# Patient Record
Sex: Male | Born: 1960 | Race: White | Hispanic: No | Marital: Single
Health system: Southern US, Community
[De-identification: ages and names within clinical notes are randomized; demographics above are authoritative.]

## PROBLEM LIST (undated history)

## (undated) DIAGNOSIS — M549 Dorsalgia, unspecified: Secondary | ICD-10-CM

## (undated) DIAGNOSIS — F1910 Other psychoactive substance abuse, uncomplicated: Secondary | ICD-10-CM

## (undated) DIAGNOSIS — Z972 Presence of dental prosthetic device (complete) (partial): Secondary | ICD-10-CM

## (undated) DIAGNOSIS — B1920 Unspecified viral hepatitis C without hepatic coma: Secondary | ICD-10-CM

## (undated) DIAGNOSIS — F329 Major depressive disorder, single episode, unspecified: Secondary | ICD-10-CM

## (undated) DIAGNOSIS — M5430 Sciatica, unspecified side: Secondary | ICD-10-CM

## (undated) DIAGNOSIS — S8263XA Displaced fracture of lateral malleolus of unspecified fibula, initial encounter for closed fracture: Secondary | ICD-10-CM

## (undated) DIAGNOSIS — F419 Anxiety disorder, unspecified: Secondary | ICD-10-CM

## (undated) DIAGNOSIS — R519 Headache, unspecified: Secondary | ICD-10-CM

## (undated) DIAGNOSIS — R51 Headache: Secondary | ICD-10-CM

## (undated) DIAGNOSIS — I639 Cerebral infarction, unspecified: Secondary | ICD-10-CM

## (undated) DIAGNOSIS — G8929 Other chronic pain: Secondary | ICD-10-CM

## (undated) DIAGNOSIS — F32A Depression, unspecified: Secondary | ICD-10-CM

## (undated) DIAGNOSIS — I2510 Atherosclerotic heart disease of native coronary artery without angina pectoris: Secondary | ICD-10-CM

## (undated) DIAGNOSIS — I10 Essential (primary) hypertension: Secondary | ICD-10-CM

## (undated) DIAGNOSIS — K08109 Complete loss of teeth, unspecified cause, unspecified class: Secondary | ICD-10-CM

## (undated) HISTORY — PX: KNEE ARTHROSCOPY: SHX127

## (undated) HISTORY — DX: Unspecified viral hepatitis C without hepatic coma: B19.20

## (undated) HISTORY — PX: ENDARTERECTOMY: SHX5162

## (undated) HISTORY — PX: APPENDECTOMY: SHX54

## (undated) HISTORY — PX: INGUINAL HERNIA REPAIR: SUR1180

## (undated) HISTORY — PX: LUMBAR LAMINECTOMY: SHX95

## (undated) HISTORY — PX: ORCHIOPEXY: SHX479

---
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
## 2000-05-25 ENCOUNTER — Encounter: Payer: Self-pay | Admitting: Emergency Medicine

## 2000-05-25 ENCOUNTER — Emergency Department (HOSPITAL_COMMUNITY): Admission: EM | Admit: 2000-05-25 | Discharge: 2000-05-25 | Payer: Self-pay | Admitting: Unknown Physician Specialty

## 2000-05-25 ENCOUNTER — Encounter: Payer: Self-pay | Admitting: *Deleted

## 2000-06-01 ENCOUNTER — Emergency Department (HOSPITAL_COMMUNITY): Admission: EM | Admit: 2000-06-01 | Discharge: 2000-06-01 | Payer: Self-pay | Admitting: Emergency Medicine

## 2000-11-09 ENCOUNTER — Emergency Department (HOSPITAL_COMMUNITY): Admission: EM | Admit: 2000-11-09 | Discharge: 2000-11-09 | Payer: Self-pay | Admitting: Emergency Medicine

## 2000-11-11 ENCOUNTER — Emergency Department (HOSPITAL_COMMUNITY): Admission: EM | Admit: 2000-11-11 | Discharge: 2000-11-11 | Payer: Self-pay | Admitting: Emergency Medicine

## 2005-09-09 ENCOUNTER — Emergency Department (HOSPITAL_COMMUNITY): Admission: EM | Admit: 2005-09-09 | Discharge: 2005-09-10 | Payer: Self-pay | Admitting: Emergency Medicine

## 2005-11-12 ENCOUNTER — Emergency Department (HOSPITAL_COMMUNITY): Admission: EM | Admit: 2005-11-12 | Discharge: 2005-11-12 | Payer: Self-pay | Admitting: Emergency Medicine

## 2008-07-25 ENCOUNTER — Emergency Department (HOSPITAL_COMMUNITY): Admission: EM | Admit: 2008-07-25 | Discharge: 2008-07-25 | Payer: Self-pay | Admitting: Emergency Medicine

## 2009-01-16 ENCOUNTER — Emergency Department (HOSPITAL_COMMUNITY): Admission: EM | Admit: 2009-01-16 | Discharge: 2009-01-16 | Payer: Self-pay | Admitting: Emergency Medicine

## 2010-09-29 LAB — URINALYSIS, ROUTINE W REFLEX MICROSCOPIC
Bilirubin Urine: NEGATIVE
Glucose, UA: NEGATIVE mg/dL
Hgb urine dipstick: NEGATIVE
Ketones, ur: NEGATIVE mg/dL
Nitrite: NEGATIVE
Protein, ur: NEGATIVE mg/dL
Specific Gravity, Urine: 1.015 (ref 1.005–1.030)
Urobilinogen, UA: 0.2 mg/dL (ref 0.0–1.0)
pH: 5 (ref 5.0–8.0)

## 2010-09-29 LAB — POCT I-STAT, CHEM 8
BUN: 7 mg/dL (ref 6–23)
Calcium, Ion: 1.09 mmol/L — ABNORMAL LOW (ref 1.12–1.32)
Chloride: 105 mEq/L (ref 96–112)
Creatinine, Ser: 1.2 mg/dL (ref 0.4–1.5)
Glucose, Bld: 114 mg/dL — ABNORMAL HIGH (ref 70–99)
HCT: 53 % — ABNORMAL HIGH (ref 39.0–52.0)
Hemoglobin: 18 g/dL — ABNORMAL HIGH (ref 13.0–17.0)
Potassium: 3.8 mEq/L (ref 3.5–5.1)
Sodium: 142 mEq/L (ref 135–145)
TCO2: 24 mmol/L (ref 0–100)

## 2010-11-28 ENCOUNTER — Emergency Department (HOSPITAL_COMMUNITY)
Admission: EM | Admit: 2010-11-28 | Discharge: 2010-11-28 | Disposition: A | Discharge status: Home / Self Care | Payer: Self-pay | Attending: Emergency Medicine | Admitting: Emergency Medicine

## 2010-11-28 DIAGNOSIS — M545 Low back pain, unspecified: Secondary | ICD-10-CM | POA: Insufficient documentation

## 2010-11-28 DIAGNOSIS — IMO0002 Reserved for concepts with insufficient information to code with codable children: Secondary | ICD-10-CM | POA: Insufficient documentation

## 2010-12-01 ENCOUNTER — Emergency Department (HOSPITAL_COMMUNITY): Payer: Self-pay

## 2010-12-01 ENCOUNTER — Inpatient Hospital Stay (HOSPITAL_COMMUNITY)
Admission: EM | Admit: 2010-12-01 | Discharge: 2010-12-03 | DRG: 491 | Disposition: A | Discharge status: Home / Self Care | Payer: Self-pay | Attending: Neurosurgery | Admitting: Neurosurgery

## 2010-12-01 DIAGNOSIS — M5126 Other intervertebral disc displacement, lumbar region: Principal | ICD-10-CM | POA: Diagnosis present

## 2010-12-01 DIAGNOSIS — F17200 Nicotine dependence, unspecified, uncomplicated: Secondary | ICD-10-CM | POA: Diagnosis present

## 2010-12-01 DIAGNOSIS — M47817 Spondylosis without myelopathy or radiculopathy, lumbosacral region: Secondary | ICD-10-CM | POA: Diagnosis present

## 2010-12-01 LAB — BASIC METABOLIC PANEL
Calcium: 9.3 mg/dL (ref 8.4–10.5)
GFR calc Af Amer: >60 mL/min (ref >60)
GFR calc non Af Amer: >60 mL/min (ref >60)
Glucose, Bld: 130 mg/dL — ABNORMAL HIGH (ref 70–99)
Potassium: 4.6 mEq/L (ref 3.5–5.1)
Sodium: 140 mEq/L (ref 135–145)

## 2010-12-01 LAB — DIFFERENTIAL
Basophils Absolute: 0 10*3/uL (ref 0.0–0.1)
Basophils Relative: 0 % (ref 0–1)
Eosinophils Absolute: 0.1 10*3/uL (ref 0.0–0.7)
Eosinophils Relative: 0 % (ref 0–5)
Monocytes Absolute: 0.2 10*3/uL (ref 0.1–1.0)
Monocytes Relative: 1 % — ABNORMAL LOW (ref 3–12)
Neutro Abs: 13 10*3/uL — ABNORMAL HIGH (ref 1.7–7.7)

## 2010-12-01 LAB — CBC
Hemoglobin: 18.1 g/dL — ABNORMAL HIGH (ref 13.0–17.0)
MCH: 33 pg (ref 26.0–34.0)
MCHC: 36.3 g/dL — ABNORMAL HIGH (ref 30.0–36.0)
Platelets: 240 10*3/uL (ref 150–400)
RDW: 12.8 % (ref 11.5–15.5)

## 2010-12-02 ENCOUNTER — Inpatient Hospital Stay (HOSPITAL_COMMUNITY): Payer: Self-pay

## 2010-12-02 LAB — MRSA PCR SCREENING: MRSA by PCR: NEGATIVE

## 2010-12-04 NOTE — Op Note (Signed)
NAMEKHARSON, RASMUSSON NO.:  [id number]  MEDICAL RECORD NO.:  [id number]  LOCATION:  3039                         FACILITY:  [facility]  PHYSICIAN:  Hewitt Shorts, M.D.DATE OF BIRTH:  11-21-1960  DATE OF PROCEDURE:  12/02/2010 DATE OF DISCHARGE:                              OPERATIVE REPORT   PREOPERATIVE DIAGNOSES:  Recurrent left L5 lumbar disk herniation, lumbar degenerative disk disease, lumbar spondylosis and lumbar radiculopathy.  POSTOPERATIVE DIAGNOSES:  Recurrent left L5 lumbar disk herniation, lumbar degenerative disk disease, lumbar spondylosis and lumbar radiculopathy.  PROCEDURES:  Inferior left L4 hemilaminotomy and complete left L5 hemilaminectomy with microdiskectomy with microdissection under microsurgical technique with decompression of the exiting left L5 and S1 nerve roots.  SURGEON:  Hewitt Shorts, MD  ASSISTANT:  Hilda Lias, MD  ANESTHESIA:  General endotracheal.  INDICATIONS:  The patient is a 50 year old man who had a previous lumbar diskectomy done at the [facility] 11 years ago.  He developed recurrent radicular pain over the past couple of weeks, it has become steadily more severe.  He was found by MRI scan to have a large disk herniation with a free fragment behind the body of L5 on the left side.  There was extensive postsurgical changes in the left L4-5 level and possible previous laminotomy at the L5-S1 level.  Decision was made to proceed with decompression.  PROCEDURE IN DETAILS:  The patient was brought to the operating room and placed under general endotracheal anesthesia.  The patient was turned to a prone position.  Lumbar region was prepped with Betadine soap and solution and draped in a sterile fashion.  The midline was infiltrated with local anesthetic with epinephrine and then the previous midline incision was reopened and dissection was carried down through the subcutaneous  tissue.  Bipolar cautery and electrocautery were used to maintain hemostasis.  Dissection was carried down to the lumbar fascia which was sized on the left side of the midline and the paraspinal muscle was dissected from the spinous process and lamina in subperiosteal fashion.  There was extensive scar tissue in the interlaminar space, worse at L4-5 and L5-S1.  However, we were able to define the lamina, spinous process, L4, L5 and S1.  An x-ray was taken and the L4-5 and L5-S1 interlaminar space was confirmed.  The operating microscope was then draped and brought into the field to provide additional navigation, illumination and visualization and the remainder of decompression performed using microdissection under microsurgical technique.  We were able to dissect the scar tissue and ligamentum flavum from the margins of previous laminotomy and laminar edges and then using the high-speed drill and Kerrison punches, we proceeded with an inferior left L4 hemilaminotomy and a complete left L5 hemilaminectomy.  We began to remove the remaining ligamentum flavum at the L5-S1 level and then extended the hemilaminectomy rostrally, identified the thecal sac, the exiting left S1 nerve root and the exiting left L5 nerve roots.  We then dissected the scar tissue at the left L4-5 laminotomy from the thecal sac and defined the thecal sac rostral to the L4-5 disk space level.  We then found a very large free fragment  caudal to the left L5 nerve roots.  It was removed in several large fragments and good decompression of thecal sac and nerve root was achieved.  We examined the left L5 nerve root, it has been well decompressed the L5-S1 disk itself, although degenerated, did not have any evidence of herniation.  We then began to explore the epidural space at the left L4-5 level.  Remaining disk herniation was removed and we proceeded to incise the annulus and scar of the L4-5 disk and entered into the  disk space, proceeded with a thorough diskectomy using a variety of pituitary rongeurs and micro curettes.  Thorough diskectomy was performed.  We were then removed thickened ligamentum flavum and scar tissue from the posterior aspect of the vertebral body at L4 and L5 and it was felt that good decompression of the thecal sac and L5 and S1 nerve roots have been achieved on the left side.  All loose fragments of the disk material were removed from the disk space and the epidural space.  Once decompression was completed, hemostasis was established with the use of Gelfoam soaked in thrombin.  The wound was irrigated with bacitracin solution.  The Gelfoam removed.  Good hemostasis confirmed and then we instilled 2 mL of fentanyl and 80 mg of Depo-Medrol into the epidural space and proceed with closure.  Deep fascia was closed with interrupted undyed 1 Vicryl sutures.  Scarpa fascia was closed with interrupted undyed 1 Vicryl sutures.  Subcutaneous and subcuticular closed with inverted 2-0 undyed Vicryl sutures and the skin was approximated with Dermabond.  The procedure was tolerated well.  The estimated blood loss was 50 mL.  Sponge and needle count were correct. Following surgery, the patient was turned back to supine position to be reversed in the anesthetic, extubated, was transferred to the recovery room for further care.     Hewitt Shorts, M.D.     RWN/MEDQ  D:  12/02/2010  T:  12/03/2010  Job:  [id number]  Electronically Signed by Shirlean Kelly M.D. on 12/04/2010 10:48:02 AM

## 2010-12-04 NOTE — H&P (Signed)
NAMEGLENWOOD, REVOIR NO.:  [id number]  MEDICAL RECORD NO.:  [id number]  LOCATION:  3039                         FACILITY:  [facility]  PHYSICIAN:  Hewitt Shorts, M.D.DATE OF BIRTH:  1961-02-17  DATE OF ADMISSION:  12/01/2010 DATE OF DISCHARGE:                             HISTORY & PHYSICAL   HISTORY OF PRESENT ILLNESS:  The patient is a 50 year old white male who presented to [facility] Emergency Room with disabling left lumbar radiculopathy.  He had presented 3 days ago with the same complaints.  Today, the emergency room physicians and physician assistant obtained an MRI scan of the lumbar spine which revealed a large (recurrent) left L5 lumbar disk herniation.  The disk herniation extends from the L4-5 disk level to the L5-S1 disk level and the MRI suggested previous laminotomy on the left side of L4-5.  Notably, his history includes a left lumbar diskectomy in 2001 at [facility].  No records are available regarding that condition.  The patient explains that he had some mild chronic back pain for years. His current difficulties now began 2 weeks ago with worsening of his pain; however, he developed acute severe left lumbar radicular pain on June 7.  He came to the emergency room.  He was seen by the emergency room physician, prescribed Percocet and referred to a couple of primary care practices.  He contacted them, neither of them would give him an appointment.  He ran out of his Percocet yesterday.  He returned to the emergency room because of pain, worsened again today and his MRI was done and neurosurgical consultation was requested.  He describes pain which is mild in the left side of low back but severe extending from the left buttock, down to left lateral thigh and leg.  He has numbness in the lateral left leg and into the dorsal left foot and into the left great toe.  PAST MEDICAL HISTORY:  No history of  hypertension, cancer, myocardial function, peptic ulcer disease, stroke, lung disease or diabetes.  PREVIOUS SURGERY:  Includes his lumbar diskectomy in 2001, right inguinal herniorrhaphy on 2 occasions with an orchiopexy and appendectomy.  ALLERGIES:  He denies allergies to medications.  MEDICATIONS:  He is on no chronic medications, but did use Percocet prescribed from the emergency room on June 7 until it ran out yesterday.  FAMILY HISTORY:  Mother is alive at age 44 with diabetes.  Father died at age 53 and had ruptured abdominal aortic aneurysm.  He had a remote history of peptic ulcer disease.  SOCIAL HISTORY:  The patient is twice divorced and currently unmarried. He smokes one and a half packs of cigarettes per day and has been smoking for over 30 years.  He drinks alcohol beverages occasionally, now he drank heavily in the past.  He does not work.  REVIEW OF SYSTEMS:  Notable for those described in his history of present illness, past medical history but is otherwise unremarkable.  He does not have any HEENT complaints.  No cardiopulmonary, abdominal, gastro, or urologic complaints.  No integument or psychological complaints.  PHYSICAL EXAM:  GENERAL:  The patient is well-developed, well-nourished white  male, in discomfort but in no acute distress. VITAL SIGNS:  Temperature is 97.8, pulse 69, blood pressure 144/86. LUNGS:  Clear to auscultation.  He has symmetric respiratory excursion. HEART:  Regular rate and rhythm, normal S1 and S2.  There is no murmur. EXTREMITIES:  Mild clubbing.  There is no cyanosis or edema. MUSCULOSKELETAL:  Positive straight leg raise on the left at about 20 degrees, negative straight leg raising on the right.  He has tenderness in the lower lumbar region diffusely without specific point tenderness. He found it difficult to lie on his back and it is most comfortable lying on his left side down. NEUROLOGIC:  Motor exam 5/5 strength in the  dorsiflexors, extensor hallucis longus, and plantar flexor bilaterally.  Sensation is decreased to pinprick in the lateral aspect of left leg and into the medial aspect of the dorsum of the left foot.  Reflexes are absent in the quadriceps and gastrocnemius.  They are symmetrical bilaterally.  Toes are downgoing bilaterally.  Gait and stance were not tested due to the severity of his pain.  IMPRESSION:  Left lumbar radiculopathy leading to a recurrent left L5 lumbar disk herniation with underlying degenerative disk disease and spondylosis seen on the MRI.  Intact strength but significant new diminished sensation at the distal left lower extremity.  PLAN:  The patient will be admitted for pain management.  He will be taken to surgery tomorrow for a left L5 hemilaminectomy and microdiskectomy.  I discussed the nature of his condition, the nature of the MRI findings and the nature of surgical procedure and the alternatives to surgery that is living with the severe disabling pain and discomfort.  We discussed the nature of surgical procedure, typical risks of surgery, hospital stay, and recuperation and we discussed risks including risk of infection, bleeding, possibly need for transfusion, risk of neurologic dysfunction, pain, weakness, numbness, paresthesias, the risk of dural tear, CSF leakage, possible need for further surgery, risk of recurrent disk herniation, and possible need for further surgery, anesthetic risk of myocardial function, stroke, pneumonia, and death.  Understand all this.  He would like to proceed with surgery as soon as possible and we will plan on admitting him and proceeding with surgery tomorrow.  Orders have been written including orders for VTE prophylaxis.     Hewitt Shorts, M.D.     RWN/MEDQ  D:  12/01/2010  T:  12/02/2010  Job:  [id number]  Electronically Signed by Shirlean Kelly M.D. on 12/04/2010 10:48:00 AM

## 2011-01-06 ENCOUNTER — Inpatient Hospital Stay (INDEPENDENT_AMBULATORY_CARE_PROVIDER_SITE_OTHER)
Admission: RE | Admit: 2011-01-06 | Discharge: 2011-01-06 | Disposition: A | Discharge status: Home / Self Care | Payer: Self-pay | Source: Ambulatory Visit | Attending: Emergency Medicine | Admitting: Emergency Medicine

## 2011-01-06 DIAGNOSIS — M549 Dorsalgia, unspecified: Secondary | ICD-10-CM

## 2011-04-09 ENCOUNTER — Emergency Department (HOSPITAL_COMMUNITY): Payer: Self-pay

## 2011-04-09 ENCOUNTER — Emergency Department (HOSPITAL_COMMUNITY)
Admission: EM | Admit: 2011-04-09 | Discharge: 2011-04-09 | Disposition: A | Discharge status: Home / Self Care | Payer: Self-pay | Attending: Emergency Medicine | Admitting: Emergency Medicine

## 2011-04-09 DIAGNOSIS — R42 Dizziness and giddiness: Secondary | ICD-10-CM | POA: Insufficient documentation

## 2011-04-09 DIAGNOSIS — J40 Bronchitis, not specified as acute or chronic: Secondary | ICD-10-CM | POA: Insufficient documentation

## 2011-04-09 DIAGNOSIS — D45 Polycythemia vera: Secondary | ICD-10-CM | POA: Insufficient documentation

## 2011-04-09 DIAGNOSIS — H9209 Otalgia, unspecified ear: Secondary | ICD-10-CM | POA: Insufficient documentation

## 2011-04-09 DIAGNOSIS — J3489 Other specified disorders of nose and nasal sinuses: Secondary | ICD-10-CM | POA: Insufficient documentation

## 2011-04-09 DIAGNOSIS — J029 Acute pharyngitis, unspecified: Secondary | ICD-10-CM | POA: Insufficient documentation

## 2011-04-09 DIAGNOSIS — IMO0001 Reserved for inherently not codable concepts without codable children: Secondary | ICD-10-CM | POA: Insufficient documentation

## 2011-04-09 DIAGNOSIS — R059 Cough, unspecified: Secondary | ICD-10-CM | POA: Insufficient documentation

## 2011-04-09 DIAGNOSIS — R05 Cough: Secondary | ICD-10-CM | POA: Insufficient documentation

## 2011-04-09 DIAGNOSIS — R109 Unspecified abdominal pain: Secondary | ICD-10-CM | POA: Insufficient documentation

## 2011-04-09 LAB — DIFFERENTIAL
Basophils Absolute: 0 10*3/uL (ref 0.0–0.1)
Basophils Relative: 0 % (ref 0–1)
Eosinophils Absolute: 0.2 10*3/uL (ref 0.0–0.7)
Monocytes Relative: 8 % (ref 3–12)
Neutro Abs: 9.1 10*3/uL — ABNORMAL HIGH (ref 1.7–7.7)
Neutrophils Relative %: 69 % (ref 43–77)

## 2011-04-09 LAB — COMPREHENSIVE METABOLIC PANEL
ALT: 25 U/L (ref 0–53)
AST: 21 U/L (ref 0–37)
Alkaline Phosphatase: 104 U/L (ref 39–117)
Calcium: 10 mg/dL (ref 8.4–10.5)
Potassium: 4.1 mEq/L (ref 3.5–5.1)
Sodium: 135 mEq/L (ref 135–145)
Total Protein: 7.9 g/dL (ref 6.0–8.3)

## 2011-04-09 LAB — CBC
MCH: 33.8 pg (ref 26.0–34.0)
MCHC: 37 g/dL — ABNORMAL HIGH (ref 30.0–36.0)
Platelets: 228 10*3/uL (ref 150–400)
RBC: 5.71 MIL/uL (ref 4.22–5.81)

## 2011-04-09 LAB — URINALYSIS, ROUTINE W REFLEX MICROSCOPIC
Glucose, UA: NEGATIVE mg/dL
Hgb urine dipstick: NEGATIVE
Specific Gravity, Urine: 1.01 (ref 1.005–1.030)
pH: 6 (ref 5.0–8.0)

## 2011-07-24 ENCOUNTER — Emergency Department (HOSPITAL_COMMUNITY)
Admission: EM | Admit: 2011-07-24 | Discharge: 2011-07-24 | Disposition: A | Discharge status: Home / Self Care | Payer: Self-pay | Attending: Emergency Medicine | Admitting: Emergency Medicine

## 2011-07-24 ENCOUNTER — Encounter (HOSPITAL_COMMUNITY): Payer: Self-pay

## 2011-07-24 ENCOUNTER — Emergency Department (HOSPITAL_COMMUNITY): Payer: Self-pay

## 2011-07-24 DIAGNOSIS — L989 Disorder of the skin and subcutaneous tissue, unspecified: Secondary | ICD-10-CM | POA: Insufficient documentation

## 2011-07-24 DIAGNOSIS — M25549 Pain in joints of unspecified hand: Secondary | ICD-10-CM | POA: Insufficient documentation

## 2011-07-24 DIAGNOSIS — M79609 Pain in unspecified limb: Secondary | ICD-10-CM | POA: Insufficient documentation

## 2011-07-24 DIAGNOSIS — F17200 Nicotine dependence, unspecified, uncomplicated: Secondary | ICD-10-CM | POA: Insufficient documentation

## 2011-07-24 DIAGNOSIS — W268XXA Contact with other sharp object(s), not elsewhere classified, initial encounter: Secondary | ICD-10-CM | POA: Insufficient documentation

## 2011-07-24 DIAGNOSIS — M7989 Other specified soft tissue disorders: Secondary | ICD-10-CM | POA: Insufficient documentation

## 2011-07-24 DIAGNOSIS — S61209A Unspecified open wound of unspecified finger without damage to nail, initial encounter: Secondary | ICD-10-CM | POA: Insufficient documentation

## 2011-07-24 DIAGNOSIS — S61219A Laceration without foreign body of unspecified finger without damage to nail, initial encounter: Secondary | ICD-10-CM

## 2011-07-24 DIAGNOSIS — M25449 Effusion, unspecified hand: Secondary | ICD-10-CM | POA: Insufficient documentation

## 2011-07-24 MED ORDER — TETANUS-DIPHTH-ACELL PERTUSSIS 5-2.5-18.5 LF-MCG/0.5 IM SUSP
0.5000 mL | Freq: Once | INTRAMUSCULAR | Status: AC
  Administered 2011-07-24: 0.5 mL via INTRAMUSCULAR
  Filled 2011-07-24 (×2): qty 0.5

## 2011-07-24 MED ORDER — CEPHALEXIN 250 MG PO CAPS
500.0000 mg | ORAL_CAPSULE | Freq: Once | ORAL | Status: AC
  Administered 2011-07-24: 500 mg via ORAL
  Filled 2011-07-24: qty 2

## 2011-07-24 MED ORDER — CEPHALEXIN 500 MG PO CAPS: 500.0000 mg | ORAL_CAPSULE | Freq: Four times a day (QID) | ORAL | Status: AC

## 2011-07-24 MED ORDER — OXYCODONE-ACETAMINOPHEN 5-325 MG PO TABS: 1.0000 | ORAL_TABLET | Freq: Four times a day (QID) | ORAL | Status: AC | PRN

## 2011-07-24 MED ORDER — OXYCODONE-ACETAMINOPHEN 5-325 MG PO TABS
1.0000 | ORAL_TABLET | Freq: Once | ORAL | Status: AC
  Administered 2011-07-24: 1 via ORAL
  Filled 2011-07-24: qty 1

## 2011-07-24 NOTE — ED Notes (Signed)
Pt was removing a part of a car and hit his left index finger with a wrench causing a laceration that bled heavily yesterday. Today pt has had continued pain and swelling to the knuckle of the left index finger.

## 2011-07-24 NOTE — ED Notes (Signed)
MD at bedside. 

## 2011-07-24 NOTE — Progress Notes (Signed)
Orthopedic Tech Progress Note Patient Details:  Brian Huynh 1961-01-26 [id number]   finger splint    Cammer, Mickie Bail 07/24/2011, 11:50 AM

## 2011-07-24 NOTE — ED Notes (Signed)
Pt has laceration to his left first finger.  Pt states that he cut same was yesterday.  Bleeding controlled. Pt states that he feels like his bone is aching.  Pt states that he thinks he saw his bone when he cut his finger.  Family states that he bled for 12 hours yesterday.

## 2011-07-24 NOTE — ED Provider Notes (Signed)
History     CSN: [id number]  Arrival date & time 07/24/11  1009   First MD Initiated Contact with Patient 07/24/11 1016      Chief Complaint  Patient presents with  . Laceration    (Consider location/radiation/quality/duration/timing/severity/associated sxs/prior treatment) HPI Comments: 51 y.o male present to ED with R index finger pain sustained from a laceration at 2 pm yesterday.  He was turning a wrench when his hand slipped off and cut his finger on a piece of metal.  The laceration was bleeding moderately for 4 hours and continued to bleed for 8-12 hours.  He reports oil debris in the wound but no metal.  The finger is painful at 7/10, throbbing, and swollen.  Denies fever, numbness/tingling/weakness of all fingers and thumb. He is not taking ASA. No tetanus in previous 5 years.   Patient is a 51 y.o. male presenting with skin laceration. The history is provided by the patient.  Laceration  The incident occurred 12 to 24 hours ago. The laceration is 1 cm in size. The laceration mechanism was a a metal edge. The pain is moderate. He reports no foreign bodies present. His tetanus status is out of date.    History reviewed. No pertinent past medical history.  Past Surgical History  Procedure Date  . Back surgery     History reviewed. No pertinent family history.  History  Substance Use Topics  . Smoking status: Current Some Day Smoker -- 1.0 packs/day  . Smokeless tobacco: Not on file  . Alcohol Use: Yes      Review of Systems  Constitutional: Negative for fever.  HENT: Negative for neck pain.   Musculoskeletal: Positive for joint swelling and arthralgias.  Skin: Positive for color change and wound. Negative for pallor and rash.  Neurological: Negative for dizziness, syncope, weakness and numbness.  Hematological: Negative for adenopathy.    Allergies  Review of patient's allergies indicates no known allergies.  Home Medications  No current outpatient  prescriptions on file.  BP 165/96  Pulse 88  Temp(Src) 97.5 F (36.4 C) (Oral)  Resp 20  SpO2 99%  Physical Exam  Nursing note and vitals reviewed. Constitutional: He is oriented to person, place, and time. He appears well-developed and well-nourished.  HENT:  Head: Normocephalic and atraumatic.  Eyes: Conjunctivae are normal.  Neck: Normal range of motion. Neck supple.  Cardiovascular: Normal pulses.   Musculoskeletal: He exhibits tenderness. He exhibits no edema.       Left hand: He exhibits decreased range of motion, tenderness, laceration and swelling. He exhibits no bony tenderness, normal two-point discrimination, normal capillary refill and no deformity. normal sensation noted. Normal strength noted.       Hands:      Swelling of dorsal aspect of L index finger.  TTP over dorsal aspect of finger and MP base.  No tenderness through 2nd metacarpal/hand.  2cm oblique laceration on the dorsal surface over the 2nd PIP joint.  There is mild erythema of the dorsal aspect of finger distal to the PIP joint.  Decreased terminal flexion and terminal extension at DIP due to swelling and pain.  Strength is normal however with resistance in flexion/extension. Neuro and vascular intact.  There is no fusiform swelling over extensor tendon or pain with palpation over any portion of the flexor tendon.   Neurological: He is alert and oriented to person, place, and time. No sensory deficit.  Skin: Skin is warm and dry.  Psychiatric: He has a normal  mood and affect.    ED Course  Procedures (including critical care time)  Labs Reviewed - No data to display Dg Finger Index Left  07/24/2011  *RADIOLOGY REPORT*  Clinical Data: Injury with joint pain and laceration.  LEFT INDEX FINGER 2+V  Comparison: None.  Findings: No acute osseous or joint abnormality.  IMPRESSION: No acute osseous or joint abnormality.  No radiopaque foreign body.  Original Report Authenticated By: Reyes Ivan, M.D.      1. Laceration of finger    Patient was seen and examined.   Digital block was performed by myself. Area of injection prepped with alcohol followed by iodine swab. 2cc 2% lidocaine was injected into the flexor tendon sheath at the base of L 2nd finger using 27g needle. Patient had temporary (approx ) color change in index finger to red/purple hue likely secondary to pressure exerted by injection of lidocaine. This completely resolved with massage of base of digit and adequate anesthesia was achieved. Wound was well scrubbed/cleaned with dermal wound cleanser and gauze. Due to wound healing, wound cannot be fully opened and explored. X-ray was negative for bony involvement and radioopaque foreign body.   The patient was given pain medication in emergency department. Patient was instructed to followup with orthopedic hand surgeon for evaluation of his healing wound. Antibiotics given in emergency department. Patient urged to return to the emergency department with worsening pain, swelling, streaking of redness up his hand and arm, pus draining from the wound, or if he has any other concerns. Instructed to return in 48 hours if no improvement.  Patient verbalized understanding and agreed with plan.  Finger splint placed by orthopedic tech.  Patient counseled on use of narcotic pain medications. Counseled not to combine these medications with others containing tylenol. Urged not to drink alcohol, drive, or perform any other activities that requires focus while taking these medications. The patient verbalizes understanding and agrees with the plan.   MDM  Patient with laceration over dorsal aspect of PIP joint left index finger. Pain controlled in the emergency department. Mild erythema dorsally and distally of the finger concerning for possible cellulitis. Antibiotics prescribed. No concern for flexor tenosynovitis. No foreign body noted on x-ray. Patient has good strength in finger with  mildly limited range of motion assumed due to swelling and pain. Wound cannot be fully explored it due to wound healing so cannot completely exclude partial extensor tendon injury. Wound cleaned. Orthopedic hand referral given. Tetanus shot updated.       Carolee Rota, [place] 07/24/11 1239

## 2011-07-26 NOTE — ED Provider Notes (Signed)
Medical screening examination/treatment/procedure(s) were performed by non-physician practitioner and as supervising physician I was immediately available for consultation/collaboration.   Celene Kras, MD 07/26/11 [phone]

## 2012-03-08 ENCOUNTER — Encounter (HOSPITAL_COMMUNITY): Payer: Self-pay | Admitting: Emergency Medicine

## 2012-03-08 ENCOUNTER — Emergency Department (HOSPITAL_COMMUNITY): Payer: Self-pay

## 2012-03-08 ENCOUNTER — Emergency Department (HOSPITAL_COMMUNITY)
Admission: EM | Admit: 2012-03-08 | Discharge: 2012-03-08 | Disposition: A | Discharge status: Home / Self Care | Payer: Self-pay | Attending: Emergency Medicine | Admitting: Emergency Medicine

## 2012-03-08 DIAGNOSIS — F17200 Nicotine dependence, unspecified, uncomplicated: Secondary | ICD-10-CM | POA: Insufficient documentation

## 2012-03-08 DIAGNOSIS — M549 Dorsalgia, unspecified: Secondary | ICD-10-CM | POA: Insufficient documentation

## 2012-03-08 DIAGNOSIS — G8929 Other chronic pain: Secondary | ICD-10-CM | POA: Insufficient documentation

## 2012-03-08 HISTORY — DX: Sciatica, unspecified side: M54.30

## 2012-03-08 HISTORY — DX: Dorsalgia, unspecified: M54.9

## 2012-03-08 HISTORY — DX: Other chronic pain: G89.29

## 2012-03-08 MED ORDER — METHOCARBAMOL 500 MG PO TABS: 1000.0000 mg | ORAL_TABLET | Freq: Four times a day (QID) | ORAL | Status: DC | PRN

## 2012-03-08 MED ORDER — OXYCODONE-ACETAMINOPHEN 5-325 MG PO TABS: ORAL_TABLET | ORAL | Status: DC

## 2012-03-08 MED ORDER — OXYCODONE-ACETAMINOPHEN 5-325 MG PO TABS
2.0000 | ORAL_TABLET | Freq: Once | ORAL | Status: AC
  Administered 2012-03-08: 2 via ORAL
  Filled 2012-03-08: qty 2

## 2012-03-08 MED ORDER — NAPROXEN 250 MG PO TABS: 250.0000 mg | ORAL_TABLET | Freq: Two times a day (BID) | ORAL | Status: DC

## 2012-03-08 NOTE — ED Notes (Signed)
Patient C/O severe back pain.  States that it is making his left leg hurt all the way down to his foot.  States that the pain is constant and worsens with movement. C/O numbness and tingling in his left leg.
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
## 2012-05-26 ENCOUNTER — Emergency Department (HOSPITAL_COMMUNITY)
Admission: EM | Admit: 2012-05-26 | Discharge: 2012-05-26 | Disposition: A | Discharge status: Home / Self Care | Payer: Self-pay | Attending: Emergency Medicine | Admitting: Emergency Medicine

## 2012-05-26 ENCOUNTER — Encounter (HOSPITAL_COMMUNITY): Payer: Self-pay | Admitting: Emergency Medicine

## 2012-05-26 DIAGNOSIS — F17200 Nicotine dependence, unspecified, uncomplicated: Secondary | ICD-10-CM | POA: Insufficient documentation

## 2012-05-26 DIAGNOSIS — IMO0002 Reserved for concepts with insufficient information to code with codable children: Secondary | ICD-10-CM | POA: Insufficient documentation

## 2012-05-26 DIAGNOSIS — G8929 Other chronic pain: Secondary | ICD-10-CM | POA: Insufficient documentation

## 2012-05-26 DIAGNOSIS — R209 Unspecified disturbances of skin sensation: Secondary | ICD-10-CM | POA: Insufficient documentation

## 2012-05-26 DIAGNOSIS — M5416 Radiculopathy, lumbar region: Secondary | ICD-10-CM

## 2012-05-26 DIAGNOSIS — M5430 Sciatica, unspecified side: Secondary | ICD-10-CM | POA: Insufficient documentation

## 2012-05-26 DIAGNOSIS — Z9889 Other specified postprocedural states: Secondary | ICD-10-CM | POA: Insufficient documentation

## 2012-05-26 MED ORDER — PREDNISONE 20 MG PO TABS: ORAL_TABLET | ORAL | Status: DC

## 2012-05-26 MED ORDER — OXYCODONE-ACETAMINOPHEN 5-325 MG PO TABS: 2.0000 | ORAL_TABLET | Freq: Four times a day (QID) | ORAL | Status: DC | PRN

## 2012-05-26 NOTE — ED Notes (Signed)
Pt ambulatory leaving ED. Pt given d/c teaching, prescriptions and resource information for PCP. Pt has no further questions upon d/c. Pt does not appear to be in any acute distress upon d/c.

## 2012-05-26 NOTE — ED Provider Notes (Signed)
History   This chart was scribed for Hurman Horn, MD by Gerlean Ren, ED Scribe. This patient was seen in room TR09C/TR09C and the patient's care was started at 12:38 PM    CSN: [id number]  Arrival date & time 05/26/12  1105   First MD Initiated Contact with Patient 05/26/12 1237      Chief Complaint  Patient presents with  . Back Pain    The history is provided by the patient. No language interpreter was used.   Brian Huynh is a 51 y.o. male with h/o chronic back pain and sciatica who presents to the Emergency Department complaining of one week of constant lower back pain radiating down left leg with no trauma or injury as cause that is worsened by movement.  Pt reports associated one week of weakness and numbness in left leg.  Pt denies incontinence, fever, IV drug use, h/o cancer, and h/o HIV.  Pt reports tobacco and alcohol use.   Past Medical History  Diagnosis Date  . Chronic back pain   . Sciatica     Past Surgical History  Procedure Date  . Back surgery   . Appendectomy   . Orchiopexy     History reviewed. No pertinent family history.  History  Substance Use Topics  . Smoking status: Current Some Day Smoker -- 1.0 packs/day  . Smokeless tobacco: Not on file  . Alcohol Use: Yes      Review of Systems 10 Systems reviewed and are negative for acute change except as noted in the HPI.  Allergies  Review of patient's allergies indicates no known allergies.  Home Medications   Current Outpatient Rx  Name  Route  Sig  Dispense  Refill  . IBUPROFEN 200 MG PO TABS   Oral   Take 600 mg by mouth every 6 (six) hours as needed. For pain         . OXYCODONE-ACETAMINOPHEN 5-325 MG PO TABS   Oral   Take 2 tablets by mouth every 6 (six) hours as needed for pain.   30 tablet   0   . PREDNISONE 20 MG PO TABS      3 tabs po day one, then 2 po daily x 4 days   11 tablet   0     BP 159/105  Pulse 77  Temp 98.3 F (36.8 C) (Oral)  Resp 20  Ht 6'  (1.829 m)  Wt 180 lb (81.647 kg)  BMI 24.41 kg/m2  SpO2 99%  Physical Exam  Nursing note and vitals reviewed. Constitutional:       Awake, alert, nontoxic appearance with baseline speech.  HENT:  Head: Atraumatic.  Eyes: Pupils are equal, round, and reactive to light. Right eye exhibits no discharge. Left eye exhibits no discharge.  Neck: Neck supple.  Cardiovascular: Normal rate and regular rhythm.   No murmur heard. Pulmonary/Chest: Effort normal and breath sounds normal. No respiratory distress. He has no wheezes. He has no rales. He exhibits no tenderness.  Abdominal: Soft. Bowel sounds are normal. He exhibits no mass. There is no tenderness. There is no rebound.  Musculoskeletal: He exhibits no edema.       Thoracic back: He exhibits no tenderness.       Lumbar back: He exhibits no tenderness.       Bilateral lower extremities non tender without new rashes or color change, baseline ROM with intact DP / PT pulses, CR<2 secs all digits bilaterally, sensation baseline light touch  bilaterally for pt, DTR's symmetric and intact bilaterally KJ / AJ, motor symmetric bilateral 5 / 5 hip flexion, quadriceps, hamstrings, right EHL, right foot dorsiflexion, right foot plantarflexion. left foot EHL, left foot dorsiflexion, and left foot plantarflexion all 4/5. Diffuse lumbar and para-lumbar tenderness, no rash noted on back, left lateral thigh decreased sensation, left lateral lower leg numb, left medial foot has numbness and left lateral foot has slight numbness,   Neurological:       Mental status baseline for patient.  Upper extremity motor strength and sensation intact and symmetric bilaterally.  Skin: No rash noted.  Psychiatric: He has a normal mood and affect.    ED Course  Procedures (including critical care time) DIAGNOSTIC STUDIES: Oxygen Saturation is 99% on room air, normal by my interpretation.    COORDINATION OF CARE: 12:46 PM- Patient informed of clinical course,  understands medical decision-making process, and agrees with plan.  Advised follow-up with neurosurgery to determine further action.  Labs Reviewed - No data to display No results found.   1. Lumbar radiculopathy, acute       MDM   Pt stable in ED with no significant deterioration in condition.  Patient / Family / Caregiver informed of clinical course, understand medical decision-making process, and agree with plan.  I doubt any other EMC precluding discharge at this time including, but not necessarily limited to the following:SBI, cauda equina.  I personally performed the services described in this documentation, which was scribed in my presence. The recorded information has been reviewed and is accurate.      Hurman Horn, MD 05/31/12 [phone]

## 2012-05-26 NOTE — ED Notes (Signed)
Pt states increased pain in back since Thursday; Pt states pain in left lower back; pt states numbness and tingling in left knee and left foot. Pt mentating appropriately. Pt denies chest pain and shortness of breath, pt denies dizziness and lightheadedness.

## 2012-05-26 NOTE — ED Notes (Signed)
Pt c/o lower back pain with radiation down left leg x 1 week; pt denies obvious injury 

## 2012-05-26 NOTE — ED Notes (Signed)
Dr. Gari Crown at bedside.

## 2012-06-14 ENCOUNTER — Emergency Department (HOSPITAL_COMMUNITY)
Admission: EM | Admit: 2012-06-14 | Discharge: 2012-06-14 | Disposition: A | Discharge status: Home / Self Care | Payer: Self-pay | Attending: Emergency Medicine | Admitting: Emergency Medicine

## 2012-06-14 ENCOUNTER — Encounter (HOSPITAL_COMMUNITY): Payer: Self-pay | Admitting: Emergency Medicine

## 2012-06-14 DIAGNOSIS — Z791 Long term (current) use of non-steroidal anti-inflammatories (NSAID): Secondary | ICD-10-CM | POA: Insufficient documentation

## 2012-06-14 DIAGNOSIS — M545 Low back pain, unspecified: Secondary | ICD-10-CM | POA: Insufficient documentation

## 2012-06-14 DIAGNOSIS — M549 Dorsalgia, unspecified: Secondary | ICD-10-CM

## 2012-06-14 DIAGNOSIS — F17200 Nicotine dependence, unspecified, uncomplicated: Secondary | ICD-10-CM | POA: Insufficient documentation

## 2012-06-14 DIAGNOSIS — M5430 Sciatica, unspecified side: Secondary | ICD-10-CM | POA: Insufficient documentation

## 2012-06-14 DIAGNOSIS — G8929 Other chronic pain: Secondary | ICD-10-CM | POA: Insufficient documentation

## 2012-06-14 MED ORDER — OXYCODONE-ACETAMINOPHEN 5-325 MG PO TABS: 1.0000 | ORAL_TABLET | Freq: Four times a day (QID) | ORAL | Status: DC | PRN

## 2012-06-14 MED ORDER — CYCLOBENZAPRINE HCL 10 MG PO TABS: 10.0000 mg | ORAL_TABLET | Freq: Three times a day (TID) | ORAL | Status: DC | PRN

## 2012-06-14 MED ORDER — HYDROMORPHONE HCL PF 2 MG/ML IJ SOLN
2.0000 mg | Freq: Once | INTRAMUSCULAR | Status: AC
  Administered 2012-06-14: 2 mg via INTRAMUSCULAR
  Filled 2012-06-14: qty 1

## 2012-06-14 MED ORDER — IBUPROFEN 800 MG PO TABS: 800.0000 mg | ORAL_TABLET | Freq: Three times a day (TID) | ORAL | Status: DC | PRN

## 2012-06-14 MED ORDER — KETOROLAC TROMETHAMINE 60 MG/2ML IM SOLN
60.0000 mg | Freq: Once | INTRAMUSCULAR | Status: AC
  Administered 2012-06-14: 60 mg via INTRAMUSCULAR
  Filled 2012-06-14: qty 2

## 2012-06-14 NOTE — ED Notes (Signed)
Hx of back sx x 2. Last with Dr. Newell Coral 1.5 yrs ago. Has not called his office. Doesn't have any money. Pain worse x 2 weeks. Pain radiates to left leg. Denies problems with bowel and urine.

## 2012-06-14 NOTE — ED Provider Notes (Signed)
History   This chart was scribed for Charles B. Bernette Mayers, MD, by Frederik Pear, ER scribe. The patient was seen in room TR06C/TR06C and the patient's care was started at 1437.    CSN: [id number]  Arrival date & time 06/14/12  1422   First MD Initiated Contact with Patient 06/14/12 1437      Chief Complaint  Patient presents with  . Back Pain    (Consider location/radiation/quality/duration/timing/severity/associated sxs/prior treatment) HPI Brian Huynh is a 51 y.o. male with a h/o of chronic back pain who presents to the Emergency Department complaining of constant, gradually worsening back pain that radiates to his buttocks and left leg that began a week and a half ago. He denies any dysuria. He states that he is taking ibuprofen at home, but is experiencing stomach pain when he takes it.  He states that he was seen in ED on 12/4, but is already out of the prescription of pain medication that he was given. He reports that he had back surgery performed a year and a half ago by Dr. Newell Coral.   Past Medical History  Diagnosis Date  . Chronic back pain   . Sciatica     Past Surgical History  Procedure Date  . Back surgery   . Appendectomy   . Orchiopexy     History reviewed. No pertinent family history.  History  Substance Use Topics  . Smoking status: Current Some Day Smoker -- 1.0 packs/day  . Smokeless tobacco: Not on file  . Alcohol Use: Yes      Review of Systems A complete 10 system review of systems was obtained and all systems are negative except as noted in the HPI and PMH.  Allergies  Review of patient's allergies indicates no known allergies.  Home Medications   Current Outpatient Rx  Name  Route  Sig  Dispense  Refill  . IBUPROFEN 200 MG PO TABS   Oral   Take 600 mg by mouth every 6 (six) hours as needed. For pain           BP 156/97  Pulse 91  Temp 98.5 F (36.9 C) (Oral)  Resp 18  SpO2 100%  Physical Exam  Nursing note and vitals  reviewed. Constitutional: He is oriented to person, place, and time. He appears well-developed and well-nourished.  HENT:  Head: Normocephalic and atraumatic.  Eyes: EOM are normal. Pupils are equal, round, and reactive to light.  Neck: Normal range of motion. Neck supple.  Cardiovascular: Normal rate, normal heart sounds and intact distal pulses.   Pulmonary/Chest: Effort normal and breath sounds normal.  Abdominal: Bowel sounds are normal. He exhibits no distension. There is no tenderness.  Musculoskeletal: Normal range of motion. He exhibits tenderness (diffuse lower back tenderness, tender at the L sciatic notch). He exhibits no edema.       Neurovascularly intact  Neurological: He is alert and oriented to person, place, and time. He has normal strength. No cranial nerve deficit or sensory deficit.  Skin: Skin is warm and dry. No rash noted.  Psychiatric: He has a normal mood and affect.    ED Course  Procedures (including critical care time)  DIAGNOSTIC STUDIES: Oxygen Saturation is 100% on room air, normal by my interpretation.    COORDINATION OF CARE:  15:01- Discussed planned course of treatment with the patient, including pain medication, who is agreeable at this time.   Labs Reviewed - No data to display No results found.   No  diagnosis found.    MDM  Exacerbation of chronic back pain, no Red Flags.   I personally performed the services described in this documentation, which was scribed in my presence. The recorded information has been reviewed and is accurate.        Charles B. Bernette Mayers, MD 06/14/12 1534

## 2012-06-14 NOTE — ED Notes (Signed)
Pt c/o lower back pain with radiation down left leg x weeks worse over last couple of days; pt sts hx of chronic back pain that feels same

## 2012-08-10 ENCOUNTER — Emergency Department (HOSPITAL_COMMUNITY)
Admission: EM | Admit: 2012-08-10 | Discharge: 2012-08-10 | Disposition: A | Discharge status: Home / Self Care | Payer: Self-pay | Attending: Emergency Medicine | Admitting: Emergency Medicine

## 2012-08-10 ENCOUNTER — Encounter (HOSPITAL_COMMUNITY): Payer: Self-pay | Admitting: Emergency Medicine

## 2012-08-10 DIAGNOSIS — Z8739 Personal history of other diseases of the musculoskeletal system and connective tissue: Secondary | ICD-10-CM | POA: Insufficient documentation

## 2012-08-10 DIAGNOSIS — F17200 Nicotine dependence, unspecified, uncomplicated: Secondary | ICD-10-CM | POA: Insufficient documentation

## 2012-08-10 DIAGNOSIS — M549 Dorsalgia, unspecified: Secondary | ICD-10-CM

## 2012-08-10 DIAGNOSIS — W010XXA Fall on same level from slipping, tripping and stumbling without subsequent striking against object, initial encounter: Secondary | ICD-10-CM | POA: Insufficient documentation

## 2012-08-10 DIAGNOSIS — M79609 Pain in unspecified limb: Secondary | ICD-10-CM | POA: Insufficient documentation

## 2012-08-10 DIAGNOSIS — Y939 Activity, unspecified: Secondary | ICD-10-CM | POA: Insufficient documentation

## 2012-08-10 DIAGNOSIS — Z9889 Other specified postprocedural states: Secondary | ICD-10-CM | POA: Insufficient documentation

## 2012-08-10 DIAGNOSIS — IMO0002 Reserved for concepts with insufficient information to code with codable children: Secondary | ICD-10-CM | POA: Insufficient documentation

## 2012-08-10 DIAGNOSIS — Y929 Unspecified place or not applicable: Secondary | ICD-10-CM | POA: Insufficient documentation

## 2012-08-10 DIAGNOSIS — R209 Unspecified disturbances of skin sensation: Secondary | ICD-10-CM | POA: Insufficient documentation

## 2012-08-10 MED ORDER — IBUPROFEN 800 MG PO TABS: 800.0000 mg | ORAL_TABLET | Freq: Three times a day (TID) | ORAL | Status: DC

## 2012-08-10 MED ORDER — HYDROCODONE-ACETAMINOPHEN 5-325 MG PO TABS: 1.0000 | ORAL_TABLET | ORAL | Status: DC | PRN

## 2012-08-10 NOTE — ED Provider Notes (Signed)
Medical screening examination/treatment/procedure(s) were performed by non-physician practitioner and as supervising physician I was immediately available for consultation/collaboration.   Carleene Cooper III, MD 08/10/12 [phone]

## 2012-08-10 NOTE — ED Notes (Signed)
Pt c/o lower back pain with radiation down left leg x 2 days; pt sts chronic pain in back similar to this; pt denies obvious injury

## 2012-08-10 NOTE — ED Provider Notes (Signed)
History     CSN: [id number]  Arrival date & time 08/10/12  4098   First MD Initiated Contact with Patient 08/10/12 1057      Chief Complaint  Patient presents with  . Back Pain    (Consider location/radiation/quality/duration/timing/severity/associated sxs/prior treatment) HPI Comments: Pt presents to ED for low back pain after a fall 2 days ago on some ice.  States he slipped and fell backwards and landed on his left side.  Denies any head trauma or LOC.  Is also complaining of sharp, shooting pains radiating from his buttock into his left leg which are of new onset.  Has chronic back pain and residual numbness of his left foot from back surgery approximately 1 year ago.  Denies any numbness or tingling into left or right leg.  No loss of bowel or bladder function.  Patient is a 52 y.o. male presenting with back pain. The history is provided by the patient.  Back Pain   Past Medical History  Diagnosis Date  . Chronic back pain   . Sciatica     Past Surgical History  Procedure Laterality Date  . Back surgery    . Appendectomy    . Orchiopexy      History reviewed. No pertinent family history.  History  Substance Use Topics  . Smoking status: Current Some Day Smoker -- 1.00 packs/day  . Smokeless tobacco: Not on file  . Alcohol Use: Yes      Review of Systems  Musculoskeletal: Positive for back pain.  All other systems reviewed and are negative.    Allergies  Review of patient's allergies indicates no known allergies.  Home Medications  No current outpatient prescriptions on file.  BP 140/90  Pulse 87  Temp(Src) 98.7 F (37.1 C) (Oral)  SpO2 99%  Physical Exam  Nursing note and vitals reviewed. Constitutional: He is oriented to person, place, and time. He appears well-developed and well-nourished.  HENT:  Head: Normocephalic and atraumatic.  Mouth/Throat: Oropharynx is clear and moist.  Eyes: Conjunctivae and EOM are normal. Pupils are equal, round,  and reactive to light.  Neck: Normal range of motion.  Cardiovascular: Normal rate, regular rhythm and normal heart sounds.   Pulmonary/Chest: Effort normal and breath sounds normal.  Musculoskeletal:       Lumbar back: He exhibits tenderness (left flank). He exhibits normal range of motion, no bony tenderness, no swelling, no edema, no deformity and no spasm.  Pain localized to left flank; no LS bony tenderness, neurological deficits, normal sensation, normal ROM  Neurological: He is alert and oriented to person, place, and time.  Skin: Skin is warm and dry.  Psychiatric: He has a normal mood and affect.    ED Course  Procedures (including critical care time)  Labs Reviewed - No data to display No results found.   1. Back pain       MDM  11:31 AM Pt evaluated.  Pt complaining of fall approx. 2 days ago after slipping on ice.  Now has new onset shooting pains down left leg.  Has residual numbness in left foot after back surgery but no new neurological deficits noted.  Full ROM of back.  Most likely exacerbation of chronic pain.  Script given for pain meds and ibuprofen.  Follow up with PCP or back specialist for chronic pain concerns.  Return to ED for new or worsening symptoms.        Garlon Hatchet, PA-C 08/10/12 1435

## 2013-03-28 ENCOUNTER — Emergency Department (HOSPITAL_COMMUNITY): Payer: Self-pay

## 2013-03-28 ENCOUNTER — Inpatient Hospital Stay (HOSPITAL_COMMUNITY)
Admission: EM | Admit: 2013-03-28 | Discharge: 2013-04-01 | DRG: 442 | Disposition: A | Discharge status: Home / Self Care | Payer: Self-pay | Attending: Internal Medicine | Admitting: Internal Medicine

## 2013-03-28 ENCOUNTER — Encounter (HOSPITAL_COMMUNITY): Payer: Self-pay | Admitting: *Deleted

## 2013-03-28 DIAGNOSIS — R7401 Elevation of levels of liver transaminase levels: Secondary | ICD-10-CM | POA: Diagnosis present

## 2013-03-28 DIAGNOSIS — F17200 Nicotine dependence, unspecified, uncomplicated: Secondary | ICD-10-CM | POA: Diagnosis present

## 2013-03-28 DIAGNOSIS — M5430 Sciatica, unspecified side: Secondary | ICD-10-CM | POA: Diagnosis present

## 2013-03-28 DIAGNOSIS — N400 Enlarged prostate without lower urinary tract symptoms: Secondary | ICD-10-CM | POA: Diagnosis present

## 2013-03-28 DIAGNOSIS — K7689 Other specified diseases of liver: Secondary | ICD-10-CM | POA: Diagnosis present

## 2013-03-28 DIAGNOSIS — K810 Acute cholecystitis: Secondary | ICD-10-CM | POA: Diagnosis present

## 2013-03-28 DIAGNOSIS — B1710 Acute hepatitis C without hepatic coma: Principal | ICD-10-CM

## 2013-03-28 DIAGNOSIS — F1010 Alcohol abuse, uncomplicated: Secondary | ICD-10-CM | POA: Diagnosis present

## 2013-03-28 LAB — URINALYSIS, ROUTINE W REFLEX MICROSCOPIC
Hgb urine dipstick: NEGATIVE
Nitrite: NEGATIVE
Specific Gravity, Urine: 1.018 (ref 1.005–1.030)
pH: 6 (ref 5.0–8.0)

## 2013-03-28 LAB — CBC WITH DIFFERENTIAL/PLATELET
Basophils Absolute: 0.1 10*3/uL (ref 0.0–0.1)
HCT: 45 % (ref 39.0–52.0)
Hemoglobin: 16.1 g/dL (ref 13.0–17.0)
Lymphocytes Relative: 23 % (ref 12–46)
Monocytes Absolute: 1.3 10*3/uL — ABNORMAL HIGH (ref 0.1–1.0)
Monocytes Relative: 13 % — ABNORMAL HIGH (ref 3–12)
Neutro Abs: 6.2 10*3/uL (ref 1.7–7.7)
RBC: 4.8 MIL/uL (ref 4.22–5.81)
WBC: 10 10*3/uL (ref 4.0–10.5)

## 2013-03-28 LAB — COMPREHENSIVE METABOLIC PANEL
BUN: 9 mg/dL (ref 6–23)
Calcium: 8.8 mg/dL (ref 8.4–10.5)
GFR calc Af Amer: >90 mL/min (ref >90)
Glucose, Bld: 98 mg/dL (ref 70–99)
Total Protein: 7.1 g/dL (ref 6.0–8.3)

## 2013-03-28 MED ORDER — HEPARIN SODIUM (PORCINE) 5000 UNIT/ML IJ SOLN
5000.0000 [IU] | Freq: Three times a day (TID) | INTRAMUSCULAR | Status: DC
  Administered 2013-03-29 – 2013-04-01 (×9): 5000 [IU] via SUBCUTANEOUS
  Filled 2013-03-28 (×13): qty 1

## 2013-03-28 MED ORDER — IBUPROFEN 600 MG PO TABS
600.0000 mg | ORAL_TABLET | ORAL | Status: DC | PRN
  Filled 2013-03-28: qty 1

## 2013-03-28 MED ORDER — SODIUM CHLORIDE 0.9 % IV SOLN
Freq: Once | INTRAVENOUS | Status: AC
  Administered 2013-03-28: 22:00:00 via INTRAVENOUS

## 2013-03-28 MED ORDER — IOHEXOL 300 MG/ML  SOLN
100.0000 mL | Freq: Once | INTRAMUSCULAR | Status: AC | PRN
  Administered 2013-03-28: 100 mL via INTRAVENOUS

## 2013-03-28 MED ORDER — MORPHINE SULFATE 2 MG/ML IJ SOLN
2.0000 mg | INTRAMUSCULAR | Status: DC | PRN
  Administered 2013-03-29: 2 mg via INTRAVENOUS
  Filled 2013-03-28: qty 1

## 2013-03-28 MED ORDER — IOHEXOL 300 MG/ML  SOLN
50.0000 mL | Freq: Once | INTRAMUSCULAR | Status: AC | PRN
  Administered 2013-03-28: 50 mL via ORAL

## 2013-03-28 MED ORDER — MORPHINE SULFATE 4 MG/ML IJ SOLN
4.0000 mg | Freq: Once | INTRAMUSCULAR | Status: AC
  Administered 2013-03-28: 4 mg via INTRAVENOUS
  Filled 2013-03-28: qty 1

## 2013-03-28 NOTE — ED Notes (Signed)
Pt c/o headache; back pain; abd pain; sick x 2 days; jaundiced; no nausea or vomiting; states urine is dark; denies pain with urination

## 2013-03-28 NOTE — Progress Notes (Signed)
Patient confirms he does not have insurance or a pcp.  [facility] provided patient with a list of pcps who accept self pay patients, list of discounted pharmacies and website needymeds.org for medication assistance, information regarding Medicaid and affordable Care act for insurance, list of financial assistance in the community and list of dental practices who accept self pay patients.  Patient very thankful for resources.  Patient also agreeable to receive information regarding the orange card.  EDCM will speak to [facility] representative to send patient information in the mail to address [address] [place].  Patie is currently staying with his son.  No further needs at this time.

## 2013-03-28 NOTE — H&P (Addendum)
[facility] History and Physical  ANMOL FLECK JWJ:[id number] DOB: [birth year] DOA: 03/28/2013  Referring physician: Emergency Department PCP: NONE Specialists:   Chief Complaint: Abdominal Pain  HPI: Brian Huynh is a 52 y.o. male  With a pmh of etoh abuse and has multiple tattoos who presents to the ED with complaints of generalized abd pain. In the ED, pt was found to have elevated liver enzymes (see labs). A ct abd was done with findings suggestive of questionable acute cholecystitis. The Hospitalist was consulted for admission.  Review of Systems:  Per above, remainder of 10pt ros reviewed and are neg  Past Medical History  Diagnosis Date  . Chronic back pain   . Sciatica    Past Surgical History  Procedure Laterality Date  . Back surgery    . Appendectomy    . Orchiopexy     Social History:  reports that he has been smoking.  He does not have any smokeless tobacco history on file. He reports that  drinks alcohol. He reports that he does not use illicit drugs.  where does patient live--home, ALF, SNF? and with whom if at home?  Can patient participate in ADLs?  No Known Allergies  No family history on file.  (be sure to complete)  Prior to Admission medications   Not on File   Physical Exam: Filed Vitals:   03/28/13 1943  BP: 144/87  Pulse: 72  Temp: 98.5 F (36.9 C)  TempSrc: Oral  Resp: 18  SpO2: 100%     General:  Awake, in nad  Eyes: PERRL B  ENT: membranes moist, dentition fair  Neck: trachea midline, neck supple  Cardiovascular: regular, s1, s2  Respiratory: normal resp effort, no wheezing  Abdomen: soft, pos bs  Skin: jaundice, multiple tattoos  Musculoskeletal: perfused, no clubbing or cyanosis  Psychiatric: mood/affect normal // no auditory/visual hallucinations  Neurologic: cn2-12 grossly intact, strength/sensation intact  Labs on Admission:  Basic Metabolic Panel:  Recent Labs Lab 03/28/13 2010  NA 136  K 4.2   CL 98  CO2 29  GLUCOSE 98  BUN 9  CREATININE 0.94  CALCIUM 8.8   Liver Function Tests:  Recent Labs Lab 03/28/13 2010  AST 903*  ALT 1590*  ALKPHOS 318*  BILITOT 10.1*  PROT 7.1  ALBUMIN 2.9*    Recent Labs Lab 03/28/13 2048  LIPASE 57   No results found for this basename: AMMONIA,  in the last 168 hours CBC:  Recent Labs Lab 03/28/13 2010  WBC 10.0  NEUTROABS 6.2  HGB 16.1  HCT 45.0  MCV 93.8  PLT 276   Cardiac Enzymes: No results found for this basename: CKTOTAL, CKMB, CKMBINDEX, TROPONINI,  in the last 168 hours  BNP (last 3 results) No results found for this basename: PROBNP,  in the last 8760 hours CBG: No results found for this basename: GLUCAP,  in the last 168 hours  Radiological Exams on Admission: Ct Abdomen Pelvis W Contrast  03/28/2013   CLINICAL DATA:  Abdominal pain, back pain and headache. Jaundice.  EXAM: CT ABDOMEN AND PELVIS WITH CONTRAST  TECHNIQUE: Multidetector CT imaging of the abdomen and pelvis was performed using the standard protocol following bolus administration of intravenous contrast.  CONTRAST:  OMNIPAQUE IOHEXOL 300 MG/ML  SOLN  COMPARISON:  MRI of the lumbar spine performed 12/01/2010  FINDINGS: Minimal bibasilar atelectasis is noted.  A 1.1 cm hypodensity within the right hepatic lobe is nonspecific but may reflect a small cyst. The  liver is otherwise unremarkable in appearance. The spleen is mildly enlarged, measuring 13.2 cm in length.  There is mild gallbladder wall thickening, with trace pericholecystic fluid, raising concern for mild cholecystitis. Trace fluid is noted tracking along the porta hepatis. No definite stones are characterized on CT, though evaluation for stones is limited on CT.  The pancreas and adrenal glands are unremarkable in appearance with  The kidneys are unremarkable in appearance. There is no evidence of hydronephrosis. No renal or ureteral stones are seen. No perinephric stranding is appreciated.   No free fluid is identified. The small bowel is unremarkable in appearance. The stomach is within normal limits. No acute vascular abnormalities are seen. Mild calcification is noted along the common iliac arteries and their branches.  The patient is status post appendectomy. The colon is unremarkable in appearance.  The bladder is moderately distended and grossly unremarkable in appearance. The prostate is mildly enlarged, measuring 5.3 cm in transverse dimension. No inguinal lymphadenopathy is seen.  No acute osseous abnormalities are identified.  IMPRESSION: 1. Suspect mild acute cholecystitis, given mild gallbladder wall thickening and trace pericholecystic fluid. Fluid tracks along the porta hepatis. 2. Mild splenomegaly. 3. Likely small hepatic cyst. 4. Mildly enlarged prostate.   Electronically Signed   By: Roanna Raider M.D.   On: 03/28/2013 22:44    Assessment/Plan Principal Problem:   Transaminitis Active Problems:   ETOH abuse   1. Transaminitis 1. Normal appearing liver on abd CT w/ no stones seen and no signs of CBD diliation 2. Elevated AST/ALT and alk phos with questionable acute cholecystitis on CT 3. Surgery was initially called through the ED with recs for med mgt 4. Will avoid hepatotoxic meds 5. Admit to med, inpt 6. Will check acute hepatits panel 7. Will check urine tox, ANA 8. Pending results of tests, may consider GI consult 2. Hx of ETOH abuse 1. Claims he only drinks "a couple times a week," however after a detailed hx, pt appears to have been drinking etoh at least daily for the past several days. 2. Will initiate CIWA protocol 3. DVT prophylaxis 1. Heparin subQ  Code Status: Full (must indicate code status--if unknown or must be presumed, indicate so) Family Communication: Pt in room (indicate person spoken with, if applicable, with phone number if by telephone) Disposition Plan: Pending (indicate anticipated LOS)  Time spent:  Keitra Carusone K [facility] Pager [phone]  If 7PM-7AM, please contact night-coverage [email or web address] Password [email or web address] 03/29/2013, 12:01 AM

## 2013-03-28 NOTE — ED Provider Notes (Signed)
CSN: [id number]     Arrival date & time 03/28/13  1820 History   First MD Initiated Contact with Patient 03/28/13 2020     Chief Complaint  Patient presents with  . Abdominal Pain   (Consider location/radiation/quality/duration/timing/severity/associated sxs/prior Treatment) Patient is a 52 y.o. male presenting with abdominal pain. The history is provided by the patient.  Abdominal Pain Associated symptoms: no chest pain, no diarrhea, no nausea, no shortness of breath and no vomiting    patient presents with upper abdominal pain for last 2 days. It is dull. No nausea vomiting. He also has worsening pain in his lower back. His chronic back pain. He states is worse in the right side. He also states she's had a throbbing headache. It is over his whole head. No weight loss. No cough. He states that he was told by a family member that his eyes looked yellow today. No known history of hepatitis. He states he was a former heavy drinker but does not drink heavily now. He states his urine has been dark.  Past Medical History  Diagnosis Date  . Chronic back pain   . Sciatica    Past Surgical History  Procedure Laterality Date  . Back surgery    . Appendectomy    . Orchiopexy     No family history on file. History  Substance Use Topics  . Smoking status: Current Some Day Smoker -- 1.00 packs/day  . Smokeless tobacco: Not on file  . Alcohol Use: Yes    Review of Systems  Constitutional: Negative for activity change, appetite change and unexpected weight change.  HENT: Negative for neck stiffness.   Eyes: Negative for pain.  Respiratory: Negative for chest tightness and shortness of breath.   Cardiovascular: Negative for chest pain and leg swelling.  Gastrointestinal: Positive for abdominal pain. Negative for nausea, vomiting and diarrhea.  Genitourinary: Negative for flank pain.  Musculoskeletal: Positive for back pain.  Skin: Positive for color change. Negative for rash.  Neurological:  Positive for headaches. Negative for weakness and numbness.  Psychiatric/Behavioral: Negative for behavioral problems.    Allergies  Review of patient's allergies indicates no known allergies.  Home Medications  No current outpatient prescriptions on file. BP 144/87  Pulse 72  Temp(Src) 98.5 F (36.9 C) (Oral)  Resp 18  SpO2 100% Physical Exam  Nursing note and vitals reviewed. Constitutional: He is oriented to person, place, and time. He appears well-developed and well-nourished.  HENT:  Head: Normocephalic and atraumatic.  Eyes: EOM are normal. Pupils are equal, round, and reactive to light. Scleral icterus is present.  Neck: Normal range of motion. Neck supple.  Cardiovascular: Normal rate, regular rhythm and normal heart sounds.   No murmur heard. Pulmonary/Chest: Effort normal and breath sounds normal.  Abdominal: Soft. Bowel sounds are normal. He exhibits no distension and no mass. There is tenderness. There is no rebound and no guarding.  Bilateral upper abdominal tenderness without rebound or guarding. No ecchymosis.  Musculoskeletal: Normal range of motion. He exhibits no edema.  Neurological: He is alert and oriented to person, place, and time. No cranial nerve deficit.  Skin: Skin is warm and dry.  Psychiatric: He has a normal mood and affect.    ED Course  Procedures (including critical care time) Labs Review Labs Reviewed  COMPREHENSIVE METABOLIC PANEL - Abnormal; Notable for the following:    Albumin 2.9 (*)    AST 903 (*)    ALT 1590 (*)    Alkaline Phosphatase  318 (*)    Total Bilirubin 10.1 (*)    All other components within normal limits  CBC WITH DIFFERENTIAL - Abnormal; Notable for the following:    Monocytes Relative 13 (*)    Monocytes Absolute 1.3 (*)    All other components within normal limits  URINALYSIS, ROUTINE W REFLEX MICROSCOPIC - Abnormal; Notable for the following:    Color, Urine AMBER (*)    Bilirubin Urine MODERATE (*)    All  other components within normal limits  LIPASE, BLOOD   Imaging Review Ct Abdomen Pelvis W Contrast  03/28/2013   CLINICAL DATA:  Abdominal pain, back pain and headache. Jaundice.  EXAM: CT ABDOMEN AND PELVIS WITH CONTRAST  TECHNIQUE: Multidetector CT imaging of the abdomen and pelvis was performed using the standard protocol following bolus administration of intravenous contrast.  CONTRAST:  OMNIPAQUE IOHEXOL 300 MG/ML  SOLN  COMPARISON:  MRI of the lumbar spine performed 12/01/2010  FINDINGS: Minimal bibasilar atelectasis is noted.  A 1.1 cm hypodensity within the right hepatic lobe is nonspecific but may reflect a small cyst. The liver is otherwise unremarkable in appearance. The spleen is mildly enlarged, measuring 13.2 cm in length.  There is mild gallbladder wall thickening, with trace pericholecystic fluid, raising concern for mild cholecystitis. Trace fluid is noted tracking along the porta hepatis. No definite stones are characterized on CT, though evaluation for stones is limited on CT.  The pancreas and adrenal glands are unremarkable in appearance with  The kidneys are unremarkable in appearance. There is no evidence of hydronephrosis. No renal or ureteral stones are seen. No perinephric stranding is appreciated.  No free fluid is identified. The small bowel is unremarkable in appearance. The stomach is within normal limits. No acute vascular abnormalities are seen. Mild calcification is noted along the common iliac arteries and their branches.  The patient is status post appendectomy. The colon is unremarkable in appearance.  The bladder is moderately distended and grossly unremarkable in appearance. The prostate is mildly enlarged, measuring 5.3 cm in transverse dimension. No inguinal lymphadenopathy is seen.  No acute osseous abnormalities are identified.  IMPRESSION: 1. Suspect mild acute cholecystitis, given mild gallbladder wall thickening and trace pericholecystic fluid. Fluid tracks  along the porta hepatis. 2. Mild splenomegaly. 3. Likely small hepatic cyst. 4. Mildly enlarged prostate.   Electronically Signed   By: Roanna Raider M.D.   On: 03/28/2013 22:44    MDM   1. Transaminitis    patient with upper abdominal pain and elevated LFTs. CT scan shows possible cholecystitis, however patient has minimal tenderness over the gallbladder. Common bile duct is not dilated. Will be admitted to internal medicine after discussion with general surgery.    Juliet Rude. Rubin Payor, MD 03/29/13 0002

## 2013-03-29 DIAGNOSIS — K810 Acute cholecystitis: Secondary | ICD-10-CM

## 2013-03-29 DIAGNOSIS — F1010 Alcohol abuse, uncomplicated: Secondary | ICD-10-CM | POA: Diagnosis present

## 2013-03-29 LAB — COMPREHENSIVE METABOLIC PANEL
ALT: 1251 U/L — ABNORMAL HIGH (ref 0–53)
Albumin: 2.4 g/dL — ABNORMAL LOW (ref 3.5–5.2)
Albumin: 2.7 g/dL — ABNORMAL LOW (ref 3.5–5.2)
Alkaline Phosphatase: 267 U/L — ABNORMAL HIGH (ref 39–117)
Alkaline Phosphatase: 288 U/L — ABNORMAL HIGH (ref 39–117)
BUN: 10 mg/dL (ref 6–23)
BUN: 12 mg/dL (ref 6–23)
CO2: 27 mEq/L (ref 19–32)
Calcium: 8.3 mg/dL — ABNORMAL LOW (ref 8.4–10.5)
Chloride: 103 mEq/L (ref 96–112)
Chloride: 101 mEq/L (ref 96–112)
Creatinine, Ser: 1.07 mg/dL (ref 0.50–1.35)
GFR calc Af Amer: 90 mL/min — ABNORMAL LOW (ref >90)
GFR calc Af Amer: >90 mL/min (ref >90)
GFR calc non Af Amer: 77 mL/min — ABNORMAL LOW (ref >90)
Glucose, Bld: 99 mg/dL (ref 70–99)
Potassium: 3.7 mEq/L (ref 3.5–5.1)
Potassium: 4.2 mEq/L (ref 3.5–5.1)
Sodium: 138 mEq/L (ref 135–145)
Sodium: 136 mEq/L (ref 135–145)
Total Bilirubin: 10.3 mg/dL — ABNORMAL HIGH (ref 0.3–1.2)
Total Protein: 6 g/dL (ref 6.0–8.3)
Total Protein: 6.4 g/dL (ref 6.0–8.3)

## 2013-03-29 LAB — URINE DRUGS OF ABUSE SCREEN W ALC, ROUTINE (REF LAB)
Amphetamine Screen, Ur: NEGATIVE
Barbiturate Quant, Ur: NEGATIVE
Cocaine Metabolites: NEGATIVE
Creatinine,U: 62.6 mg/dL
Ethyl Alcohol: <10 mg/dL (ref <10)
Marijuana Metabolite: NEGATIVE
Opiate Screen, Urine: NEGATIVE
Phencyclidine (PCP): NEGATIVE

## 2013-03-29 LAB — CBC
HCT: 41.3 % (ref 39.0–52.0)
Hemoglobin: 14.6 g/dL (ref 13.0–17.0)
MCV: 94.1 fL (ref 78.0–100.0)
RDW: 14.7 % (ref 11.5–15.5)
WBC: 7.8 10*3/uL (ref 4.0–10.5)

## 2013-03-29 LAB — HEPATITIS PANEL, ACUTE
Hep A IgM: NEGATIVE
Hep B C IgM: NEGATIVE
Hepatitis B Surface Ag: NEGATIVE

## 2013-03-29 LAB — ETHANOL: Alcohol, Ethyl (B): <11 mg/dL (ref 0–11)

## 2013-03-29 LAB — ANA: Anti Nuclear Antibody(ANA): NEGATIVE

## 2013-03-29 LAB — PROTIME-INR: INR: 1.16 (ref 0.00–1.49)

## 2013-03-29 MED ORDER — ADULT MULTIVITAMIN W/MINERALS CH
1.0000 | ORAL_TABLET | Freq: Every day | ORAL | Status: DC
  Administered 2013-03-29 – 2013-04-01 (×4): 1 via ORAL
  Filled 2013-03-29 (×4): qty 1

## 2013-03-29 MED ORDER — LORAZEPAM 2 MG/ML IJ SOLN: 1.0000 mg | Freq: Four times a day (QID) | INTRAMUSCULAR | Status: AC | PRN

## 2013-03-29 MED ORDER — VITAMIN B-1 100 MG PO TABS
100.0000 mg | ORAL_TABLET | Freq: Every day | ORAL | Status: DC
  Administered 2013-03-29 – 2013-04-01 (×4): 100 mg via ORAL
  Filled 2013-03-29 (×4): qty 1

## 2013-03-29 MED ORDER — HYDROMORPHONE HCL PF 1 MG/ML IJ SOLN
1.0000 mg | INTRAMUSCULAR | Status: DC | PRN
  Administered 2013-03-29 – 2013-03-31 (×17): 2 mg via INTRAVENOUS
  Administered 2013-04-01 (×2): 1 mg via INTRAVENOUS
  Administered 2013-04-01: 2 mg via INTRAVENOUS
  Filled 2013-03-29 (×3): qty 2
  Filled 2013-03-29: qty 1
  Filled 2013-03-29 (×16): qty 2

## 2013-03-29 MED ORDER — FOLIC ACID 1 MG PO TABS
1.0000 mg | ORAL_TABLET | Freq: Every day | ORAL | Status: DC
  Administered 2013-03-29 – 2013-04-01 (×4): 1 mg via ORAL
  Filled 2013-03-29 (×4): qty 1

## 2013-03-29 MED ORDER — THIAMINE HCL 100 MG/ML IJ SOLN
100.0000 mg | Freq: Every day | INTRAMUSCULAR | Status: DC
  Filled 2013-03-29 (×4): qty 1

## 2013-03-29 MED ORDER — LORAZEPAM 1 MG PO TABS: 1.0000 mg | ORAL_TABLET | Freq: Four times a day (QID) | ORAL | Status: AC | PRN

## 2013-03-29 MED ORDER — NICOTINE 14 MG/24HR TD PT24
14.0000 mg | MEDICATED_PATCH | Freq: Every day | TRANSDERMAL | Status: DC
  Administered 2013-03-29 – 2013-04-01 (×5): 14 mg via TRANSDERMAL
  Filled 2013-03-29 (×5): qty 1

## 2013-03-29 MED ORDER — PROMETHAZINE HCL 25 MG/ML IJ SOLN: 25.0000 mg | INTRAMUSCULAR | Status: DC | PRN

## 2013-03-29 NOTE — Progress Notes (Signed)
[facility] PROGRESS NOTE  Brian Huynh WUJ:[id number] DOB: 1961/01/28 DOA: 03/28/2013 PCP: Default, Provider, MD  Brief narrative: 52 y.o. male with past medical history of alocohol abuse who presented to [facility] ED 03/28/2013 with complaints of generalized abdominal pain for past few days prior to this admission without associated nausea or vomiting. No reports of fever or chills. No diarrhea or constipation. No blood in stool. In ED, vitals were stable with BP 123/80, HR 56 -72 and T 98.7 F. Oxygen saturation was 97% on room air. CMP revealed AST/ALT and ALP to be 903/1590 and 318. Total bilirubin was 10.1. UDS was negative and urinalysis was negative. CT abdomen was significant for acute cholecystitis. Surgery was consulted by ED physician and recommended medical management.  Assessment and Plan:  Principal Problem:   Transaminitis in the setting of acute cholecystitis - LFT's trending down from AST/ALT/ALP 903/1590/ 318 to 689/1251/267 and bilirubin is 10.3. - appreciate GI consult and recommendations - keep NPO for now - pain management with dilaudid 1 mg every 2 hours PRN IV severe pain Active Problems:   Cholecystitis, acute   ETOH abuse - CIWA ordered - check ethanol level - continue folic acid, thiamine and multivitamin    Consultants:  Gastroenterology  Procedures/Studies: Ct Abdomen Pelvis W Contrast 03/28/2013    1. Suspect mild acute cholecystitis, given mild gallbladder wall thickening and trace pericholecystic fluid. Fluid tracks along the porta hepatis. 2. Mild splenomegaly. 3. Likely small hepatic cyst. 4. Mildly enlarged prostate.  Antibiotics:  None   Code Status: Full Family Communication: Pt at bedside Disposition Plan: Home when medically stable  HPI/Subjective: No events overnight.   Objective: Filed Vitals:   03/29/13 0029 03/29/13 0101 03/29/13 0513 03/29/13 0600  BP: 127/72 124/96 130/77 123/80  Pulse: 63 63 56 72  Temp: 98.6 F (37 C)  98.5 F (36.9 C) 98.7 F (37.1 C) 98.4 F (36.9 C)  TempSrc: Oral Oral Oral Oral  Resp: 18 20 18 16   Height:  6' (1.829 m)    Weight:  77.111 kg (170 lb)    SpO2: 97% 100% 97% 98%    Intake/Output Summary (Last 24 hours) at 03/29/13 0959 Last data filed at 03/29/13 0100  Gross per 24 hour  Intake    240 ml  Output      0 ml  Net    240 ml    Exam:   General:  Pt is alert, follows commands appropriately, not in acute distress  Cardiovascular: Regular rate and rhythm, S1/S2 appreciated  Respiratory: Clear to auscultation bilaterally, no wheezing, no crackles, no rhonchi  Abdomen: Soft, tender in upper abdomen, non distended, bowel sounds present, no guarding  Extremities: Pulses DP and PT palpable bilaterally  Neuro: Grossly nonfocal  Data Reviewed: Basic Metabolic Panel:  Recent Labs Lab 03/28/13 2010 03/29/13 0535  NA 136 138  K 4.2 3.7  CL 98 103  CO2 29 27  GLUCOSE 98 99  BUN 9 10  CREATININE 0.94 1.07  CALCIUM 8.8 8.3*   Liver Function Tests:  Recent Labs Lab 03/28/13 2010 03/29/13 0535  AST 903* 689*  ALT 1590* 1251*  ALKPHOS 318* 267*  BILITOT 10.1* 10.3*  PROT 7.1 6.0  ALBUMIN 2.9* 2.4*    Recent Labs Lab 03/28/13 2048  LIPASE 57   CBC:  Recent Labs Lab 03/28/13 2010 03/29/13 0535  WBC 10.0 7.8  NEUTROABS 6.2  --   HGB 16.1 14.6  HCT 45.0 41.3  MCV 93.8 94.1  PLT 276 233    Scheduled Meds: . folic acid  1 mg Oral Daily  . heparin  5,000 Units Subcutaneous Q8H  . multivitamin with minerals  1 tablet Oral Daily  . nicotine  14 mg Transdermal Daily  . thiamine  100 mg Oral Daily   Or  . thiamine  100 mg Intravenous Daily    Debbora Presto, MD  [facility] Pager [phone]  If 7PM-7AM, please contact night-coverage [email or web address] Password [email or web address] 03/29/2013, 9:59 AM   LOS: 1 day

## 2013-03-29 NOTE — Consult Note (Signed)
Referring Provider: No ref. provider found Primary Care Physician:  Default, Provider, MD Primary Gastroenterologist:  None, unassigned.  Reason for Consultation:  Transaminitis  HPI: Brian Huynh is a 52 y.o. male with past medical history of alcohol abuse who presented to [facility] ED on 03/28/2013 with complaints of generalized abdominal pain for past few days prior to this admission without associated nausea or vomiting. No reports of fever or chills. No diarrhea or constipation. No blood in stool.  Says that a couple of days ago people starting telling him that he looked yellow.  In ED, vitals were stable with BP 123/80, HR 56 -72 and T 98.7 F. Oxygen saturation was 97% on room air. CMP revealed AST/ALT and ALP to be 903/1590 and 318. Total bilirubin was 10.1. UDS was negative and urinalysis was negative. CT abdomen showed the following:     "IMPRESSION:  1. Suspect mild acute cholecystitis, given mild gallbladder wall  thickening and trace pericholecystic fluid. Fluid tracks along the  porta hepatis.  2. Mild splenomegaly.  3. Likely small hepatic cyst.  4. Mildly enlarged prostate."  GI was consulted for further input.  Today LFT's have trended down slightly except for total bili, which remains around 10.  Patient denies any previous episodes of this in the past and has never been know to have elevated transaminases.  Denies use of excessive tylenol or tylenol containing products.  No use of herbs or supplements.  Has several tattoos with a new one just about 1.5 weeks ago.  No drug use.  Drinks ETOH.  INR is normal.   Past Medical History  Diagnosis Date  . Chronic back pain   . Sciatica     Past Surgical History  Procedure Laterality Date  . Back surgery    . Appendectomy    . Orchiopexy      Prior to Admission medications   Not on File    Current Facility-Administered Medications  Medication Dose Route Frequency Provider Last Rate Last Dose  . folic acid (FOLVITE) tablet 1  mg  1 mg Oral Daily Jerald Kief, MD   1 mg at 03/29/13 1028  . heparin injection 5,000 Units  5,000 Units Subcutaneous Q8H Jerald Kief, MD   5,000 Units at 03/29/13 0544  . HYDROmorphone (DILAUDID) injection 1-2 mg  1-2 mg Intravenous Q2H PRN Dorothea Ogle, MD      . LORazepam (ATIVAN) tablet 1 mg  1 mg Oral Q6H PRN Jerald Kief, MD       Or  . LORazepam (ATIVAN) injection 1 mg  1 mg Intravenous Q6H PRN Jerald Kief, MD      . multivitamin with minerals tablet 1 tablet  1 tablet Oral Daily Jerald Kief, MD   1 tablet at 03/29/13 1028  . nicotine (NICODERM CQ - dosed in mg/24 hours) patch 14 mg  14 mg Transdermal Daily Leda Gauze, NP   14 mg at 03/29/13 0137  . promethazine (PHENERGAN) injection 25 mg  25 mg Intravenous Q4H PRN Dorothea Ogle, MD      . thiamine (VITAMIN B-1) tablet 100 mg  100 mg Oral Daily Jerald Kief, MD   100 mg at 03/29/13 1028   Or  . thiamine (B-1) injection 100 mg  100 mg Intravenous Daily Jerald Kief, MD        Allergies as of 03/28/2013  . (No Known Allergies)    No family history on file.  History  Social History  . Marital Status: Married    Spouse Name: N/A    Number of Children: N/A  . Years of Education: N/A   Occupational History  . Not on file.   Social History Main Topics  . Smoking status: Current Some Day Smoker -- 1.00 packs/day  . Smokeless tobacco: Not on file  . Alcohol Use: Yes  . Drug Use: No  . Sexual Activity:    Other Topics Concern  . Not on file   Social History Narrative  . No narrative on file    Review of Systems: Ten point ROS is O/W negative except as mentioned in HPI.  Physical Exam: Vital signs in last 24 hours: Temp:  [98.4 F (36.9 C)-98.7 F (37.1 C)] 98.4 F (36.9 C) (10/07 0600) Pulse Rate:  [56-72] 72 (10/07 0600) Resp:  [16-20] 16 (10/07 0600) BP: (123-144)/(72-96) 123/80 mmHg (10/07 0600) SpO2:  [97 %-100 %] 98 % (10/07 0600) Weight:  [170 lb (77.111 kg)] 170 lb (77.111  kg) (10/07 0101)   General:   Alert, Well-developed, well-nourished, pleasant and cooperative in NAD Head:  Normocephalic and atraumatic. Eyes:  Scleral icterus is present. Ears:  Normal auditory acuity. Mouth:  No deformity or lesions.  Lungs:  Clear throughout to auscultation.  No wheezes, crackles, or rhonchi.  Heart:  Regular rate and rhythm; no murmurs, clicks, rubs, or gallops. Abdomen:  Soft, non-distended.  BS present.  Mild diffuse TTP, slightly more on the right side without R/R/G.  Rectal:  Deferred  Msk:  Symmetrical without gross deformities. Pulses:  Normal pulses noted. Extremities:  Without clubbing or edema. Neurologic:  Alert and  oriented x4;  grossly normal neurologically. Skin:  Intact without significant lesions or rashes.  Jaundiced.  Several tattoos noted with a large new tattoo with scabbing present on right arm. Psych:  Alert and cooperative. Normal mood and affect.  Intake/Output from previous day: 10/06 0701 - 10/07 0700 In: 240 [P.O.:240] Out: -   Lab Results:  Recent Labs  03/28/13 2010 03/29/13 0535  WBC 10.0 7.8  HGB 16.1 14.6  HCT 45.0 41.3  PLT 276 233   BMET  Recent Labs  03/28/13 2010 03/29/13 0535  NA 136 138  K 4.2 3.7  CL 98 103  CO2 29 27  GLUCOSE 98 99  BUN 9 10  CREATININE 0.94 1.07  CALCIUM 8.8 8.3*   LFT  Recent Labs  03/29/13 0535  PROT 6.0  ALBUMIN 2.4*  AST 689*  ALT 1251*  ALKPHOS 267*  BILITOT 10.3*   PT/INR  Recent Labs  03/29/13 0015  LABPROT 14.6  INR 1.16   Studies/Results: Ct Abdomen Pelvis W Contrast  03/28/2013   CLINICAL DATA:  Abdominal pain, back pain and headache. Jaundice.  EXAM: CT ABDOMEN AND PELVIS WITH CONTRAST  TECHNIQUE: Multidetector CT imaging of the abdomen and pelvis was performed using the standard protocol following bolus administration of intravenous contrast.  CONTRAST:  OMNIPAQUE IOHEXOL 300 MG/ML  SOLN  COMPARISON:  MRI of the lumbar spine performed 12/01/2010   FINDINGS: Minimal bibasilar atelectasis is noted.  A 1.1 cm hypodensity within the right hepatic lobe is nonspecific but may reflect a small cyst. The liver is otherwise unremarkable in appearance. The spleen is mildly enlarged, measuring 13.2 cm in length.  There is mild gallbladder wall thickening, with trace pericholecystic fluid, raising concern for mild cholecystitis. Trace fluid is noted tracking along the porta hepatis. No definite stones are characterized on CT, though  evaluation for stones is limited on CT.  The pancreas and adrenal glands are unremarkable in appearance with  The kidneys are unremarkable in appearance. There is no evidence of hydronephrosis. No renal or ureteral stones are seen. No perinephric stranding is appreciated.  No free fluid is identified. The small bowel is unremarkable in appearance. The stomach is within normal limits. No acute vascular abnormalities are seen. Mild calcification is noted along the common iliac arteries and their branches.  The patient is status post appendectomy. The colon is unremarkable in appearance.  The bladder is moderately distended and grossly unremarkable in appearance. The prostate is mildly enlarged, measuring 5.3 cm in transverse dimension. No inguinal lymphadenopathy is seen.  No acute osseous abnormalities are identified.  IMPRESSION: 1. Suspect mild acute cholecystitis, given mild gallbladder wall thickening and trace pericholecystic fluid. Fluid tracks along the porta hepatis. 2. Mild splenomegaly. 3. Likely small hepatic cyst. 4. Mildly enlarged prostate.   Electronically Signed   By: Roanna Raider M.D.   On: 03/28/2013 22:44    IMPRESSION:  -Transaminitis:  Suspect acute hepatitis rather than gallbladder issue/cholecystitis.  PLAN: -Follow-up results of acute hepatitis panel. -Continue to trend and monitor LFT's. -If patient were to develop more localized RUQ abdominal pain then would order ultrasound.   Jeilani Grupe D.   03/29/2013, 10:54 AM  Pager number [phone]  Chart was reviewed and patient was examined. X-rays and lab were reviewed.    I agree with management and plans.  The patient's presentation including abdominal discomfort and jaundice is more suggestive of an acute hepatitis.  Simple, acute cholecystitis would not explain LFT abnormalities.  There is no ductal dilatation that one would expect with biliary tract obstruction.  For now would obtain serial LFTs and check serologies for hepatitis A., B. and C.  With elevated bilirubin hepatobiliary scan may not successfully secrete the radio marker.  Should right-sided abdominal pain increase would obtain an abdominal ultrasound.  Barbette Hair. Arlyce Dice, M.D., [facility] Cell [phone]

## 2013-03-30 LAB — COMPREHENSIVE METABOLIC PANEL
AST: 945 U/L — ABNORMAL HIGH (ref 0–37)
BUN: 13 mg/dL (ref 6–23)
CO2: 28 mEq/L (ref 19–32)
Calcium: 8.6 mg/dL (ref 8.4–10.5)
Chloride: 102 mEq/L (ref 96–112)
Creatinine, Ser: 0.9 mg/dL (ref 0.50–1.35)
GFR calc Af Amer: >90 mL/min (ref >90)
GFR calc non Af Amer: >90 mL/min (ref >90)
Glucose, Bld: 76 mg/dL (ref 70–99)
Sodium: 137 mEq/L (ref 135–145)
Total Bilirubin: 14.6 mg/dL — ABNORMAL HIGH (ref 0.3–1.2)

## 2013-03-30 LAB — CBC
HCT: 41.3 % (ref 39.0–52.0)
Hemoglobin: 14.3 g/dL (ref 13.0–17.0)
MCV: 94.7 fL (ref 78.0–100.0)
RDW: 14.9 % (ref 11.5–15.5)
WBC: 6.2 10*3/uL (ref 4.0–10.5)

## 2013-03-30 MED ORDER — HYDROXYZINE HCL 25 MG PO TABS
25.0000 mg | ORAL_TABLET | Freq: Three times a day (TID) | ORAL | Status: DC | PRN
  Administered 2013-03-30: 25 mg via ORAL
  Filled 2013-03-30: qty 1

## 2013-03-30 MED ORDER — DOUBLE ANTIBIOTIC 500-10000 UNIT/GM EX OINT
TOPICAL_OINTMENT | Freq: Two times a day (BID) | CUTANEOUS | Status: DC
  Administered 2013-03-30 – 2013-03-31 (×4): via TOPICAL
  Filled 2013-03-30 (×16): qty 1

## 2013-03-30 MED ORDER — ONDANSETRON HCL 4 MG/2ML IJ SOLN: 4.0000 mg | Freq: Four times a day (QID) | INTRAMUSCULAR | Status: DC | PRN

## 2013-03-30 MED ORDER — ONDANSETRON HCL 4 MG PO TABS: 4.0000 mg | ORAL_TABLET | Freq: Four times a day (QID) | ORAL | Status: DC | PRN

## 2013-03-30 MED ORDER — PROMETHAZINE HCL 25 MG/ML IJ SOLN
6.2500 mg | INTRAMUSCULAR | Status: DC | PRN
Start: 2013-03-30 — End: 2013-04-01

## 2013-03-30 NOTE — Progress Notes (Signed)
[facility] PROGRESS NOTE  KORON GODEAUX ZOX:[id number] DOB: Aug 19, 1960 DOA: 03/28/2013 PCP: Default, Provider, MD  HPI: 52 y.o. male with past medical history of alocohol abuse who presented to [facility] ED 03/28/2013 with complaints of generalized abdominal pain for past few days prior to this admission without associated nausea or vomiting. No reports of fever or chills. No diarrhea or constipation. No blood in stool.  In ED, vitals were stable with BP 123/80, HR 56 -72 and T 98.7 F. Oxygen saturation was 97% on room air. CMP revealed AST/ALT and ALP to be 903/1590 and 318. Total bilirubin was 10.1. UDS was negative and urinalysis was negative. CT abdomen was significant for acute cholecystitis. Surgery was consulted by ED physician and recommended medical management.  Assessment/Plan: Transaminitis in the setting of newly diagnosed HCV  - appreciate GI consult and recommendations. T bili worse today, AST/ALT stable - advance diet to clears today - pain management with dilaudid 1 mg every 2 hours PRN IV severe pain  Cholecystitis ETOH abuse  - CIWA ordered  - continue folic acid, thiamine and multivitamin DVT Prophylaxis - heparin  Code Status: Full Family Communication: family in the room  Disposition Plan: inpatient  Consultants:  GI  Procedures:  none  Anti-infectives   None     Antibiotics Given (last 72 hours)   None     HPI/Subjective: - no complaints, is hungry.  Objective: Filed Vitals:   03/29/13 0600 03/29/13 1433 03/29/13 2300 03/30/13 0547  BP: 123/80 131/86 134/86 141/69  Pulse: 72 60 63 63  Temp: 98.4 F (36.9 C) 97.9 F (36.6 C) 97.7 F (36.5 C) 98.3 F (36.8 C)  TempSrc: Oral Oral Oral Oral  Resp: 16 18 18 18   Height:      Weight:      SpO2: 98% 96% 98% 99%   No intake or output data in the 24 hours ending 03/30/13 1125 Filed Weights   03/29/13 0101  Weight: 77.111 kg (170 lb)    Exam:  General:  NAD, jaundiced  Cardiovascular:  regular rate and rhythm, without MRG  Respiratory: good air movement, clear to auscultation throughout, no wheezing, ronchi or rales  Abdomen: soft, not tender to palpation, positive bowel sounds  MSK: no peripheral edema  Neuro: non focal  Data Reviewed: Basic Metabolic Panel:  Recent Labs Lab 03/28/13 2010 03/29/13 0535 03/29/13 1850 03/30/13 0515  NA 136 138 136 137  K 4.2 3.7 4.2 4.0  CL 98 103 101 102  CO2 29 27 25 28   GLUCOSE 98 99 78 76  BUN 9 10 12 13   CREATININE 0.94 1.07 0.90 0.90  CALCIUM 8.8 8.3* 8.8 8.6   Liver Function Tests:  Recent Labs Lab 03/28/13 2010 03/29/13 0535 03/29/13 1850 03/30/13 0515  AST 903* 689* 902* 945*  ALT 1590* 1251* 1401* 1366*  ALKPHOS 318* 267* 288* 260*  BILITOT 10.1* 10.3* 14.1* 14.6*  PROT 7.1 6.0 6.4 6.0  ALBUMIN 2.9* 2.4* 2.7* 2.5*    Recent Labs Lab 03/28/13 2048  LIPASE 57   No results found for this basename: AMMONIA,  in the last 168 hours CBC:  Recent Labs Lab 03/28/13 2010 03/29/13 0535 03/30/13 0515  WBC 10.0 7.8 6.2  NEUTROABS 6.2  --   --   HGB 16.1 14.6 14.3  HCT 45.0 41.3 41.3  MCV 93.8 94.1 94.7  PLT 276 233 218   Studies: Ct Abdomen Pelvis W Contrast  03/28/2013   CLINICAL DATA:  Abdominal pain, back  pain and headache. Jaundice.  EXAM: CT ABDOMEN AND PELVIS WITH CONTRAST  TECHNIQUE: Multidetector CT imaging of the abdomen and pelvis was performed using the standard protocol following bolus administration of intravenous contrast.  CONTRAST:  OMNIPAQUE IOHEXOL 300 MG/ML  SOLN  COMPARISON:  MRI of the lumbar spine performed 12/01/2010  FINDINGS: Minimal bibasilar atelectasis is noted.  A 1.1 cm hypodensity within the right hepatic lobe is nonspecific but may reflect a small cyst. The liver is otherwise unremarkable in appearance. The spleen is mildly enlarged, measuring 13.2 cm in length.  There is mild gallbladder wall thickening, with trace pericholecystic fluid, raising concern for mild  cholecystitis. Trace fluid is noted tracking along the porta hepatis. No definite stones are characterized on CT, though evaluation for stones is limited on CT.  The pancreas and adrenal glands are unremarkable in appearance with  The kidneys are unremarkable in appearance. There is no evidence of hydronephrosis. No renal or ureteral stones are seen. No perinephric stranding is appreciated.  No free fluid is identified. The small bowel is unremarkable in appearance. The stomach is within normal limits. No acute vascular abnormalities are seen. Mild calcification is noted along the common iliac arteries and their branches.  The patient is status post appendectomy. The colon is unremarkable in appearance.  The bladder is moderately distended and grossly unremarkable in appearance. The prostate is mildly enlarged, measuring 5.3 cm in transverse dimension. No inguinal lymphadenopathy is seen.  No acute osseous abnormalities are identified.  IMPRESSION: 1. Suspect mild acute cholecystitis, given mild gallbladder wall thickening and trace pericholecystic fluid. Fluid tracks along the porta hepatis. 2. Mild splenomegaly. 3. Likely small hepatic cyst. 4. Mildly enlarged prostate.   Electronically Signed   By: Roanna Raider M.D.   On: 03/28/2013 22:44    Scheduled Meds: . folic acid  1 mg Oral Daily  . heparin  5,000 Units Subcutaneous Q8H  . multivitamin with minerals  1 tablet Oral Daily  . nicotine  14 mg Transdermal Daily  . thiamine  100 mg Oral Daily   Or  . thiamine  100 mg Intravenous Daily   Continuous Infusions:   Principal Problem:   Transaminitis Active Problems:   ETOH abuse   Cholecystitis, acute  Time spent: 35  Pamella Pert, MD [facility] Pager [phone]. If 7 PM - 7 AM, please contact night-coverage at [email or web address], password [facility] 03/30/2013, 11:25 AM  LOS: 2 days

## 2013-03-30 NOTE — Progress Notes (Signed)
[facility] Progress Note  Subjective:  Feels ok.  Says that he feels like he has to urinate but nothing much comes out.  Has been NPO and not getting any IVF's either.  Renal function is ok.  Would like to eat.  Objective:  Vital signs in last 24 hours: Temp:  [97.7 F (36.5 C)-98.3 F (36.8 C)] 98.3 F (36.8 C) (10/08 0547) Pulse Rate:  [60-63] 63 (10/08 0547) Resp:  [18] 18 (10/08 0547) BP: (131-141)/(69-86) 141/69 mmHg (10/08 0547) SpO2:  [96 %-99 %] 99 % (10/08 0547)   General:   Alert, Well-developed, in NAD; jaundice Heart:  Regular rate and rhythm; no murmurs Pulm:  CTAB.  No W/R/R. Abdomen:  Soft, non-distended.  BS present.  Mild RUQ TTP without R/R/G.   Extremities:  Without edema. Neurologic:  Alert and  oriented x4;  grossly normal neurologically. Psych:  Alert and cooperative. Normal mood and affect.  Lab Results:  Recent Labs  03/28/13 2010 03/29/13 0535 03/30/13 0515  WBC 10.0 7.8 6.2  HGB 16.1 14.6 14.3  HCT 45.0 41.3 41.3  PLT 276 233 218   BMET  Recent Labs  03/29/13 0535 03/29/13 1850 03/30/13 0515  NA 138 136 137  K 3.7 4.2 4.0  CL 103 101 102  CO2 27 25 28   GLUCOSE 99 78 76  BUN 10 12 13   CREATININE 1.07 0.90 0.90  CALCIUM 8.3* 8.8 8.6   LFT  Recent Labs  03/30/13 0515  PROT 6.0  ALBUMIN 2.5*  AST 945*  ALT 1366*  ALKPHOS 260*  BILITOT 14.6*   PT/INR  Recent Labs  03/29/13 0015  LABPROT 14.6  INR 1.16   Hepatitis Panel  Recent Labs  03/29/13 0015  HEPBSAG NEGATIVE  HCVAB Reactive*  HEPAIGM NEGATIVE  HEPBIGM NEGATIVE    Ct Abdomen Pelvis W Contrast  03/28/2013   CLINICAL DATA:  Abdominal pain, back pain and headache. Jaundice.  EXAM: CT ABDOMEN AND PELVIS WITH CONTRAST  TECHNIQUE: Multidetector CT imaging of the abdomen and pelvis was performed using the standard protocol following bolus administration of intravenous contrast.  CONTRAST:  OMNIPAQUE IOHEXOL 300 MG/ML  SOLN  COMPARISON:  MRI of  the lumbar spine performed 12/01/2010  FINDINGS: Minimal bibasilar atelectasis is noted.  A 1.1 cm hypodensity within the right hepatic lobe is nonspecific but may reflect a small cyst. The liver is otherwise unremarkable in appearance. The spleen is mildly enlarged, measuring 13.2 cm in length.  There is mild gallbladder wall thickening, with trace pericholecystic fluid, raising concern for mild cholecystitis. Trace fluid is noted tracking along the porta hepatis. No definite stones are characterized on CT, though evaluation for stones is limited on CT.  The pancreas and adrenal glands are unremarkable in appearance with  The kidneys are unremarkable in appearance. There is no evidence of hydronephrosis. No renal or ureteral stones are seen. No perinephric stranding is appreciated.  No free fluid is identified. The small bowel is unremarkable in appearance. The stomach is within normal limits. No acute vascular abnormalities are seen. Mild calcification is noted along the common iliac arteries and their branches.  The patient is status post appendectomy. The colon is unremarkable in appearance.  The bladder is moderately distended and grossly unremarkable in appearance. The prostate is mildly enlarged, measuring 5.3 cm in transverse dimension. No inguinal lymphadenopathy is seen.  No acute osseous abnormalities are identified.  IMPRESSION: 1. Suspect mild acute cholecystitis, given mild gallbladder wall thickening and trace pericholecystic fluid.  Fluid tracks along the porta hepatis. 2. Mild splenomegaly. 3. Likely small hepatic cyst. 4. Mildly enlarged prostate.   Electronically Signed   By: Roanna Raider M.D.   On: 03/28/2013 22:44    Assessment / Plan: -Hepatitis C:  Likely acute. With significantly elevation in LFT's.  LFT's fairly stable today except for bili, which is trending up.    *Continue to trend LFT's and follow INR as well. *Follow-up HCV viral RNA. *Will advance to regular diet. *Will give  atarax prn for itching. *Usually patients who present with acute Hepatitis C have a high chance of clearing the virus without requiring treatment.   LOS: 2 days   ZEHR, JESSICA D.  03/30/2013, 10:02 AM  Pager number [phone]  I have personally taken an interval history, reviewed the chart, and examined the patient.  I agree with the extender's note, impression and recommendations.  Looks like acute hepatitis C.  OK to discharge once we see transaminases stablelizing or trending down.  Barbette Hair. Arlyce Dice, MD, [facility] [phone]

## 2013-03-31 DIAGNOSIS — B1710 Acute hepatitis C without hepatic coma: Principal | ICD-10-CM

## 2013-03-31 LAB — HEPATIC FUNCTION PANEL
ALT: 1262 U/L — ABNORMAL HIGH (ref 0–53)
AST: 714 U/L — ABNORMAL HIGH (ref 0–37)
Albumin: 2.6 g/dL — ABNORMAL LOW (ref 3.5–5.2)
Alkaline Phosphatase: 242 U/L — ABNORMAL HIGH (ref 39–117)
Bilirubin, Direct: 11.2 mg/dL — ABNORMAL HIGH (ref 0.0–0.3)
Indirect Bilirubin: 4.3 mg/dL — ABNORMAL HIGH (ref 0.3–0.9)
Total Protein: 6.4 g/dL (ref 6.0–8.3)

## 2013-03-31 LAB — PROTIME-INR: Prothrombin Time: 14.2 seconds (ref 11.6–15.2)

## 2013-03-31 NOTE — Progress Notes (Signed)
[facility] Progress Note  Subjective:  Tolerating regular diet.  A lot of generalized aches today.  Objective:  Vital signs in last 24 hours: Temp:  [97.5 F (36.4 C)-97.6 F (36.4 C)] 97.6 F (36.4 C) (10/09 0600) Pulse Rate:  [59-62] 59 (10/09 0600) Resp:  [18] 18 (10/09 0600) BP: (130-161)/(85-91) 161/91 mmHg (10/09 0600) SpO2:  [97 %-99 %] 99 % (10/09 0600)   General:   Alert, Well-developed, in NAD; jaundiced Heart:  Regular rate and rhythm; no murmurs Pulm:  CTAB.  No W/R/R. Abdomen:  Soft, non-distended.  BS present.  Mild RUQ TTP without R/R/G. Extremities:  Without edema. Neurologic:  Alert and  oriented x4;  grossly normal neurologically. Psych:  Alert and cooperative. Normal mood and affect.  Intake/Output from previous day: 10/08 0701 - 10/09 0700 In: 240 [P.O.:240] Out: -   Lab Results:  Recent Labs  03/28/13 2010 03/29/13 0535 03/30/13 0515  WBC 10.0 7.8 6.2  HGB 16.1 14.6 14.3  HCT 45.0 41.3 41.3  PLT 276 233 218   BMET  Recent Labs  03/29/13 0535 03/29/13 1850 03/30/13 0515  NA 138 136 137  K 3.7 4.2 4.0  CL 103 101 102  CO2 27 25 28   GLUCOSE 99 78 76  BUN 10 12 13   CREATININE 1.07 0.90 0.90  CALCIUM 8.3* 8.8 8.6   LFT  Recent Labs  03/31/13 0533  PROT 6.4  ALBUMIN 2.6*  AST 714*  ALT 1262*  ALKPHOS 242*  BILITOT 15.5*  BILIDIR 11.2*  IBILI 4.3*   PT/INR  Recent Labs  03/29/13 0015 03/31/13 0533  LABPROT 14.6 14.2  INR 1.16 1.12   Hepatitis Panel  Recent Labs  03/29/13 0015  HEPBSAG NEGATIVE  HCVAB Reactive*  HEPAIGM NEGATIVE  HEPBIGM NEGATIVE   Assessment / Plan: -Hepatitis C: Likely acute. With significantly elevation in LFT's. LFT's fairly stable today except for bili, which is trending up.   *Continue to trend LFT's and follow INR as well.   *Usually patients who present with acute Hepatitis C have a high chance of clearing the virus without requiring treatment.     LOS: 3 days   ZEHR,  JESSICA D.  03/31/2013, 9:58 AM  Pager number [phone]  LFTs are trending downward and INR is stable.  Repeat LFTs in am.  If enzymes continue to fall can d/c home.  May see  hyperbilirubinemia for an extended period of time.

## 2013-03-31 NOTE — Progress Notes (Signed)
[facility] PROGRESS NOTE  TREMAR WICKENS ZOX:[id number] DOB: 1960/11/23 DOA: 03/28/2013 PCP: Default, Provider, MD  Assessment/Plan: Transaminitis in the setting of newly diagnosed HCV - AST/ALT better, T bili increased but may lag behind.  - eating well - pain management with dilaudid 1 mg every 2 hours PRN IV severe pain  Mild cholecystitis - unlikely the cause for his presentation. Will need follow up with PCP once #1 resolves.  ETOH abuse  - CIWA ordered  - continue folic acid, thiamine and multivitamin DVT Prophylaxis - heparin  Code Status: Full Family Communication: family in the room  Disposition Plan: inpatient  Consultants:  GI  Procedures:  none  HPI/Subjective: - no nausea, eating well, still with some fatigue.   Objective: Filed Vitals:   03/30/13 0547 03/30/13 1403 03/30/13 2200 03/31/13 0600  BP: 141/69 138/90 130/85 161/91  Pulse: 63 62 62 59  Temp: 98.3 F (36.8 C) 97.5 F (36.4 C) 97.6 F (36.4 C) 97.6 F (36.4 C)  TempSrc: Oral Oral Oral Oral  Resp: 18 18 18 18   Height:      Weight:      SpO2: 99% 98% 97% 99%    Intake/Output Summary (Last 24 hours) at 03/31/13 0748 Last data filed at 03/30/13 1500  Gross per 24 hour  Intake    240 ml  Output      0 ml  Net    240 ml   Filed Weights   03/29/13 0101  Weight: 77.111 kg (170 lb)    Exam:  General:  NAD, jaundiced  Cardiovascular: regular rate and rhythm, without MRG  Respiratory: good air movement, clear to auscultation throughout, no wheezing, ronchi or rales  Abdomen: soft, not tender to palpation, positive bowel sounds  MSK: no peripheral edema  Neuro: non focal  Data Reviewed: Basic Metabolic Panel:  Recent Labs Lab 03/28/13 2010 03/29/13 0535 03/29/13 1850 03/30/13 0515  NA 136 138 136 137  K 4.2 3.7 4.2 4.0  CL 98 103 101 102  CO2 29 27 25 28   GLUCOSE 98 99 78 76  BUN 9 10 12 13   CREATININE 0.94 1.07 0.90 0.90  CALCIUM 8.8 8.3* 8.8 8.6   Liver  Function Tests:  Recent Labs Lab 03/28/13 2010 03/29/13 0535 03/29/13 1850 03/30/13 0515 03/31/13 0533  AST 903* 689* 902* 945* 714*  ALT 1590* 1251* 1401* 1366* 1262*  ALKPHOS 318* 267* 288* 260* 242*  BILITOT 10.1* 10.3* 14.1* 14.6* 15.5*  PROT 7.1 6.0 6.4 6.0 6.4  ALBUMIN 2.9* 2.4* 2.7* 2.5* 2.6*    Recent Labs Lab 03/28/13 2048  LIPASE 57   No results found for this basename: AMMONIA,  in the last 168 hours CBC:  Recent Labs Lab 03/28/13 2010 03/29/13 0535 03/30/13 0515  WBC 10.0 7.8 6.2  NEUTROABS 6.2  --   --   HGB 16.1 14.6 14.3  HCT 45.0 41.3 41.3  MCV 93.8 94.1 94.7  PLT 276 233 218   Studies: No results found.  Scheduled Meds: . folic acid  1 mg Oral Daily  . heparin  5,000 Units Subcutaneous Q8H  . multivitamin with minerals  1 tablet Oral Daily  . nicotine  14 mg Transdermal Daily  . polymixin-bacitracin   Topical BID  . thiamine  100 mg Oral Daily   Or  . thiamine  100 mg Intravenous Daily   Continuous Infusions:   Principal Problem:   Transaminitis Active Problems:   ETOH abuse   Cholecystitis, acute  Time spent: 25  Pamella Pert, MD [facility] Pager [phone]. If 7 PM - 7 AM, please contact night-coverage at [email or web address], password [facility] 03/31/2013, 7:48 AM  LOS: 3 days

## 2013-04-01 ENCOUNTER — Encounter: Payer: Self-pay | Admitting: Gastroenterology

## 2013-04-01 LAB — HEPATIC FUNCTION PANEL
ALT: 1124 U/L — ABNORMAL HIGH (ref 0–53)
Albumin: 2.7 g/dL — ABNORMAL LOW (ref 3.5–5.2)
Alkaline Phosphatase: 260 U/L — ABNORMAL HIGH (ref 39–117)
Bilirubin, Direct: 9.7 mg/dL — ABNORMAL HIGH (ref 0.0–0.3)
Indirect Bilirubin: 3.5 mg/dL — ABNORMAL HIGH (ref 0.3–0.9)
Total Bilirubin: 13.2 mg/dL — ABNORMAL HIGH (ref 0.3–1.2)

## 2013-04-01 MED ORDER — TRAMADOL HCL 50 MG PO TABS: 50.0000 mg | ORAL_TABLET | Freq: Three times a day (TID) | ORAL | Status: DC | PRN

## 2013-04-01 MED ORDER — HYDROXYZINE HCL 25 MG PO TABS: 25.0000 mg | ORAL_TABLET | Freq: Three times a day (TID) | ORAL | Status: DC | PRN

## 2013-04-01 NOTE — Progress Notes (Signed)
All DC instructions were reviewed with pt and all questions and concerns were addressed. Pt spoke with the case manager about finding a PCP for follow up in one week. The importance of him doing so (finding and following up with them) was stressed to the pt for his own health and well being. Pt stated that he understood. Pt alert and oriented times four, skin intact although still slightly jaundiced, no breakdown, VSS, NAD, all belongings were sent with the pt. Pt DC home with his son.

## 2013-04-01 NOTE — Progress Notes (Signed)
[facility] Progress Note  Subjective:  Feels ok.  No new complaints.  Objective:  Vital signs in last 24 hours: Temp:  [98 F (36.7 C)-98.8 F (37.1 C)] 98 F (36.7 C) (10/10 0600) Pulse Rate:  [60-68] 67 (10/10 0600) Resp:  [18-20] 18 (10/10 0600) BP: (122-151)/(64-89) 122/64 mmHg (10/10 0600) SpO2:  [96 %-100 %] 100 % (10/10 0600)   General:   Alert, Well-developed, in NAD; jaundiced Heart:  Regular rate and rhythm; no murmurs Pulm:  CTAB.  No W/R/R. Abdomen:  Soft, non-distended.  BS present.  Mild RUQ TTP without R/R/G. Neurologic:  Alert and  oriented x4;  grossly normal neurologically. Psych:  Alert and cooperative. Normal mood and affect.  Intake/Output from previous day: 10/09 0701 - 10/10 0700 In: 840 [P.O.:840] Out: -  Intake/Output this shift: Total I/O In: 480 [P.O.:480] Out: -   Lab Results:  Recent Labs  03/30/13 0515  WBC 6.2  HGB 14.3  HCT 41.3  PLT 218   BMET  Recent Labs  03/29/13 1850 03/30/13 0515  NA 136 137  K 4.2 4.0  CL 101 102  CO2 25 28  GLUCOSE 78 76  BUN 12 13  CREATININE 0.90 0.90  CALCIUM 8.8 8.6   LFT  Recent Labs  04/01/13 0530  PROT 7.0  ALBUMIN 2.7*  AST 517*  ALT 1124*  ALKPHOS 260*  BILITOT 13.2*  BILIDIR 9.7*  IBILI 3.5*   PT/INR  Recent Labs  03/31/13 0533  LABPROT 14.2  INR 1.12   Assessment / Plan: -Hepatitis C: Likely acute. With significantly elevation in LFT's. LFT's trending down.  May see persist hyperbilirubinemia for an extended period of time.   *Recheck LFT's the beginning of next week. *Follow-up with Dr. Arlyce Dice in 6 weeks (appointment is listed in discharge instructions).  Follow-up with a PCP next week. *Usually patients who present with acute Hepatitis C have a high chance of clearing the virus without requiring treatment.     LOS: 4 days   ZEHR, JESSICA D.  04/01/2013, 9:23 AM  Pager number [phone]  I have personally taken an interval history, reviewed the  chart, and examined the patient.  I agree with the extender's note, impression and recommendations.  Barbette Hair. Arlyce Dice, MD, [facility] [phone]

## 2013-04-01 NOTE — Progress Notes (Signed)
04/01/2013  Patient:  Brian Huynh, Brian Huynh   Account Number:  [id number]  Date Initiated:  03/29/2013  Documentation initiated by:  [facility]  Subjective/Objective Assessment:   52 year old male admitted with abdominal pain.     Action/Plan:   From home.   Anticipated DC Date:  04/01/2013   Anticipated DC Plan:  HOME/SELF CARE    DC Planning Services  CM consult      Choice offered to / List presented to:       Status of service:  In process, will continue to follow  Medicare Important Message given?  NA - LOS <3 / Initial given by admissions  Per UR Regulation:  Reviewed for med. necessity/level of care/duration of stay    Comments:  04/01/13 Algernon Huxley RN BSN [phone] I provided pt with information on the [facility] so that he can make an appointment to establish care and for post hospitalization follow up. Pt appreciative of information.
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
## 2013-05-13 ENCOUNTER — Ambulatory Visit: Payer: Self-pay | Admitting: Gastroenterology

## 2013-05-13 ENCOUNTER — Other Ambulatory Visit (INDEPENDENT_AMBULATORY_CARE_PROVIDER_SITE_OTHER): Payer: Self-pay

## 2013-05-13 ENCOUNTER — Encounter: Payer: Self-pay | Admitting: Gastroenterology

## 2013-05-13 ENCOUNTER — Ambulatory Visit (INDEPENDENT_AMBULATORY_CARE_PROVIDER_SITE_OTHER): Payer: Self-pay | Admitting: Gastroenterology

## 2013-05-13 VITALS — BP 110/80 | HR 108 | Ht 70.25 in | Wt 179.5 lb

## 2013-05-13 DIAGNOSIS — B1920 Unspecified viral hepatitis C without hepatic coma: Secondary | ICD-10-CM

## 2013-05-13 DIAGNOSIS — Z1211 Encounter for screening for malignant neoplasm of colon: Secondary | ICD-10-CM

## 2013-05-13 DIAGNOSIS — K810 Acute cholecystitis: Secondary | ICD-10-CM

## 2013-05-13 DIAGNOSIS — B1710 Acute hepatitis C without hepatic coma: Secondary | ICD-10-CM

## 2013-05-13 DIAGNOSIS — G2581 Restless legs syndrome: Secondary | ICD-10-CM

## 2013-05-13 LAB — CBC WITH DIFFERENTIAL/PLATELET
Basophils Relative: 0.2 % (ref 0.0–3.0)
Eosinophils Relative: 1.5 % (ref 0.0–5.0)
Lymphocytes Relative: 19.4 % (ref 12.0–46.0)
Monocytes Relative: 5.1 % (ref 3.0–12.0)
Neutrophils Relative %: 73.8 % (ref 43.0–77.0)
Platelets: 305 10*3/uL (ref 150.0–400.0)
RBC: 5.35 Mil/uL (ref 4.22–5.81)
WBC: 15.4 10*3/uL — ABNORMAL HIGH (ref 4.5–10.5)

## 2013-05-13 LAB — HEPATIC FUNCTION PANEL
ALT: 25 U/L (ref 0–53)
Albumin: 3.8 g/dL (ref 3.5–5.2)
Bilirubin, Direct: 0.4 mg/dL — ABNORMAL HIGH (ref 0.0–0.3)
Total Protein: 7.8 g/dL (ref 6.0–8.3)

## 2013-05-13 LAB — PROTIME-INR
INR: 1.1 ratio — ABNORMAL HIGH (ref 0.8–1.0)
Prothrombin Time: 11.6 s (ref 10.2–12.4)

## 2013-05-13 MED ORDER — TRAMADOL HCL 50 MG PO TABS: 50.0000 mg | ORAL_TABLET | Freq: Four times a day (QID) | ORAL | Status: DC | PRN

## 2013-05-13 MED ORDER — PRAMIPEXOLE DIHYDROCHLORIDE 0.125 MG PO TABS: ORAL_TABLET | ORAL | Status: DC

## 2013-05-13 NOTE — Patient Instructions (Addendum)
You have been given a separate informational sheet regarding your tobacco use, the importance of quitting and local resources to help you quit. Go to the basement for labs Follow up in 6 weeks

## 2013-05-13 NOTE — Addendum Note (Signed)
Addended by: Marlowe Kays on: 05/13/2013 01:03 PM   Modules accepted: Orders

## 2013-05-13 NOTE — Assessment & Plan Note (Signed)
Schedule screening colonoscopy once hepatitis has resolved

## 2013-05-13 NOTE — Assessment & Plan Note (Signed)
Clinically improving.  Recommendations #1 followup LFTs #2 repeat HCV RNA in approximately 6 weeks

## 2013-05-13 NOTE — Assessment & Plan Note (Signed)
Begin Mirapex 0.125 mg daily
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
## 2013-05-13 NOTE — Assessment & Plan Note (Signed)
Although he had some pericholecystic fluid by ultrasound I think it was more likely that right upper quadrant pain was due to hepatitis than due to gallstones.  He still has some residual right upper quadrant pain.  If this persists I will obtain a HIDA scan.

## 2013-07-04 ENCOUNTER — Telehealth: Payer: Self-pay | Admitting: *Deleted

## 2013-07-04 DIAGNOSIS — B182 Chronic viral hepatitis C: Secondary | ICD-10-CM

## 2013-07-04 NOTE — Telephone Encounter (Signed)
If he still having problems with restless legs he should contact his PCP (mirapex was prescribed for that).  He needs GI followup.

## 2013-07-04 NOTE — Telephone Encounter (Signed)
Message copied by Oda Kilts on Mon Jul 04, 2013  2:41 PM ------      Message from: Oda Kilts      Created: Fri May 13, 2013 12:41 PM      Regarding: labs in 6 weeks       Remind pt to get labs 12/29 ------

## 2013-07-04 NOTE — Telephone Encounter (Signed)
Dr Deatra Ina, you sent in Mirapex for this patient during his visit in [place]  He is just now telling me when I called him about a lab reminder that he was never able to afford it

## 2013-07-06 NOTE — Telephone Encounter (Signed)
Scheduled patient a follow up appointment for 08/15/2013 at 10:15am

## 2013-07-12 ENCOUNTER — Telehealth: Payer: Self-pay | Admitting: Gastroenterology

## 2013-07-12 DIAGNOSIS — K810 Acute cholecystitis: Secondary | ICD-10-CM

## 2013-07-12 MED ORDER — TRAMADOL HCL 50 MG PO TABS
50.0000 mg | ORAL_TABLET | Freq: Four times a day (QID) | ORAL | Status: DC | PRN
Start: 2013-07-12 — End: 2013-08-16

## 2013-07-12 NOTE — Telephone Encounter (Signed)
Because it was on his med list and he said he was having pain and it helped him before. With pain he was having. I told him he would have to go to check with [facility] and try to find him a PCP. I told him, to get finiacial help through [facility]. I also told him we would only fill that for him this time. But we did prescribe it for him at his last visit.

## 2013-07-12 NOTE — Telephone Encounter (Signed)
Dr Deatra Ina, I sent in Tramadol for this patient he does not have insurance and he has no PCP. The Mirapax for RLS was too expensive for him

## 2013-07-12 NOTE — Telephone Encounter (Signed)
Why tramadol?

## 2013-07-13 NOTE — Telephone Encounter (Signed)
ok 

## 2013-08-12 ENCOUNTER — Telehealth: Payer: Self-pay | Admitting: *Deleted

## 2013-08-12 NOTE — Telephone Encounter (Signed)
Tried to contact pt to come in and have labs drawn    Couldn't get call through because of [employer] prescriber

## 2013-08-15 ENCOUNTER — Ambulatory Visit: Payer: Self-pay | Admitting: Gastroenterology

## 2013-08-16 ENCOUNTER — Encounter (HOSPITAL_COMMUNITY): Payer: Self-pay | Admitting: Emergency Medicine

## 2013-08-16 ENCOUNTER — Emergency Department (HOSPITAL_COMMUNITY)
Admission: EM | Admit: 2013-08-16 | Discharge: 2013-08-17 | Disposition: A | Discharge status: Home / Self Care | Payer: Self-pay | Attending: Emergency Medicine | Admitting: Emergency Medicine

## 2013-08-16 ENCOUNTER — Emergency Department (HOSPITAL_COMMUNITY): Payer: Self-pay

## 2013-08-16 DIAGNOSIS — R109 Unspecified abdominal pain: Secondary | ICD-10-CM

## 2013-08-16 DIAGNOSIS — R34 Anuria and oliguria: Secondary | ICD-10-CM | POA: Insufficient documentation

## 2013-08-16 DIAGNOSIS — K5900 Constipation, unspecified: Secondary | ICD-10-CM | POA: Insufficient documentation

## 2013-08-16 DIAGNOSIS — G8929 Other chronic pain: Secondary | ICD-10-CM | POA: Insufficient documentation

## 2013-08-16 DIAGNOSIS — K5641 Fecal impaction: Secondary | ICD-10-CM | POA: Insufficient documentation

## 2013-08-16 DIAGNOSIS — K5649 Other impaction of intestine: Secondary | ICD-10-CM

## 2013-08-16 DIAGNOSIS — Z8619 Personal history of other infectious and parasitic diseases: Secondary | ICD-10-CM | POA: Insufficient documentation

## 2013-08-16 DIAGNOSIS — M549 Dorsalgia, unspecified: Secondary | ICD-10-CM | POA: Insufficient documentation

## 2013-08-16 DIAGNOSIS — F17200 Nicotine dependence, unspecified, uncomplicated: Secondary | ICD-10-CM | POA: Insufficient documentation

## 2013-08-16 LAB — COMPREHENSIVE METABOLIC PANEL
ALT: 13 U/L (ref 0–53)
AST: 16 U/L (ref 0–37)
Albumin: 3.7 g/dL (ref 3.5–5.2)
Alkaline Phosphatase: 73 U/L (ref 39–117)
BUN: 17 mg/dL (ref 6–23)
CALCIUM: 9.1 mg/dL (ref 8.4–10.5)
CO2: 26 mEq/L (ref 19–32)
CREATININE: 1.09 mg/dL (ref 0.50–1.35)
Chloride: 102 mEq/L (ref 96–112)
GFR calc Af Amer: 88 mL/min — ABNORMAL LOW (ref >90)
GFR, EST NON AFRICAN AMERICAN: 76 mL/min — AB (ref >90)
Glucose, Bld: 98 mg/dL (ref 70–99)
Potassium: 4.7 mEq/L (ref 3.7–5.3)
Sodium: 139 mEq/L (ref 137–147)
Total Bilirubin: 0.5 mg/dL (ref 0.3–1.2)
Total Protein: 7.8 g/dL (ref 6.0–8.3)

## 2013-08-16 LAB — CBC WITH DIFFERENTIAL/PLATELET
Basophils Absolute: 0.1 10*3/uL (ref 0.0–0.1)
Basophils Relative: 1 % (ref 0–1)
Eosinophils Absolute: 0.2 10*3/uL (ref 0.0–0.7)
Eosinophils Relative: 2 % (ref 0–5)
HCT: 45.1 % (ref 39.0–52.0)
HEMOGLOBIN: 16.3 g/dL (ref 13.0–17.0)
LYMPHS ABS: 3.2 10*3/uL (ref 0.7–4.0)
Lymphocytes Relative: 32 % (ref 12–46)
MCH: 32.9 pg (ref 26.0–34.0)
MCHC: 36.1 g/dL — ABNORMAL HIGH (ref 30.0–36.0)
MCV: 90.9 fL (ref 78.0–100.0)
Monocytes Absolute: 0.9 10*3/uL (ref 0.1–1.0)
Monocytes Relative: 8 % (ref 3–12)
NEUTROS PCT: 57 % (ref 43–77)
Neutro Abs: 5.8 10*3/uL (ref 1.7–7.7)
Platelets: 245 10*3/uL (ref 150–400)
RBC: 4.96 MIL/uL (ref 4.22–5.81)
RDW: 12.9 % (ref 11.5–15.5)
WBC: 10.1 10*3/uL (ref 4.0–10.5)

## 2013-08-16 LAB — LIPASE, BLOOD: Lipase: 26 U/L (ref 11–59)

## 2013-08-16 MED ORDER — POLYETHYLENE GLYCOL 3350 17 G PO PACK
17.0000 g | PACK | Freq: Every day | ORAL | Status: DC
Start: 2013-08-17 — End: 2013-08-17
  Administered 2013-08-17: 17 g via ORAL
  Filled 2013-08-16 (×2): qty 1

## 2013-08-16 MED ORDER — FLEET ENEMA 7-19 GM/118ML RE ENEM
1.0000 | ENEMA | Freq: Once | RECTAL | Status: AC
  Administered 2013-08-17: 1 via RECTAL
  Filled 2013-08-16: qty 1

## 2013-08-16 NOTE — ED Notes (Signed)
Pt reports that he has been having constipation for the past 4 days, reports abdominal pain, denies taking any medication to relieve the symptoms, states that he has some leaking of stool, but is unable to have a BM. Pt a&o x4, ambulatory to triage.

## 2013-08-16 NOTE — ED Provider Notes (Signed)
CSN: [id number]     Arrival date & time 08/16/13  2109 History   First MD Initiated Contact with Patient 08/16/13 2303     Chief Complaint  Patient presents with  . Abdominal Pain  . Constipation     (Consider location/radiation/quality/duration/timing/severity/associated sxs/prior Treatment) HPI Comments: 53 yo male with etoh abuse, hep C, LFT elevation hx presents with diffuse cramping abdo pain, worse than normal. No BM for 5 days, mild leakage. No narcotics or new meds.  Chronic back pain hx.  Mild decreased urination.  No known prostate issues.  Nothing improves but has not tried stool softeners.  Pt eats poor diet.    Patient is a 53 y.o. male presenting with abdominal pain and constipation. The history is provided by the patient.  Abdominal Pain Pain location:  Generalized Associated symptoms: constipation   Associated symptoms: no chest pain, no chills, no dysuria, no fever, no nausea, no shortness of breath and no vomiting   Constipation Associated symptoms: abdominal pain and back pain (chronic)   Associated symptoms: no dysuria, no fever, no nausea and no vomiting     Past Medical History  Diagnosis Date  . Chronic back pain   . Sciatica   . Hepatitis C   . Cholecystitis   . Transaminitis   . Alcohol abuse   . Jaundice    Past Surgical History  Procedure Laterality Date  . Back surgery      x 2  . Appendectomy    . Orchiopexy Right   . Inguinal hernia repair Right    Family History  Problem Relation Age of Onset  . Stroke Brother     x 2 brothers  . AAA (abdominal aortic aneurysm) Father   . Diabetes Mother   . Lupus Mother    History  Substance Use Topics  . Smoking status: Current Some Day Smoker -- 1.00 packs/day    Types: Cigarettes  . Smokeless tobacco: Never Used  . Alcohol Use: Yes    Review of Systems  Constitutional: Negative for fever and chills.  HENT: Negative for congestion.   Eyes: Negative for visual disturbance.  Respiratory:  Negative for shortness of breath.   Cardiovascular: Negative for chest pain.  Gastrointestinal: Positive for abdominal pain and constipation. Negative for nausea, vomiting and blood in stool.  Genitourinary: Negative for dysuria and flank pain.  Musculoskeletal: Positive for back pain (chronic). Negative for neck pain and neck stiffness.  Skin: Negative for rash.  Neurological: Negative for weakness, light-headedness, numbness and headaches.      Allergies  Acetaminophen  Home Medications  No current outpatient prescriptions on file. BP 155/107  Pulse 83  Temp(Src) 98.7 F (37.1 C) (Oral)  Resp 18  Ht 5\' 11"  (1.803 m)  Wt 175 lb (79.379 kg)  BMI 24.42 kg/m2  SpO2 99% Physical Exam  Nursing note and vitals reviewed. Constitutional: He is oriented to person, place, and time. He appears well-developed and well-nourished.  HENT:  Head: Normocephalic and atraumatic.  Eyes: Conjunctivae are normal. Right eye exhibits no discharge. Left eye exhibits no discharge.  Neck: Normal range of motion. Neck supple. No tracheal deviation present.  Cardiovascular: Normal rate and regular rhythm.   Pulmonary/Chest: Effort normal and breath sounds normal.  Abdominal: Soft. He exhibits no distension. There is tenderness (diffuse lower tender, worse LLQ). There is no guarding.  Genitourinary:  Full stool on rectal exam, no abscess  Musculoskeletal: He exhibits no edema.  Neurological: He is alert and oriented to  person, place, and time.  Skin: Skin is warm. No rash noted.  Psychiatric: He has a normal mood and affect.    ED Course  Procedures (including critical care time) Labs Review Labs Reviewed  COMPREHENSIVE METABOLIC PANEL - Abnormal; Notable for the following:    GFR calc non Af Amer 76 (*)    GFR calc Af Amer 88 (*)    All other components within normal limits  CBC WITH DIFFERENTIAL - Abnormal; Notable for the following:    MCHC 36.1 (*)    All other components within normal  limits  LIPASE, BLOOD  URINALYSIS, ROUTINE W REFLEX MICROSCOPIC   Imaging Review Dg Abd 2 Views  08/17/2013   CLINICAL DATA:  Abdominal pain and constipation.  EXAM: ABDOMEN - 2 VIEW  COMPARISON:  03/08/2012  FINDINGS: A large amount of rectal stool is noted.  Moderate colonic stool is present.  There is no evidence of bowel obstruction or pneumoperitoneum.  No bony abnormalities are identified.  There is no evidence of pneumoperitoneum or suspicious calcifications.  IMPRESSION: Large amount of rectal stool which may be impacted. Moderate colonic stool within the remainder of the colon without evidence of bowel obstruction.   Electronically Signed   By: Hassan Rowan M.D.   On: 08/17/2013 00:23    EKG Interpretation   None       MDM   Final diagnoses:  Constipation  Abdominal cramping  Impaction of colon   Clinically stool impaction/ constipation. Miralax in ED. No vomiting, no obstruction on xray. Well appearing. Recommended manual disimpaction with ativan, pt refused prefers po meds and fup outpt.  Results and differential diagnosis were discussed with the patient. Close follow up outpatient was discussed, patient comfortable with the plan.         Mariea Clonts, MD 08/17/13 [phone]

## 2013-08-17 LAB — URINALYSIS, ROUTINE W REFLEX MICROSCOPIC
Bilirubin Urine: NEGATIVE
Glucose, UA: NEGATIVE mg/dL
Hgb urine dipstick: NEGATIVE
Ketones, ur: NEGATIVE mg/dL
Leukocytes, UA: NEGATIVE
Nitrite: NEGATIVE
PH: 6 (ref 5.0–8.0)
Protein, ur: NEGATIVE mg/dL
SPECIFIC GRAVITY, URINE: 1.01 (ref 1.005–1.030)
Urobilinogen, UA: 1 mg/dL (ref 0.0–1.0)

## 2013-08-17 MED ORDER — MAGNESIUM CITRATE PO SOLN
1.0000 | Freq: Once | ORAL | Status: AC
  Administered 2013-08-17: 1 via ORAL
  Filled 2013-08-17: qty 296

## 2013-08-17 MED ORDER — POLYETHYLENE GLYCOL 3350 17 G PO PACK: 17.0000 g | PACK | Freq: Every day | ORAL | Status: DC

## 2013-08-17 NOTE — ED Notes (Signed)
Pt has had used the bathroom---pt reports that he has large BM at this time.

## 2013-08-17 NOTE — Discharge Instructions (Signed)
Take miralax daily until normal stools.  Stay well hydrated. Return for fevers, vomiting, focal pain or new concerns.  If you were given medicines take as directed.  If you are on coumadin or contraceptives realize their levels and effectiveness is altered by many different medicines.  If you have any reaction (rash, tongues swelling, other) to the medicines stop taking and see a physician.   Please follow up as directed and return to the ER or see a physician for new or worsening symptoms.  Thank you.

## 2014-02-14 ENCOUNTER — Ambulatory Visit: Payer: Self-pay

## 2014-10-29 ENCOUNTER — Emergency Department (HOSPITAL_COMMUNITY): Payer: Self-pay

## 2014-10-29 ENCOUNTER — Emergency Department (HOSPITAL_COMMUNITY)
Admission: EM | Admit: 2014-10-29 | Discharge: 2014-10-30 | Disposition: A | Discharge status: Home / Self Care | Payer: Self-pay | Attending: Emergency Medicine | Admitting: Emergency Medicine

## 2014-10-29 ENCOUNTER — Encounter (HOSPITAL_COMMUNITY): Payer: Self-pay

## 2014-10-29 DIAGNOSIS — Z720 Tobacco use: Secondary | ICD-10-CM | POA: Insufficient documentation

## 2014-10-29 DIAGNOSIS — R1011 Right upper quadrant pain: Secondary | ICD-10-CM | POA: Insufficient documentation

## 2014-10-29 DIAGNOSIS — Z8619 Personal history of other infectious and parasitic diseases: Secondary | ICD-10-CM | POA: Insufficient documentation

## 2014-10-29 DIAGNOSIS — G8929 Other chronic pain: Secondary | ICD-10-CM | POA: Insufficient documentation

## 2014-10-29 DIAGNOSIS — R52 Pain, unspecified: Secondary | ICD-10-CM

## 2014-10-29 DIAGNOSIS — Z8739 Personal history of other diseases of the musculoskeletal system and connective tissue: Secondary | ICD-10-CM | POA: Insufficient documentation

## 2014-10-29 LAB — COMPREHENSIVE METABOLIC PANEL
ALT: 14 U/L — AB (ref 17–63)
AST: 17 U/L (ref 15–41)
Albumin: 3.9 g/dL (ref 3.5–5.0)
Alkaline Phosphatase: 66 U/L (ref 38–126)
Anion gap: 5 (ref 5–15)
BUN: 18 mg/dL (ref 6–20)
CALCIUM: 9.2 mg/dL (ref 8.9–10.3)
CO2: 27 mmol/L (ref 22–32)
Chloride: 108 mmol/L (ref 101–111)
Creatinine, Ser: 1.21 mg/dL (ref 0.61–1.24)
GFR calc Af Amer: >60 mL/min (ref >60)
GFR calc non Af Amer: >60 mL/min (ref >60)
Glucose, Bld: 95 mg/dL (ref 70–99)
POTASSIUM: 4.6 mmol/L (ref 3.5–5.1)
SODIUM: 140 mmol/L (ref 135–145)
Total Bilirubin: 0.6 mg/dL (ref 0.3–1.2)
Total Protein: 7.3 g/dL (ref 6.5–8.1)

## 2014-10-29 LAB — CBC WITH DIFFERENTIAL/PLATELET
BASOS PCT: 0 % (ref 0–1)
Basophils Absolute: 0 10*3/uL (ref 0.0–0.1)
Eosinophils Absolute: 0.4 10*3/uL (ref 0.0–0.7)
Eosinophils Relative: 3 % (ref 0–5)
HCT: 47.3 % (ref 39.0–52.0)
HEMOGLOBIN: 16.3 g/dL (ref 13.0–17.0)
LYMPHS ABS: 4.4 10*3/uL — AB (ref 0.7–4.0)
Lymphocytes Relative: 30 % (ref 12–46)
MCH: 31.7 pg (ref 26.0–34.0)
MCHC: 34.5 g/dL (ref 30.0–36.0)
MCV: 91.8 fL (ref 78.0–100.0)
MONOS PCT: 7 % (ref 3–12)
Monocytes Absolute: 1.1 10*3/uL — ABNORMAL HIGH (ref 0.1–1.0)
NEUTROS PCT: 60 % (ref 43–77)
Neutro Abs: 8.8 10*3/uL — ABNORMAL HIGH (ref 1.7–7.7)
Platelets: 234 10*3/uL (ref 150–400)
RBC: 5.15 MIL/uL (ref 4.22–5.81)
RDW: 13.5 % (ref 11.5–15.5)
WBC: 14.7 10*3/uL — AB (ref 4.0–10.5)

## 2014-10-29 LAB — URINALYSIS, ROUTINE W REFLEX MICROSCOPIC
Bilirubin Urine: NEGATIVE
GLUCOSE, UA: NEGATIVE mg/dL
Hgb urine dipstick: NEGATIVE
Ketones, ur: NEGATIVE mg/dL
Leukocytes, UA: NEGATIVE
Nitrite: NEGATIVE
Protein, ur: NEGATIVE mg/dL
Specific Gravity, Urine: 1.008 (ref 1.005–1.030)
Urobilinogen, UA: 1 mg/dL (ref 0.0–1.0)
pH: 7 (ref 5.0–8.0)

## 2014-10-29 LAB — POC OCCULT BLOOD, ED: Fecal Occult Bld: NEGATIVE

## 2014-10-29 LAB — PROTIME-INR
INR: 0.94 (ref 0.00–1.49)
Prothrombin Time: 12.7 seconds (ref 11.6–15.2)

## 2014-10-29 LAB — LIPASE, BLOOD: Lipase: 36 U/L (ref 22–51)

## 2014-10-29 MED ORDER — IOHEXOL 300 MG/ML  SOLN: 25.0000 mL | Freq: Once | INTRAMUSCULAR | Status: AC | PRN

## 2014-10-29 MED ORDER — SODIUM CHLORIDE 0.9 % IV BOLUS (SEPSIS)
1000.0000 mL | Freq: Once | INTRAVENOUS | Status: AC
  Administered 2014-10-29: 1000 mL via INTRAVENOUS

## 2014-10-29 MED ORDER — HYDROMORPHONE HCL 1 MG/ML IJ SOLN
1.0000 mg | Freq: Once | INTRAMUSCULAR | Status: AC
Start: 2014-10-29 — End: 2014-10-29
  Administered 2014-10-29: 1 mg via INTRAVENOUS
  Filled 2014-10-29: qty 1

## 2014-10-29 MED ORDER — METOCLOPRAMIDE HCL 5 MG/ML IJ SOLN
5.0000 mg | Freq: Once | INTRAMUSCULAR | Status: AC
  Administered 2014-10-29: 5 mg via INTRAVENOUS
  Filled 2014-10-29: qty 2

## 2014-10-29 NOTE — ED Notes (Signed)
Pt presents with c/o rectal bleeding that started today. Pt reports his "liver started hurting last night". Pt reports he is having some abdominal pain as well, hx of hep C. Pt reports the bleeding is a moderate amount, one episode today.

## 2014-10-29 NOTE — ED Provider Notes (Signed)
CSN: [id number]     Arrival date & time 10/29/14  1939 History   First MD Initiated Contact with Patient 10/29/14 2106     Chief Complaint  Patient presents with  . Rectal Bleeding  . Abdominal Pain     (Consider location/radiation/quality/duration/timing/severity/associated sxs/prior Treatment) HPI Complains of abdominal pain at right upper quadrant onset this morning. Similar pain he's had in the past and hepatitis. Pain is nonradiating. He also admits to blood per rectum today. States he had a watery bowel movement mixed with blood. He treated himself with ibuprofen, without relief. Associated symptoms include nausea. No vomiting. No other associated symptoms. Nothing makes pain better or worse. Pain is constant, unchanging. Cramping in quality. Past Medical History  Diagnosis Date  . Chronic back pain   . Sciatica   . Hepatitis C   . Cholecystitis   . Transaminitis   . Alcohol abuse   . Jaundice    Past Surgical History  Procedure Laterality Date  . Back surgery      x 2  . Appendectomy    . Orchiopexy Right   . Inguinal hernia repair Right    Family History  Problem Relation Age of Onset  . Stroke Brother     x 2 brothers  . AAA (abdominal aortic aneurysm) Father   . Diabetes Mother   . Lupus Mother    History  Substance Use Topics  . Smoking status: Current Some Day Smoker -- 1.00 packs/day    Types: Cigarettes  . Smokeless tobacco: Never Used  . Alcohol Use: Yes    Review of Systems  Constitutional: Negative.   HENT: Negative.   Respiratory: Negative.   Cardiovascular: Negative.   Gastrointestinal: Positive for nausea, abdominal pain and blood in stool.  Musculoskeletal: Negative.   Skin: Negative.   Neurological: Negative.   Psychiatric/Behavioral: Negative.   All other systems reviewed and are negative.     Allergies  Acetaminophen  Home Medications   Prior to Admission medications   Medication Sig Start Date End Date Taking? Authorizing  Provider  ibuprofen (ADVIL,MOTRIN) 200 MG tablet Take 600 mg by mouth every 6 (six) hours as needed for headache, mild pain or moderate pain.   Yes Historical Provider, MD  polyethylene glycol (MIRALAX / GLYCOLAX) packet Take 17 g by mouth daily. Patient not taking: Reported on 10/29/2014 08/17/13   Elnora Morrison, MD   BP 153/85 mmHg  Pulse 87  Temp(Src) 98.1 F (36.7 C) (Oral)  Resp 20  SpO2 100% Physical Exam  Constitutional: He is oriented to person, place, and time. He appears well-developed and well-nourished. No distress.  HENT:  Head: Normocephalic and atraumatic.  Eyes: Conjunctivae are normal. Pupils are equal, round, and reactive to light.  Neck: Neck supple. No tracheal deviation present. No thyromegaly present.  Cardiovascular: Normal rate and regular rhythm.   No murmur heard. Pulmonary/Chest: Effort normal and breath sounds normal.  Abdominal: Soft. Bowel sounds are normal. He exhibits no distension and no mass. There is tenderness. There is no rebound and no guarding.  Tender at right upper quadrant  Genitourinary: Rectum normal, prostate normal and penis normal. Guaiac negative stool.  Scrotum normal  Musculoskeletal: Normal range of motion. He exhibits no edema or tenderness.  Neurological: He is alert and oriented to person, place, and time. Coordination normal.  Skin: Skin is warm and dry. No rash noted.  Psychiatric: He has a normal mood and affect.  Nursing note and vitals reviewed.   ED Course  Procedures (including critical care time) Labs Review Labs Reviewed  CBC WITH DIFFERENTIAL/PLATELET - Abnormal; Notable for the following:    WBC 14.7 (*)    Neutro Abs 8.8 (*)    Lymphs Abs 4.4 (*)    Monocytes Absolute 1.1 (*)    All other components within normal limits  COMPREHENSIVE METABOLIC PANEL - Abnormal; Notable for the following:    ALT 14 (*)    All other components within normal limits  URINALYSIS, ROUTINE W REFLEX MICROSCOPIC    Imaging  Review No results found.   EKG Interpretation None     11:15 PM pain and nausea under control after treatment with intravenous opioids and antiemetics MDM  Pt signed out to Dr. Roxanne Mins 3729 am Final diagnoses:  None   Dx abdominal pain     Orlie Dakin, MD 10/30/14 0211

## 2014-10-30 ENCOUNTER — Encounter (HOSPITAL_COMMUNITY): Payer: Self-pay

## 2014-10-30 MED ORDER — IOHEXOL 300 MG/ML  SOLN
100.0000 mL | Freq: Once | INTRAMUSCULAR | Status: AC | PRN
  Administered 2014-10-30: 100 mL via INTRAVENOUS

## 2014-10-30 MED ORDER — IOHEXOL 300 MG/ML  SOLN
25.0000 mL | Freq: Once | INTRAMUSCULAR | Status: AC | PRN
  Administered 2014-10-29: 25 mL via ORAL

## 2014-10-30 MED ORDER — OXYCODONE HCL 5 MG PO TABS
5.0000 mg | ORAL_TABLET | ORAL | Status: DC | PRN
Start: 2014-10-30 — End: 2018-02-03

## 2014-10-30 NOTE — Discharge Instructions (Signed)
Abdominal Pain Contact the [facility] tomorrow to get a primary care physician Many things can cause abdominal pain. Usually, abdominal pain is not caused by a disease and will improve without treatment. It can often be observed and treated at home. Your health care provider will do a physical exam and possibly order blood tests and X-rays to help determine the seriousness of your pain. However, in many cases, more time must pass before a clear cause of the pain can be found. Before that point, your health care provider may not know if you need more testing or further treatment. HOME CARE INSTRUCTIONS  Monitor your abdominal pain for any changes. The following actions may help to alleviate any discomfort you are experiencing:  Only take over-the-counter or prescription medicines as directed by your health care provider.  Do not take laxatives unless directed to do so by your health care provider.  Try a clear liquid diet (broth, tea, or water) as directed by your health care provider. Slowly move to a bland diet as tolerated. SEEK MEDICAL CARE IF:  You have unexplained abdominal pain.  You have abdominal pain associated with nausea or diarrhea.  You have pain when you urinate or have a bowel movement.  You experience abdominal pain that wakes you in the night.  You have abdominal pain that is worsened or improved by eating food.  You have abdominal pain that is worsened with eating fatty foods.  You have a fever. SEEK IMMEDIATE MEDICAL CARE IF:   Your pain does not go away within 2 hours.  You keep throwing up (vomiting).  Your pain is felt only in portions of the abdomen, such as the right side or the left lower portion of the abdomen.  You pass bloody or black tarry stools. MAKE SURE YOU:  Understand these instructions.   Will watch your condition.   Will get help right away if you are not doing well or get worse.  Document Released: 03/19/2005  Document Revised: 06/14/2013 Document Reviewed: 02/16/2013 [facility] Patient Information 2015 [place], [place]. This information is not intended to replace advice given to you by your health care provider. Make sure you discuss any questions you have with your health care provider.

## 2014-10-30 NOTE — ED Provider Notes (Signed)
Patient initially seen and evaluated by Dr. Winfred Leeds for rectal bleeding and abdominal pain. He was sent for CT scan to make sure there is no serious intra-abdominal pathology. CT has come back unremarkable. He is complaining of ongoing abdominal pain and is discharged with a prescription for oxycodone and is referred to [facility] for follow-up in 2-3 days.   Results for orders placed or performed during the hospital encounter of 10/29/14  CBC with Differential  Result Value Ref Range   WBC 14.7 (H) 4.0 - 10.5 K/uL   RBC 5.15 4.22 - 5.81 MIL/uL   Hemoglobin 16.3 13.0 - 17.0 g/dL   HCT 47.3 39.0 - 52.0 %   MCV 91.8 78.0 - 100.0 fL   MCH 31.7 26.0 - 34.0 pg   MCHC 34.5 30.0 - 36.0 g/dL   RDW 13.5 11.5 - 15.5 %   Platelets 234 150 - 400 K/uL   Neutrophils Relative % 60 43 - 77 %   Neutro Abs 8.8 (H) 1.7 - 7.7 K/uL   Lymphocytes Relative 30 12 - 46 %   Lymphs Abs 4.4 (H) 0.7 - 4.0 K/uL   Monocytes Relative 7 3 - 12 %   Monocytes Absolute 1.1 (H) 0.1 - 1.0 K/uL   Eosinophils Relative 3 0 - 5 %   Eosinophils Absolute 0.4 0.0 - 0.7 K/uL   Basophils Relative 0 0 - 1 %   Basophils Absolute 0.0 0.0 - 0.1 K/uL  Comprehensive metabolic panel  Result Value Ref Range   Sodium 140 135 - 145 mmol/L   Potassium 4.6 3.5 - 5.1 mmol/L   Chloride 108 101 - 111 mmol/L   CO2 27 22 - 32 mmol/L   Glucose, Bld 95 70 - 99 mg/dL   BUN 18 6 - 20 mg/dL   Creatinine, Ser 1.21 0.61 - 1.24 mg/dL   Calcium 9.2 8.9 - 10.3 mg/dL   Total Protein 7.3 6.5 - 8.1 g/dL   Albumin 3.9 3.5 - 5.0 g/dL   AST 17 15 - 41 U/L   ALT 14 (L) 17 - 63 U/L   Alkaline Phosphatase 66 38 - 126 U/L   Total Bilirubin 0.6 0.3 - 1.2 mg/dL   GFR calc non Af Amer >60 >60 mL/min   GFR calc Af Amer >60 >60 mL/min   Anion gap 5 5 - 15  Urinalysis, Routine w reflex microscopic  Result Value Ref Range   Color, Urine YELLOW YELLOW   APPearance CLEAR CLEAR   Specific Gravity, Urine 1.008 1.005 -  1.030   pH 7.0 5.0 - 8.0   Glucose, UA NEGATIVE NEGATIVE mg/dL   Hgb urine dipstick NEGATIVE NEGATIVE   Bilirubin Urine NEGATIVE NEGATIVE   Ketones, ur NEGATIVE NEGATIVE mg/dL   Protein, ur NEGATIVE NEGATIVE mg/dL   Urobilinogen, UA 1.0 0.0 - 1.0 mg/dL   Nitrite NEGATIVE NEGATIVE   Leukocytes, UA NEGATIVE NEGATIVE  Lipase, blood  Result Value Ref Range   Lipase 36 22 - 51 U/L  Protime-INR  Result Value Ref Range   Prothrombin Time 12.7 11.6 - 15.2 seconds   INR 0.94 0.00 - 1.49  POC occult blood, ED Provider will collect  Result Value Ref Range   Fecal Occult Bld NEGATIVE NEGATIVE   Ct Abdomen Pelvis W Contrast  10/30/2014   CLINICAL DATA:  Right abdominal pain and rectal bleeding. Leukocytosis.  EXAM: CT ABDOMEN AND PELVIS WITH CONTRAST  TECHNIQUE: Multidetector CT imaging of the abdomen and pelvis  was performed using the standard protocol following bolus administration of intravenous contrast.  CONTRAST:  119mL OMNIPAQUE IOHEXOL 300 MG/ML SOLN, 3mL OMNIPAQUE IOHEXOL 300 MG/ML SOLN  COMPARISON:  03/28/2013  FINDINGS: Lower chest:  Mild atelectatic appearing posterior base opacities  Hepatobiliary: There are normal appearances of the liver, gallbladder and bile ducts except for an unchanged 12 mm left lobe hypodensity which probably is a benign cyst.  Pancreas: Normal  Spleen: Normal  Adrenals/Urinary Tract: The adrenals and kidneys are normal in appearance. There is no urinary calculus evident. There is no hydronephrosis or ureteral dilatation. Collecting systems and ureters appear unremarkable.  Stomach/Bowel: The stomach, small bowel and colon appear unremarkable. There is prior appendectomy.  Vascular/Lymphatic: The abdominal aorta is normal in caliber. There is mild atherosclerotic calcification. There is no adenopathy in the abdomen or pelvis.  Other: No acute inflammatory changes are evident in the abdomen or pelvis. There is no ascites.  Musculoskeletal: Severe L4-5 degenerative disc  disease. No significant musculoskeletal lesions.  IMPRESSION: No acute findings are evident in the abdomen or pelvis.   Electronically Signed   By: Andreas Newport M.D.   On: 10/30/2014 03:55      Delora Fuel, MD 97/41/63 [id number]

## 2015-05-01 ENCOUNTER — Encounter (HOSPITAL_COMMUNITY): Payer: Self-pay

## 2015-05-01 ENCOUNTER — Emergency Department (HOSPITAL_COMMUNITY): Payer: Self-pay

## 2015-05-01 ENCOUNTER — Emergency Department (HOSPITAL_COMMUNITY)
Admission: EM | Admit: 2015-05-01 | Discharge: 2015-05-01 | Disposition: A | Discharge status: Home / Self Care | Payer: Self-pay | Attending: Emergency Medicine | Admitting: Emergency Medicine

## 2015-05-01 DIAGNOSIS — M5430 Sciatica, unspecified side: Secondary | ICD-10-CM | POA: Insufficient documentation

## 2015-05-01 DIAGNOSIS — S3992XA Unspecified injury of lower back, initial encounter: Secondary | ICD-10-CM | POA: Insufficient documentation

## 2015-05-01 DIAGNOSIS — Y9389 Activity, other specified: Secondary | ICD-10-CM | POA: Insufficient documentation

## 2015-05-01 DIAGNOSIS — Z720 Tobacco use: Secondary | ICD-10-CM | POA: Insufficient documentation

## 2015-05-01 DIAGNOSIS — Y9289 Other specified places as the place of occurrence of the external cause: Secondary | ICD-10-CM | POA: Insufficient documentation

## 2015-05-01 DIAGNOSIS — S1080XA Unspecified superficial injury of other specified part of neck, initial encounter: Secondary | ICD-10-CM | POA: Insufficient documentation

## 2015-05-01 DIAGNOSIS — Y998 Other external cause status: Secondary | ICD-10-CM | POA: Insufficient documentation

## 2015-05-01 DIAGNOSIS — S2232XA Fracture of one rib, left side, initial encounter for closed fracture: Secondary | ICD-10-CM

## 2015-05-01 DIAGNOSIS — Z8619 Personal history of other infectious and parasitic diseases: Secondary | ICD-10-CM | POA: Insufficient documentation

## 2015-05-01 MED ORDER — IBUPROFEN 400 MG PO TABS: 400.0000 mg | ORAL_TABLET | Freq: Four times a day (QID) | ORAL | Status: DC | PRN

## 2015-05-01 MED ORDER — CYCLOBENZAPRINE HCL 10 MG PO TABS: 10.0000 mg | ORAL_TABLET | Freq: Two times a day (BID) | ORAL | Status: DC | PRN

## 2015-05-01 MED ORDER — KETOROLAC TROMETHAMINE 30 MG/ML IJ SOLN
60.0000 mg | Freq: Once | INTRAMUSCULAR | Status: AC
  Administered 2015-05-01: 60 mg via INTRAMUSCULAR
  Filled 2015-05-01: qty 2

## 2015-05-01 NOTE — Discharge Instructions (Signed)
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
## 2015-09-14 IMAGING — CT CT ABD-PELV W/ CM
1 of 3 series · 14 of 32 positions shown, 19 images · IV contrast (OMNIPAQUE 300)
Comparison: MRI of the lumbar spine performed 12/01/2010

CLINICAL DATA: Abdominal pain, back pain and headache. Jaundice.

EXAM:
CT ABDOMEN AND PELVIS WITH CONTRAST
TECHNIQUE: Multidetector CT imaging of the abdomen and pelvis was performed
using the standard protocol following bolus administration of
intravenous contrast.
CONTRAST:  100mL OMNIPAQUE IOHEXOL 300 MG/ML  SOLN

[Series 2: abd/pel with · axial · 0.79mm/px · z∈[-471,-26]mm · 14 of 99 slices shown, 19 images]
[im 5/99  soft-tissue]
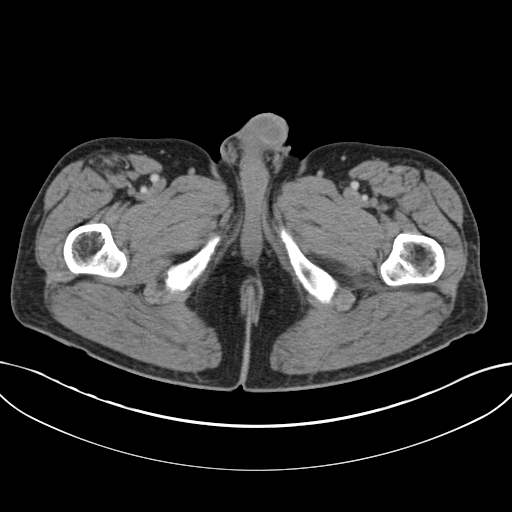
[im 5/99  bone]
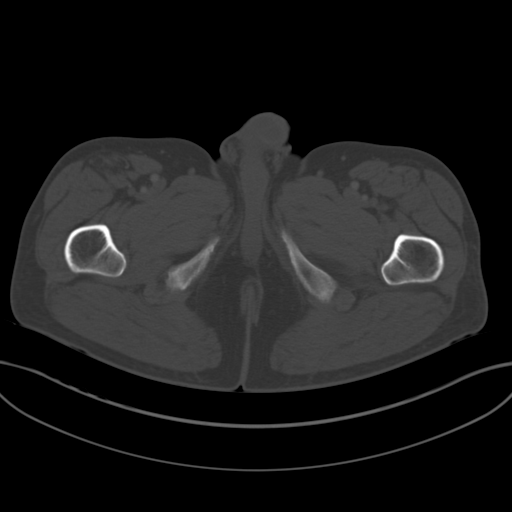
[im 15/99  soft-tissue]
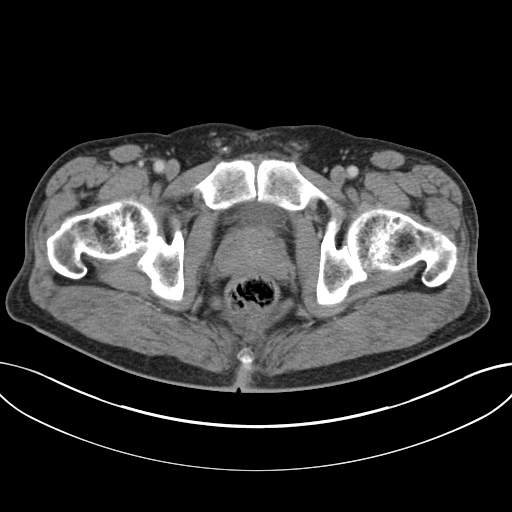
[im 19/99  soft-tissue]
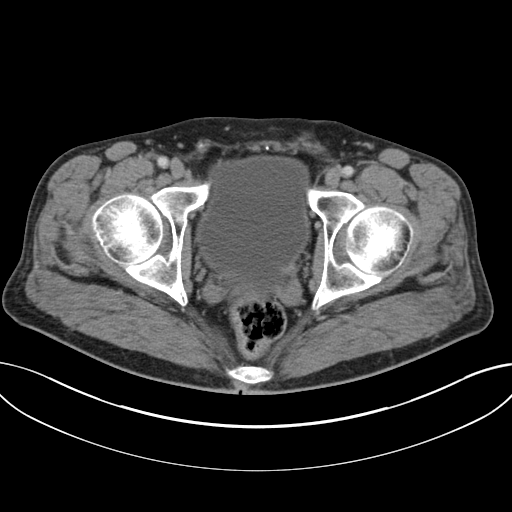
[im 29/99  soft-tissue]
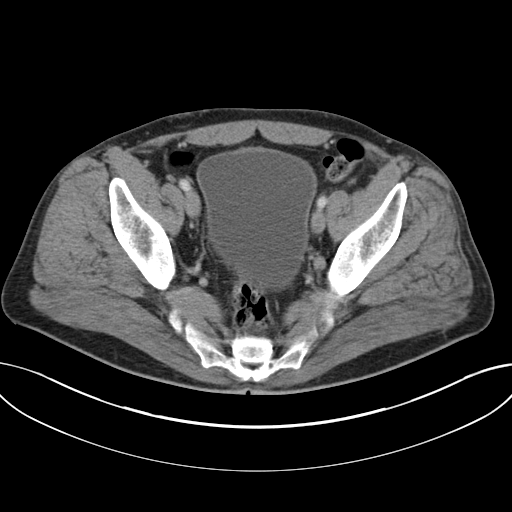
[im 33/99  soft-tissue]
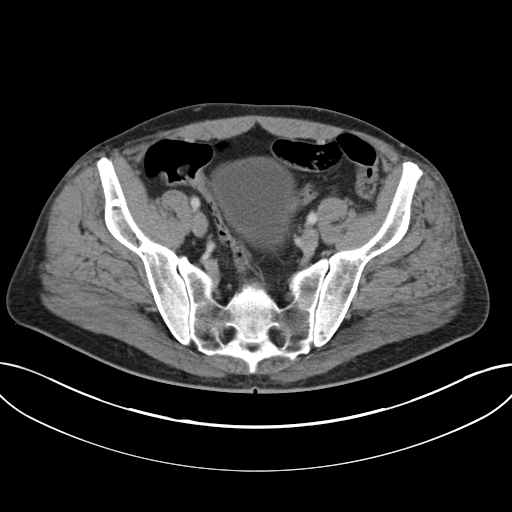
[im 43/99  soft-tissue]
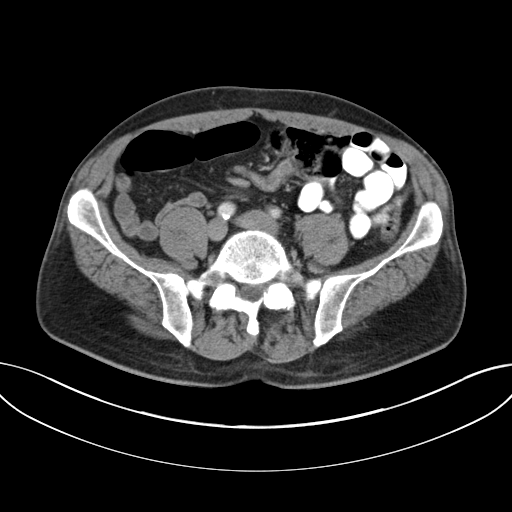
[im 52/99  soft-tissue]
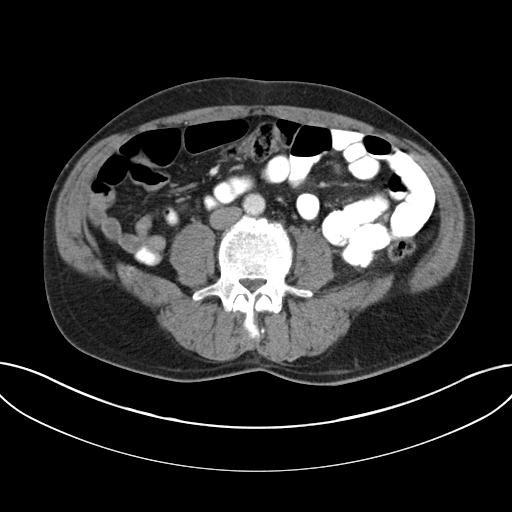
[im 57/99  soft-tissue]
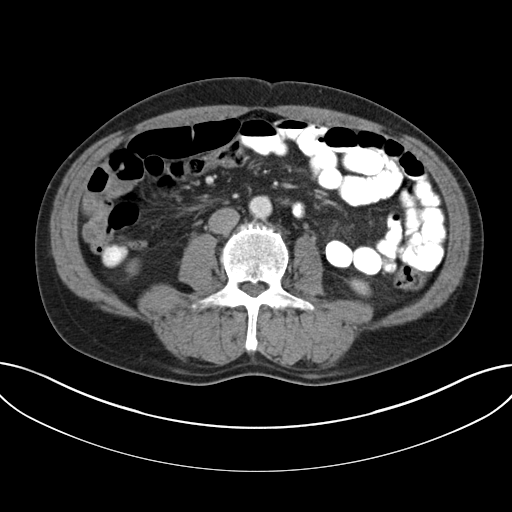
[im 66/99  soft-tissue]
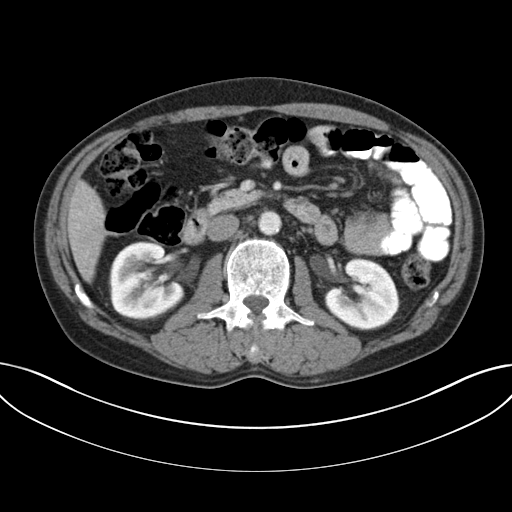
[im 66/99  bone]
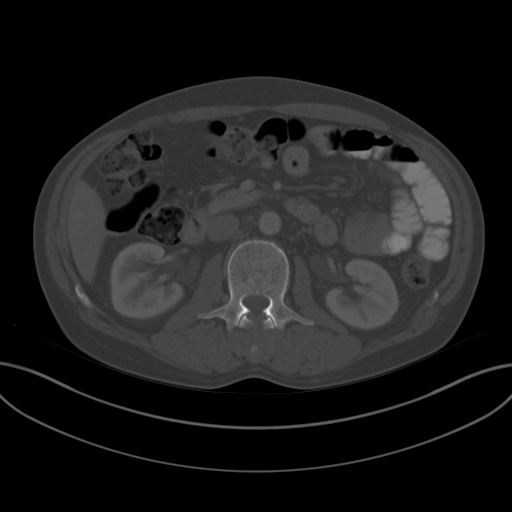
[im 71/99  soft-tissue]
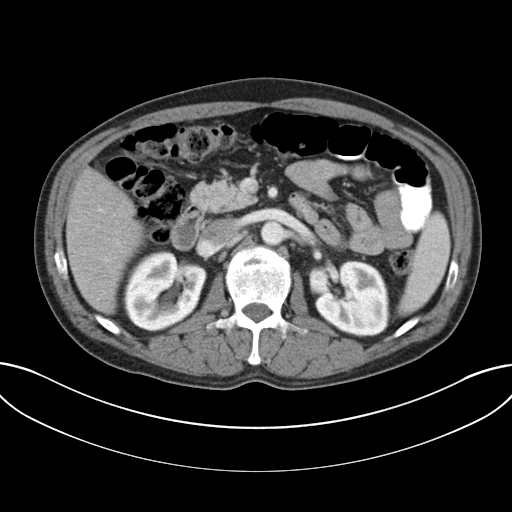
[im 80/99  soft-tissue]
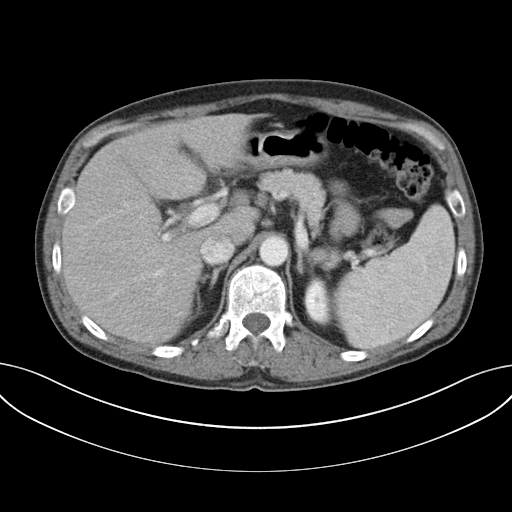
[im 80/99  lung]
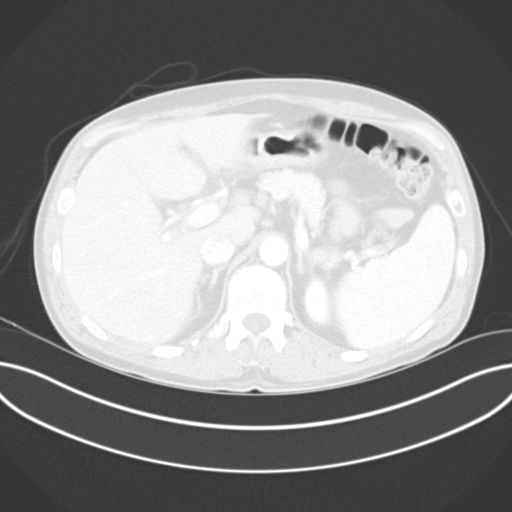
[im 85/99  soft-tissue]
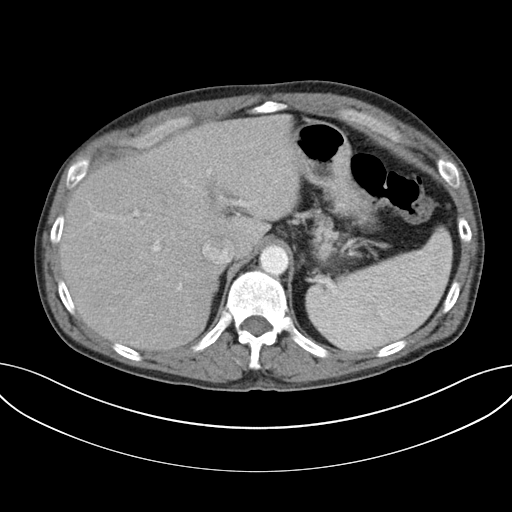
[im 85/99  lung]
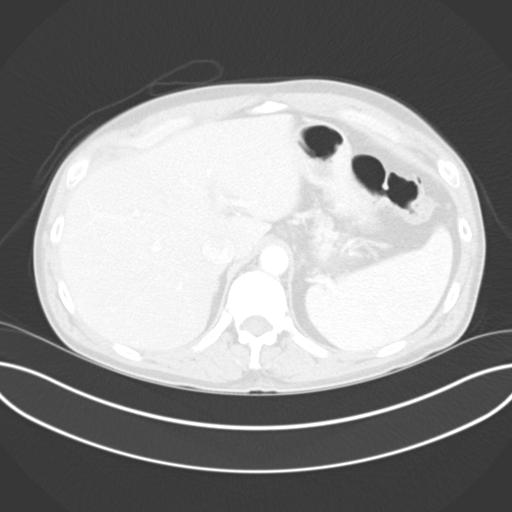
[im 89/99  lung]
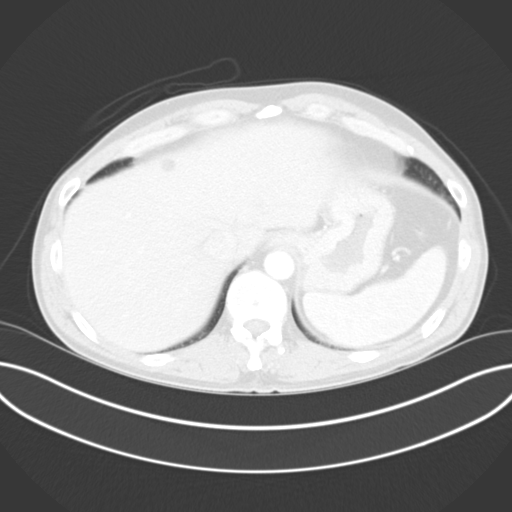
[im 94/99  soft-tissue]
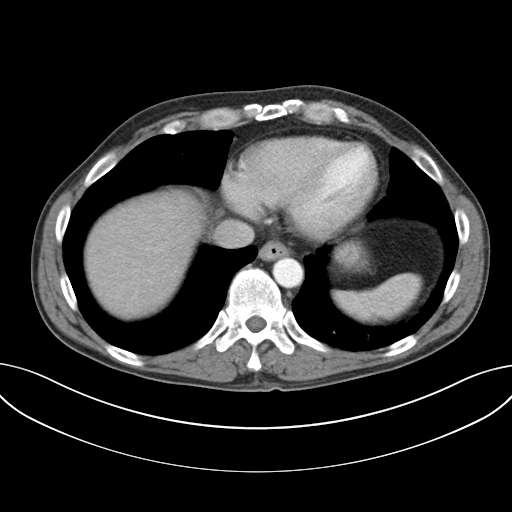
[im 94/99  lung]
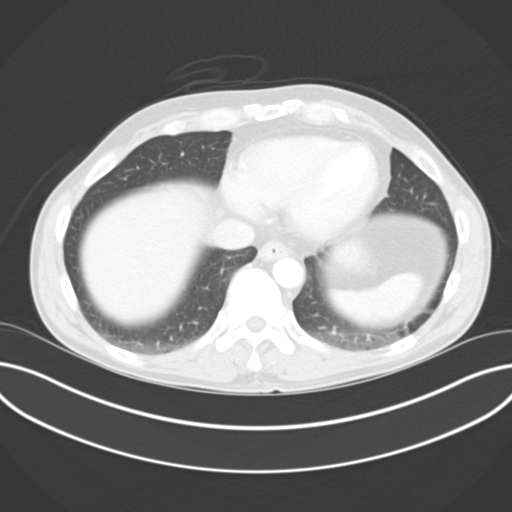

[14 of 32 positions shown; findings below may reference images not displayed]

FINDINGS: Minimal bibasilar atelectasis is noted.

A 1.1 cm hypodensity within the right hepatic lobe is nonspecific
but may reflect a small cyst. The liver is otherwise unremarkable in
appearance. The spleen is mildly enlarged, measuring 13.2 cm in
length.

There is mild gallbladder wall thickening, with trace
pericholecystic fluid, raising concern for mild cholecystitis. Trace
fluid is noted tracking along the porta hepatis. No definite stones
are characterized on CT, though evaluation for stones is limited on
CT.

The pancreas and adrenal glands are unremarkable in appearance with

The kidneys are unremarkable in appearance. There is no evidence of
hydronephrosis. No renal or ureteral stones are seen. No perinephric
stranding is appreciated.

No free fluid is identified. The small bowel is unremarkable in
appearance. The stomach is within normal limits. No acute vascular
abnormalities are seen. Mild calcification is noted along the common
iliac arteries and their branches.

The patient is status post appendectomy. The colon is unremarkable
in appearance.

The bladder is moderately distended and grossly unremarkable in
appearance. The prostate is mildly enlarged, measuring 5.3 cm in
transverse dimension. No inguinal lymphadenopathy is seen.

No acute osseous abnormalities are identified.
IMPRESSION: 1. Suspect mild acute cholecystitis, given mild gallbladder wall
thickening and trace pericholecystic fluid. Fluid tracks along the
porta hepatis.
2. Mild splenomegaly.
3. Likely small hepatic cyst.
4. Mildly enlarged prostate.

## 2017-04-29 ENCOUNTER — Emergency Department (HOSPITAL_COMMUNITY)
Admission: EM | Admit: 2017-04-29 | Discharge: 2017-04-30 | Disposition: A | Discharge status: Home / Self Care | Payer: Self-pay | Attending: Emergency Medicine | Admitting: Emergency Medicine

## 2017-04-29 ENCOUNTER — Other Ambulatory Visit: Payer: Self-pay

## 2017-04-29 ENCOUNTER — Encounter (HOSPITAL_COMMUNITY): Payer: Self-pay | Admitting: *Deleted

## 2017-04-29 ENCOUNTER — Emergency Department (HOSPITAL_COMMUNITY): Payer: Self-pay

## 2017-04-29 DIAGNOSIS — F17210 Nicotine dependence, cigarettes, uncomplicated: Secondary | ICD-10-CM | POA: Insufficient documentation

## 2017-04-29 DIAGNOSIS — M5441 Lumbago with sciatica, right side: Secondary | ICD-10-CM | POA: Insufficient documentation

## 2017-04-29 DIAGNOSIS — R1030 Lower abdominal pain, unspecified: Secondary | ICD-10-CM | POA: Insufficient documentation

## 2017-04-29 DIAGNOSIS — G8929 Other chronic pain: Secondary | ICD-10-CM | POA: Insufficient documentation

## 2017-04-29 DIAGNOSIS — G64 Other disorders of peripheral nervous system: Secondary | ICD-10-CM | POA: Insufficient documentation

## 2017-04-29 LAB — BASIC METABOLIC PANEL
ANION GAP: 8 (ref 5–15)
BUN: 14 mg/dL (ref 6–20)
CO2: 27 mmol/L (ref 22–32)
CREATININE: 1.15 mg/dL (ref 0.61–1.24)
Calcium: 9.1 mg/dL (ref 8.9–10.3)
Chloride: 102 mmol/L (ref 101–111)
GFR calc Af Amer: >60 mL/min (ref >60)
GFR calc non Af Amer: >60 mL/min (ref >60)
GLUCOSE: 131 mg/dL — AB (ref 65–99)
Potassium: 4.1 mmol/L (ref 3.5–5.1)
Sodium: 137 mmol/L (ref 135–145)

## 2017-04-29 LAB — CBC
HCT: 47 % (ref 39.0–52.0)
HEMOGLOBIN: 16.1 g/dL (ref 13.0–17.0)
MCH: 31.4 pg (ref 26.0–34.0)
MCHC: 34.3 g/dL (ref 30.0–36.0)
MCV: 91.8 fL (ref 78.0–100.0)
Platelets: 255 10*3/uL (ref 150–400)
RBC: 5.12 MIL/uL (ref 4.22–5.81)
RDW: 13.1 % (ref 11.5–15.5)
WBC: 15.4 10*3/uL — ABNORMAL HIGH (ref 4.0–10.5)

## 2017-04-29 LAB — URINALYSIS, ROUTINE W REFLEX MICROSCOPIC
BILIRUBIN URINE: NEGATIVE
GLUCOSE, UA: NEGATIVE mg/dL
Hgb urine dipstick: NEGATIVE
KETONES UR: NEGATIVE mg/dL
Leukocytes, UA: NEGATIVE
NITRITE: NEGATIVE
PH: 5 (ref 5.0–8.0)
Protein, ur: NEGATIVE mg/dL
SPECIFIC GRAVITY, URINE: 1.012 (ref 1.005–1.030)

## 2017-04-29 MED ORDER — OXYCODONE-ACETAMINOPHEN 5-325 MG PO TABS
2.0000 | ORAL_TABLET | Freq: Once | ORAL | Status: AC
  Administered 2017-04-29: 2 via ORAL
  Filled 2017-04-29: qty 2

## 2017-04-29 NOTE — ED Provider Notes (Signed)
[facility] EMERGENCY DEPARTMENT Provider Note   CSN: [id number] Arrival date & time: 04/29/17  1853     History   Chief Complaint Chief Complaint  Patient presents with  . Back Pain    HPI Brian Huynh is a 56 y.o. male.  HPI   Brian Huynh is a 56 year old male with a history of EtOH abuse, chronic back pain who presents emergency department for evaluation of lower back pain.  Patient states that he has had two laminectomy procedures of his lower back in the past with residual loss of sensation and weakness in the left foot.  Reports chronic lower back pain for several years now.  States that he has had worsening of his chronic lower back pain for the past 5 days.  States that it started when he was mowing the lawn last week.  No recent injury.  He now reports 10/10 severity, constant "sharp and shooting pain" which begins in the right lower back and travels to the flank area and anterior groin.  Pain is worsened with any type of movement, including sitting, twisting, ambulating.  He states that he has not taken any medication for his symptoms due to cost issues.  Denies history of kidney stone.  Denies associated nausea/vomiting.  Denies fever, saddle anesthesia, new numbness or weakness, loss of bowel or bladder control, abdominal pain, dysuria, hematuria.  Denies history of cancer or IV drug use.  He is able to ambulate independently despite pain.  Past Medical History:  Diagnosis Date  . Alcohol abuse   . Cholecystitis   . Chronic back pain   . Hepatitis C   . Jaundice   . Sciatica   . Transaminitis     Patient Active Problem List   Diagnosis Date Noted  . Restless legs syndrome 05/13/2013  . Special screening for malignant neoplasms, colon 05/13/2013  . Hepatitis C, acute 03/31/2013  . ETOH abuse 03/29/2013  . Cholecystitis, acute 03/29/2013  . Transaminitis 03/28/2013    Past Surgical History:  Procedure Laterality Date  . APPENDECTOMY    .  BACK SURGERY     x 2  . INGUINAL HERNIA REPAIR Right   . ORCHIOPEXY Right        Home Medications    Prior to Admission medications   Medication Sig Start Date End Date Taking? Authorizing Provider  cyclobenzaprine (FLEXERIL) 10 MG tablet Take 1 tablet (10 mg total) by mouth 2 (two) times daily as needed for muscle spasms. Patient not taking: Reported on 04/29/2017 05/01/15   Dowless, Aldona Bar Tripp, PA-C  ibuprofen (ADVIL,MOTRIN) 400 MG tablet Take 1 tablet (400 mg total) by mouth every 6 (six) hours as needed. Patient not taking: Reported on 04/29/2017 05/01/15   D[place]less, Aldona Bar Tripp, PA-C  oxyCODONE (ROXICODONE) 5 MG immediate release tablet Take 1 tablet (5 mg total) by mouth every 4 (four) hours as needed for severe pain. Patient not taking: Reported on 84/06/6604 3/0/16   Delora Fuel, MD  polyethylene glycol [facility] / Floria Raveling) packet Take 17 g by mouth daily. Patient not taking: Reported on 04/29/2017 08/17/13   Elnora Morrison, MD    Family History Family History  Problem Relation Age of Onset  . Stroke Brother        x 2 brothers  . AAA (abdominal aortic aneurysm) Father   . Diabetes Mother   . Lupus Mother     Social History Social History   Tobacco Use  . Smoking status: Current Some Day  Smoker    Packs/day: 1.00    Types: Cigarettes  . Smokeless tobacco: Never Used  Substance Use Topics  . Alcohol use: Yes  . Drug use: No     Allergies   Voltaren [diclofenac]   Review of Systems Review of Systems  Constitutional: Negative for chills, diaphoresis, fatigue, fever and unexpected weight change.  Gastrointestinal: Negative for abdominal pain, nausea and vomiting.  Genitourinary: Positive for flank pain. Negative for difficulty urinating, dysuria, frequency and hematuria.  Musculoskeletal: Positive for back pain. Negative for gait problem.  Neurological: Positive for numbness (chronic loss of sensation in left foot). Negative for weakness.     Physical  Exam Updated Vital Signs BP (!) 166/103 (BP Location: Left Arm)   Pulse 92   Temp 98.3 F (36.8 C) (Oral)   Resp 18   Ht 5\' 11"  (1.803 m)   Wt 79.4 kg (175 lb)   SpO2 100%   BMI 24.41 kg/m   Physical Exam  Constitutional: He is oriented to person, place, and time. He appears well-developed and well-nourished. No distress.  HENT:  Head: Normocephalic and atraumatic.  Eyes: Right eye exhibits no discharge. Left eye exhibits no discharge.  Neck: Normal range of motion. Neck supple.  No cervical spine tenderness.  Pulmonary/Chest: Effort normal. No respiratory distress.  Abdominal: Soft. Bowel sounds are normal. There is no tenderness. There is no guarding.  No CVA tenderness.  No pulsatile mass.  Musculoskeletal:  No midline T-spine or L-spine tenderness.  Tender over the right paraspinal muscles of the lumbar spine.  No tenderness over the right flank area or groin.  5/5 strength in right knee and ankle. 4/5 strength in left knee and ankle.  DP pulses 2+ bilaterally.  Neurological: He is alert and oriented to person, place, and time. Coordination normal.  Distal sensation intact to light touch in right lower extremity. No sensation to light touch in left toes, sensation intact in left ankle.  Patellar reflex 1+ bilaterally.  Gait normal in balance and coordination despite pain.  Skin: Skin is warm and dry. Capillary refill takes less than 2 seconds. He is not diaphoretic.  Psychiatric: He has a normal mood and affect. His behavior is normal.  Nursing note and vitals reviewed.   ED Treatments / Results  Labs (all labs ordered are listed, but only abnormal results are displayed) Labs Reviewed  URINALYSIS, [place] PANEL  CBC    EKG  EKG Interpretation None       Radiology No results found.  Procedures Procedures (including critical care time)  Medications Ordered in ED Medications  oxyCODONE-acetaminophen (PERCOCET/ROXICET)  5-325 MG per tablet 2 tablet (not administered)     Initial Impression / Assessment and Plan / ED Course  I have reviewed the triage vital signs and the nursing notes.  Pertinent labs & imaging results that were available during my care of the patient were reviewed by me and considered in my medical decision making (see chart for details).    Patient presents with right lower back and flank pain. He has reduced sensation and strength in the left foot, reports that this is chronic and denies recent change. No new neurological deficits on exam.  Patient can walk but states is painful. No loss of bowel or bladder control. Exam non-concerning for cauda equina.  No fever, night sweats, weight loss, h/o cancer, IVDU.   Labs and CT stone study performed given he has tenderness in the flank area  which comes and goes. CT reviewed, no stone identified or acute intraabdominal process. He does have a leukocytosis with WBC count of 15.4. He is afebrile, nontoxic appearing. Suspect that this is reactive from his pain, he has a history of leukocytosis on previous ER visits with pain. UA negative, BMP unremarkable.  Will treat patient symptomatically with RICE protocol and pain medicine indicated and discussed with patient.  His blood pressure was also elevated while in the ER today.  Counseled him to have this rechecked and he agrees and voiced understanding to the above plan. Discussed strict return precautions.  Discussed this patient with Dr. Sherry Ruffing who agrees with plan to discharge.    Final Clinical Impressions(s) / ED Diagnoses   Final diagnoses:  None    ED Discharge Orders    None       Bernarda Caffey 04/30/17 1219    Tegeler, Gwenyth Allegra, MD 05/02/17 1320

## 2017-04-29 NOTE — ED Triage Notes (Signed)
Pt reports chronic back pain. Having increase in lower back pain since mowing last week. Pain is right lower and radiates to leg.

## 2017-04-30 MED ORDER — LIDOCAINE 5 % EX PTCH: 1.0000 | MEDICATED_PATCH | CUTANEOUS | 0 refills | Status: DC

## 2017-04-30 MED ORDER — HYDROCODONE-ACETAMINOPHEN 5-325 MG PO TABS: 1.0000 | ORAL_TABLET | ORAL | 0 refills | Status: DC | PRN

## 2017-04-30 NOTE — Discharge Instructions (Signed)
The scan of your abdomen did not show a kidney stone.  Your urine was clear.  The pain in your lower back is likely muscle pain.  I have written you a medicine called Norco for pain.  Please take this as needed.  It will make you drowsy, please do not drive or drink alcohol while taking this medicine.  I have also written you a prescription for a lidocaine patch.  Please place this on the skin daily for your hurting.  Applying heat to the lower back and help improve symptoms.  Please use the [phone] number listed in this packet to establish care and schedule an appointment with a primary doctor to follow-up on your low back pain.  Your blood pressure was also elevated in the ER today.  Please have this rechecked.  Please return to the ER if you develop back pain in which you can no longer feel both of your legs, back pain with fever greater than 100.4 F, back pain with loss of bowel or bladder control.  Please also return for any new or worsening symptoms.

## 2018-01-25 DIAGNOSIS — I639 Cerebral infarction, unspecified: Secondary | ICD-10-CM

## 2018-01-25 HISTORY — DX: Cerebral infarction, unspecified: I63.9

## 2018-01-26 ENCOUNTER — Inpatient Hospital Stay (HOSPITAL_COMMUNITY): Payer: Self-pay

## 2018-01-26 ENCOUNTER — Inpatient Hospital Stay (HOSPITAL_COMMUNITY)
Admission: EM | Admit: 2018-01-26 | Discharge: 2018-02-03 | DRG: 065 | Disposition: A | Discharge status: Home / Self Care | Payer: Self-pay | Attending: Internal Medicine | Admitting: Internal Medicine

## 2018-01-26 ENCOUNTER — Encounter (HOSPITAL_COMMUNITY): Payer: Self-pay

## 2018-01-26 ENCOUNTER — Emergency Department (HOSPITAL_COMMUNITY): Payer: Self-pay

## 2018-01-26 ENCOUNTER — Other Ambulatory Visit: Payer: Self-pay

## 2018-01-26 DIAGNOSIS — F10230 Alcohol dependence with withdrawal, uncomplicated: Secondary | ICD-10-CM

## 2018-01-26 DIAGNOSIS — F329 Major depressive disorder, single episode, unspecified: Secondary | ICD-10-CM | POA: Diagnosis present

## 2018-01-26 DIAGNOSIS — F1910 Other psychoactive substance abuse, uncomplicated: Secondary | ICD-10-CM

## 2018-01-26 DIAGNOSIS — G459 Transient cerebral ischemic attack, unspecified: Secondary | ICD-10-CM

## 2018-01-26 DIAGNOSIS — G4700 Insomnia, unspecified: Secondary | ICD-10-CM | POA: Diagnosis present

## 2018-01-26 DIAGNOSIS — Z833 Family history of diabetes mellitus: Secondary | ICD-10-CM

## 2018-01-26 DIAGNOSIS — G2581 Restless legs syndrome: Secondary | ICD-10-CM | POA: Diagnosis present

## 2018-01-26 DIAGNOSIS — F17210 Nicotine dependence, cigarettes, uncomplicated: Secondary | ICD-10-CM | POA: Diagnosis present

## 2018-01-26 DIAGNOSIS — I63233 Cerebral infarction due to unspecified occlusion or stenosis of bilateral carotid arteries: Principal | ICD-10-CM | POA: Diagnosis present

## 2018-01-26 DIAGNOSIS — G8191 Hemiplegia, unspecified affecting right dominant side: Secondary | ICD-10-CM | POA: Diagnosis present

## 2018-01-26 DIAGNOSIS — Z832 Family history of diseases of the blood and blood-forming organs and certain disorders involving the immune mechanism: Secondary | ICD-10-CM

## 2018-01-26 DIAGNOSIS — F1990 Other psychoactive substance use, unspecified, uncomplicated: Secondary | ICD-10-CM

## 2018-01-26 DIAGNOSIS — D696 Thrombocytopenia, unspecified: Secondary | ICD-10-CM

## 2018-01-26 DIAGNOSIS — Z720 Tobacco use: Secondary | ICD-10-CM

## 2018-01-26 DIAGNOSIS — I639 Cerebral infarction, unspecified: Secondary | ICD-10-CM | POA: Diagnosis present

## 2018-01-26 DIAGNOSIS — R29703 NIHSS score 3: Secondary | ICD-10-CM | POA: Diagnosis present

## 2018-01-26 DIAGNOSIS — G8929 Other chronic pain: Secondary | ICD-10-CM | POA: Diagnosis present

## 2018-01-26 DIAGNOSIS — F1110 Opioid abuse, uncomplicated: Secondary | ICD-10-CM | POA: Diagnosis present

## 2018-01-26 DIAGNOSIS — Z823 Family history of stroke: Secondary | ICD-10-CM

## 2018-01-26 DIAGNOSIS — I10 Essential (primary) hypertension: Secondary | ICD-10-CM

## 2018-01-26 DIAGNOSIS — I6523 Occlusion and stenosis of bilateral carotid arteries: Secondary | ICD-10-CM

## 2018-01-26 DIAGNOSIS — F1010 Alcohol abuse, uncomplicated: Secondary | ICD-10-CM | POA: Diagnosis present

## 2018-01-26 DIAGNOSIS — M5440 Lumbago with sciatica, unspecified side: Secondary | ICD-10-CM | POA: Diagnosis present

## 2018-01-26 DIAGNOSIS — F419 Anxiety disorder, unspecified: Secondary | ICD-10-CM | POA: Diagnosis present

## 2018-01-26 DIAGNOSIS — Z888 Allergy status to other drugs, medicaments and biological substances status: Secondary | ICD-10-CM

## 2018-01-26 HISTORY — DX: Headache, unspecified: R51.9

## 2018-01-26 HISTORY — DX: Cerebral infarction, unspecified: I63.9

## 2018-01-26 HISTORY — DX: Essential (primary) hypertension: I10

## 2018-01-26 HISTORY — DX: Headache: R51

## 2018-01-26 LAB — DIFFERENTIAL
ABS IMMATURE GRANULOCYTES: 0 10*3/uL (ref 0.0–0.1)
BASOS PCT: 0 %
Basophils Absolute: 0 10*3/uL (ref 0.0–0.1)
EOS ABS: 0.2 10*3/uL (ref 0.0–0.7)
Eosinophils Relative: 2 %
Immature Granulocytes: 0 %
Lymphocytes Relative: 14 %
Lymphs Abs: 1.4 10*3/uL (ref 0.7–4.0)
Monocytes Absolute: 0.9 10*3/uL (ref 0.1–1.0)
Monocytes Relative: 8 %
NEUTROS PCT: 76 %
Neutro Abs: 7.8 10*3/uL — ABNORMAL HIGH (ref 1.7–7.7)

## 2018-01-26 LAB — COMPREHENSIVE METABOLIC PANEL
ALBUMIN: 3.3 g/dL — AB (ref 3.5–5.0)
ALT: 78 U/L — ABNORMAL HIGH (ref 0–44)
AST: 70 U/L — AB (ref 15–41)
Alkaline Phosphatase: 64 U/L (ref 38–126)
Anion gap: 11 (ref 5–15)
BUN: 9 mg/dL (ref 6–20)
CHLORIDE: 102 mmol/L (ref 98–111)
CO2: 25 mmol/L (ref 22–32)
Calcium: 8.8 mg/dL — ABNORMAL LOW (ref 8.9–10.3)
Creatinine, Ser: 1.16 mg/dL (ref 0.61–1.24)
GFR calc Af Amer: >60 mL/min (ref >60)
GLUCOSE: 101 mg/dL — AB (ref 70–99)
Potassium: 4.2 mmol/L (ref 3.5–5.1)
Sodium: 138 mmol/L (ref 135–145)
Total Bilirubin: 1.9 mg/dL — ABNORMAL HIGH (ref 0.3–1.2)
Total Protein: 7.3 g/dL (ref 6.5–8.1)

## 2018-01-26 LAB — URINALYSIS, ROUTINE W REFLEX MICROSCOPIC
Bilirubin Urine: NEGATIVE
GLUCOSE, UA: NEGATIVE mg/dL
KETONES UR: 5 mg/dL — AB
LEUKOCYTES UA: NEGATIVE
Nitrite: NEGATIVE
PROTEIN: NEGATIVE mg/dL
Specific Gravity, Urine: 1.011 (ref 1.005–1.030)
pH: 6 (ref 5.0–8.0)

## 2018-01-26 LAB — I-STAT CHEM 8, ED
BUN: 11 mg/dL (ref 6–20)
CALCIUM ION: 1.06 mmol/L — AB (ref 1.15–1.40)
CHLORIDE: 102 mmol/L (ref 98–111)
Creatinine, Ser: 1.1 mg/dL (ref 0.61–1.24)
Glucose, Bld: 100 mg/dL — ABNORMAL HIGH (ref 70–99)
HCT: 49 % (ref 39.0–52.0)
Hemoglobin: 16.7 g/dL (ref 13.0–17.0)
Potassium: 4 mmol/L (ref 3.5–5.1)
Sodium: 138 mmol/L (ref 135–145)
TCO2: 26 mmol/L (ref 22–32)

## 2018-01-26 LAB — ETHANOL: Alcohol, Ethyl (B): <10 mg/dL (ref <10)

## 2018-01-26 LAB — CBC
HCT: 48 % (ref 39.0–52.0)
Hemoglobin: 16.5 g/dL (ref 13.0–17.0)
MCH: 33.5 pg (ref 26.0–34.0)
MCHC: 34.4 g/dL (ref 30.0–36.0)
MCV: 97.6 fL (ref 78.0–100.0)
PLATELETS: 135 10*3/uL — AB (ref 150–400)
RBC: 4.92 MIL/uL (ref 4.22–5.81)
RDW: 13.2 % (ref 11.5–15.5)
WBC: 10.4 10*3/uL (ref 4.0–10.5)

## 2018-01-26 LAB — PROTIME-INR
INR: 1.11
Prothrombin Time: 14.2 seconds (ref 11.4–15.2)

## 2018-01-26 LAB — HEMOGLOBIN A1C
Hgb A1c MFr Bld: 4.6 % — ABNORMAL LOW (ref 4.8–5.6)
Mean Plasma Glucose: 85.32 mg/dL

## 2018-01-26 LAB — LIPID PANEL
CHOL/HDL RATIO: 5.9 ratio
Cholesterol: 147 mg/dL (ref 0–200)
HDL: 25 mg/dL — ABNORMAL LOW (ref >40)
LDL Cholesterol: 108 mg/dL — ABNORMAL HIGH (ref 0–99)
TRIGLYCERIDES: 72 mg/dL (ref <150)
VLDL: 14 mg/dL (ref 0–40)

## 2018-01-26 LAB — I-STAT TROPONIN, ED: TROPONIN I, POC: 0 ng/mL (ref 0.00–0.08)

## 2018-01-26 LAB — RAPID URINE DRUG SCREEN, HOSP PERFORMED
Amphetamines: NOT DETECTED
BARBITURATES: NOT DETECTED
Benzodiazepines: NOT DETECTED
Cocaine: NOT DETECTED
Opiates: NOT DETECTED
Tetrahydrocannabinol: NOT DETECTED

## 2018-01-26 LAB — APTT: aPTT: 30 seconds (ref 24–36)

## 2018-01-26 MED ORDER — ENOXAPARIN SODIUM 40 MG/0.4ML ~~LOC~~ SOLN
40.0000 mg | SUBCUTANEOUS | Status: DC
  Administered 2018-01-26 – 2018-02-02 (×8): 40 mg via SUBCUTANEOUS
  Filled 2018-01-26 (×8): qty 0.4

## 2018-01-26 MED ORDER — OXYCODONE HCL 5 MG PO TABS
5.0000 mg | ORAL_TABLET | ORAL | Status: DC | PRN
  Administered 2018-01-28 – 2018-02-02 (×6): 5 mg via ORAL
  Filled 2018-01-26 (×6): qty 1

## 2018-01-26 MED ORDER — FOLIC ACID 1 MG PO TABS
1.0000 mg | ORAL_TABLET | Freq: Every day | ORAL | Status: DC
  Administered 2018-01-26 – 2018-02-03 (×9): 1 mg via ORAL
  Filled 2018-01-26 (×9): qty 1

## 2018-01-26 MED ORDER — ATORVASTATIN CALCIUM 20 MG PO TABS
40.0000 mg | ORAL_TABLET | Freq: Every day | ORAL | Status: DC
  Filled 2018-01-26: qty 2

## 2018-01-26 MED ORDER — ATORVASTATIN CALCIUM 80 MG PO TABS
80.0000 mg | ORAL_TABLET | Freq: Every day | ORAL | Status: DC
  Administered 2018-01-26 – 2018-02-01 (×7): 80 mg via ORAL
  Filled 2018-01-26 (×7): qty 1

## 2018-01-26 MED ORDER — TRAZODONE HCL 50 MG PO TABS
100.0000 mg | ORAL_TABLET | Freq: Every day | ORAL | Status: DC
  Administered 2018-01-26 – 2018-01-28 (×3): 100 mg via ORAL
  Filled 2018-01-26 (×3): qty 2

## 2018-01-26 MED ORDER — VITAMIN B-1 100 MG PO TABS
100.0000 mg | ORAL_TABLET | Freq: Every day | ORAL | Status: DC
  Administered 2018-01-27 – 2018-02-03 (×8): 100 mg via ORAL
  Filled 2018-01-26 (×9): qty 1

## 2018-01-26 MED ORDER — OXYCODONE HCL 5 MG PO TABS
5.0000 mg | ORAL_TABLET | Freq: Once | ORAL | Status: AC
  Administered 2018-01-26: 5 mg via ORAL
  Filled 2018-01-26: qty 1

## 2018-01-26 MED ORDER — CITALOPRAM HYDROBROMIDE 10 MG PO TABS
40.0000 mg | ORAL_TABLET | Freq: Every day | ORAL | Status: DC
  Administered 2018-01-26 – 2018-02-03 (×9): 40 mg via ORAL
  Filled 2018-01-26 (×10): qty 4

## 2018-01-26 MED ORDER — THIAMINE HCL 100 MG/ML IJ SOLN
100.0000 mg | Freq: Every day | INTRAMUSCULAR | Status: DC
  Administered 2018-01-26: 100 mg via INTRAVENOUS
  Filled 2018-01-26 (×3): qty 2

## 2018-01-26 MED ORDER — LORAZEPAM 1 MG PO TABS
1.0000 mg | ORAL_TABLET | Freq: Four times a day (QID) | ORAL | Status: AC | PRN
  Administered 2018-01-28: 1 mg via ORAL
  Filled 2018-01-26: qty 1

## 2018-01-26 MED ORDER — NICOTINE 21 MG/24HR TD PT24
21.0000 mg | MEDICATED_PATCH | Freq: Every day | TRANSDERMAL | Status: DC
  Administered 2018-01-26 – 2018-02-03 (×9): 21 mg via TRANSDERMAL
  Filled 2018-01-26 (×9): qty 1

## 2018-01-26 MED ORDER — LORAZEPAM 2 MG/ML IJ SOLN
1.0000 mg | Freq: Four times a day (QID) | INTRAMUSCULAR | Status: AC | PRN
  Administered 2018-01-26 – 2018-01-27 (×2): 1 mg via INTRAVENOUS
  Filled 2018-01-26 (×2): qty 1

## 2018-01-26 MED ORDER — ADULT MULTIVITAMIN W/MINERALS CH
1.0000 | ORAL_TABLET | Freq: Every day | ORAL | Status: DC
  Administered 2018-01-26 – 2018-02-03 (×9): 1 via ORAL
  Filled 2018-01-26 (×9): qty 1

## 2018-01-26 MED ORDER — ASPIRIN 325 MG PO TABS
325.0000 mg | ORAL_TABLET | Freq: Every day | ORAL | Status: DC
  Administered 2018-01-26 – 2018-02-03 (×9): 325 mg via ORAL
  Filled 2018-01-26 (×9): qty 1

## 2018-01-26 MED ORDER — SODIUM CHLORIDE 0.9 % IV SOLN
INTRAVENOUS | Status: DC
  Administered 2018-01-26 – 2018-01-27 (×2): via INTRAVENOUS

## 2018-01-26 NOTE — ED Notes (Signed)
Patient transported to MRI 

## 2018-01-26 NOTE — Evaluation (Signed)
Physical Therapy Evaluation Patient Details Name: Brian Huynh MRN: [id number] DOB: [birth year] Today's Date: 01/26/2018   History of Present Illness  Brian Huynh is 57 y.o. 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 PMH includes HepC, Jaundice, substance abuse, and alcohol abuse.    Clinical Impression  Pt presents with problems above and deficits below. Pt performed bed mobility with Supervision. Pt performed sit<>stand transfers with MinG with/without RW. Pt ambulated with MinG-MinA without AD, but due to unsteadiness, PT allowed pt to use RW for safety. Pt was unsteady even with RW due to decreased strength and coordination of R side. Pt with two LOB during gait training, requiring MinA for steadying. Pt very motivated to regain independence, so feel he would be appropriate for CIR. Will continue to follow acutely to support independence, mobility, and safety.     Follow Up Recommendations CIR    Equipment Recommendations  None recommended by PT    Recommendations for Other Services OT consult     Precautions / Restrictions Precautions Precautions: Fall Restrictions Weight Bearing Restrictions: No      Mobility  Bed Mobility Overal bed mobility: Needs Assistance Bed Mobility: Supine to Sit     Supine to sit: Supervision     General bed mobility comments: Pt required increased time to sit up at EOB but required no physical assist.   Transfers Overall transfer level: Needs assistance Equipment used: None Transfers: Sit to/from Stand Sit to Stand: Min guard         General transfer comment: Pt was able to perform sit<>stand with no equipment and MinG for steadying. Pt had some difficulty and performed transfer with trunk flexed and was unsteady during transfer.   Ambulation/Gait Ambulation/Gait assistance: Min assist;Min guard Gait Distance (Feet): 40 Feet Assistive device: Rolling walker (2  wheeled);None Gait Pattern/deviations: Step-to pattern;Decreased step length - right;Decreased stride length;Decreased weight shift to right Gait velocity: Decreased   General Gait Details: Pt had some difficulty walking and required MinG-MinA. Pt ambulated without RW, but seemed unsteady without AD and had one LOB without RW. Pt ambulated 20 ft with RW, and had one visible LOB during turning with RW. Pt had difficulty gripping RW with R hand, and led with L leg during ambulation. PT noticed no buckling in R knee during ambulation. Pt reported some fatigue during ambulation, and PT returned pt to chair.   Stairs            Wheelchair Mobility    Modified Rankin (Stroke Patients Only)       Balance Overall balance assessment: Needs assistance Sitting-balance support: No upper extremity supported;Feet unsupported Sitting balance-Leahy Scale: Fair Sitting balance - Comments: Pt was able to scoot hips to EOB with LOB   Standing balance support: No upper extremity supported;Bilateral upper extremity supported Standing balance-Leahy Scale: Fair Standing balance comment: Pt was able to perform static standing without UE support.                              Pertinent Vitals/Pain Pain Assessment: Faces Faces Pain Scale: Hurts little more Pain Location: Back Pain Descriptors / Indicators: Grimacing;Guarding Pain Intervention(s): Monitored during session;Limited activity within patient's tolerance;Repositioned    Home Living Family/patient expects to be discharged to:: Unsure Living Arrangements: Non-relatives/Friends Available Help at Discharge: Available PRN/intermittently;Other (Comment)(Others living in the home, maybe staff) Type of Home: (Community center) Home Access: Ramped entrance;Level entry  Home Layout: Able to live on main level with bedroom/bathroom        Prior Function Level of Independence: Independent;Independent with assistive device(s)          Comments: Pt reports occasionally using a cane to get around.      Hand Dominance   Dominant Hand: Right    Extremity/Trunk Assessment   Upper Extremity Assessment Upper Extremity Assessment: Defer to OT evaluation;RUE deficits/detail RUE Deficits / Details: Pt had difficulty lifting R UE past ~100 degrees. Pt was able to perform fine motor and gross motor tasks, but with increased time and focusing, and lacked some coordination RUE Coordination: decreased fine motor;decreased gross motor    Lower Extremity Assessment Lower Extremity Assessment: RLE deficits/detail;LLE deficits/detail RLE Deficits / Details: Pt reports decreased sensation from hip to knee. Pt scored 3+ on R hip flexion MMT. Pt scored 3 on R knee extension MMT. Pt presented with some difficulty performing ankle-knee slides with R LE.  RLE Sensation: decreased light touch RLE Coordination: decreased fine motor;decreased gross motor LLE Deficits / Details: Pt scored a 4 on hip flexion MMT, and 3 on Knee Extension MMT on L LE. Pt wwas able to perform L LE ankle-knee slides without no visible lack of coordination.     Cervical / Trunk Assessment Cervical / Trunk Assessment: Normal  Communication   Communication: No difficulties  Cognition Arousal/Alertness: Awake/alert Behavior During Therapy: WFL for tasks assessed/performed Overall Cognitive Status: Within Functional Limits for tasks assessed                                        General Comments      Exercises     Assessment/Plan    PT Assessment Patient needs continued PT services  PT Problem List Decreased strength;Decreased range of motion;Decreased activity tolerance;Decreased balance;Decreased mobility;Decreased coordination;Decreased knowledge of use of DME;Pain;Impaired sensation       PT Treatment Interventions Gait training;DME instruction;Functional mobility training;Therapeutic activities;Therapeutic exercise;Balance  training;Neuromuscular re-education;Patient/family education    PT Goals (Current goals can be found in the Care Plan section)  Acute Rehab PT Goals Patient Stated Goal: to get stronger PT Goal Formulation: With patient Time For Goal Achievement: 02/09/18 Potential to Achieve Goals: Good    Frequency Min 4X/week   Barriers to discharge Decreased caregiver support      Co-evaluation               AM-PAC PT "6 Clicks" Daily Activity  Outcome Measure Difficulty turning over in bed (including adjusting bedclothes, sheets and blankets)?: A Little Difficulty moving from lying on back to sitting on the side of the bed? : A Little Difficulty sitting down on and standing up from a chair with arms (e.g., wheelchair, bedside commode, etc,.)?: Unable Help needed moving to and from a bed to chair (including a wheelchair)?: A Little Help needed walking in hospital room?: A Little Help needed climbing 3-5 steps with a railing? : A Lot 6 Click Score: 15    End of Session Equipment Utilized During Treatment: Gait belt Activity Tolerance: Patient limited by fatigue Patient left: in chair;with call bell/phone within reach;with chair alarm set Nurse Communication: Mobility status PT Visit Diagnosis: Unsteadiness on feet (R26.81);Other abnormalities of gait and mobility (R26.89);Muscle weakness (generalized) (M62.81);History of falling (Z91.81);Difficulty in walking, not elsewhere classified (R26.2);Other symptoms and signs involving the nervous system (R29.898);Pain;Hemiplegia and hemiparesis Hemiplegia - Right/Left: Right  Hemiplegia - dominant/non-dominant: Dominant Hemiplegia - caused by: Cerebral infarction Pain - part of body: (back)    Time: [id number] PT Time Calculation (min) (ACUTE ONLY): 22 min   Charges:   PT Evaluation $PT Eval Moderate Complexity: Regal, S-DPT [place] [facility] [phone]    01/26/2018, 6:37 PM

## 2018-01-26 NOTE — ED Notes (Signed)
Pt states that he used heroine last night; denies any other drug use

## 2018-01-26 NOTE — Progress Notes (Signed)
ED report received at 1416. Pt arrived to the unit via stretcher with belongings to the side. Pt alert and verbally responsive. Pt oriented to the unit and room; telemetry applied and verified with CCMD; NT called to second verify. Pt ambulated to the BR with 1 person assist; VSS; skin dry and intact with no opened wounds or pressure ulcer noted. Call light within reach and will continue to closely monitor. PDarden Palmer Jonnae Fonseca RN    01/26/18 1440  Vitals  Temp 98.1 F (36.7 C)  Temp Source Oral  BP 125/88  MAP (mmHg) 100  BP Location Left Arm  BP Method Automatic  Patient Position (if appropriate) Sitting  Pulse Rate 63  Pulse Rate Source Dinamap  Resp 18  Oxygen Therapy  SpO2 100 %  O2 Device Room Air

## 2018-01-26 NOTE — ED Provider Notes (Signed)
[facility] EMERGENCY DEPARTMENT Provider Note   CSN: [id number] Arrival date & time: 01/26/18  1029     History   Chief Complaint Chief Complaint  Patient presents with  . Weakness    HPI Brian Huynh is a 57 y.o. male.  The history is provided by the patient and medical records. No language interpreter was used.  Neurologic Problem  This is a new problem. The current episode started yesterday. The problem occurs constantly. The problem has not changed since onset.Pertinent negatives include no chest pain, no abdominal pain, no headaches and no shortness of breath. Nothing aggravates the symptoms. Nothing relieves the symptoms. He has tried nothing for the symptoms. The treatment provided no relief.    Past Medical History:  Diagnosis Date  . Alcohol abuse   . Cholecystitis   . Chronic back pain   . Hepatitis C   . Jaundice   . Sciatica   . Transaminitis     Patient Active Problem List   Diagnosis Date Noted  . Restless legs syndrome 05/13/2013  . Special screening for malignant neoplasms, colon 05/13/2013  . Hepatitis C, acute 03/31/2013  . ETOH abuse 03/29/2013  . Cholecystitis, acute 03/29/2013  . Transaminitis 03/28/2013    Past Surgical History:  Procedure Laterality Date  . APPENDECTOMY    . BACK SURGERY     x 2  . INGUINAL HERNIA REPAIR Right   . ORCHIOPEXY Right         Home Medications    Prior to Admission medications   Medication Sig Start Date End Date Taking? Authorizing Provider  cyclobenzaprine (FLEXERIL) 10 MG tablet Take 1 tablet (10 mg total) by mouth 2 (two) times daily as needed for muscle spasms. Patient not taking: Reported on 04/29/2017 05/01/15   Dowless, Dondra Spry, PA-C  HYDROcodone-acetaminophen (NORCO/VICODIN) 5-325 MG tablet Take 1 tablet every 4 (four) hours as needed by mouth. 04/30/17   Glyn Ade, PA-C  ibuprofen (ADVIL,MOTRIN) 400 MG tablet Take 1 tablet (400 mg total) by mouth every 6  (six) hours as needed. Patient not taking: Reported on 04/29/2017 05/01/15   Dowless, Aldona Bar Tripp, PA-C  lidocaine (LIDODERM) 5 % Place 1 patch daily onto the skin. Remove & Discard patch within 12 hours or as directed by MD 04/30/17   Glyn Ade, PA-C  oxyCODONE (ROXICODONE) 5 MG immediate release tablet Take 1 tablet (5 mg total) by mouth every 4 (four) hours as needed for severe pain. Patient not taking: Reported on 27/0/3500 02/24/80   Delora Fuel, MD  polyethylene glycol [facility] / Floria Raveling) packet Take 17 g by mouth daily. Patient not taking: Reported on 04/29/2017 08/17/13   Elnora Morrison, MD    Family History Family History  Problem Relation Age of Onset  . Stroke Brother        x 2 brothers  . AAA (abdominal aortic aneurysm) Father   . Diabetes Mother   . Lupus Mother     Social History Social History   Tobacco Use  . Smoking status: Current Some Day Smoker    Packs/day: 1.00    Types: Cigarettes  . Smokeless tobacco: Never Used  Substance Use Topics  . Alcohol use: Yes  . Drug use: No     Allergies   Voltaren [diclofenac]   Review of Systems Review of Systems  Constitutional: Negative for chills, diaphoresis, fatigue and fever.  HENT: Negative for congestion.   Eyes: Negative for visual disturbance.  Respiratory: Negative for  cough, chest tightness, shortness of breath, wheezing and stridor.   Cardiovascular: Negative for chest pain, palpitations and leg swelling.  Gastrointestinal: Negative for abdominal pain, constipation, diarrhea, nausea and vomiting.  Genitourinary: Negative for flank pain and frequency.  Musculoskeletal: Negative for back pain, neck pain and neck stiffness.  Skin: Negative for rash and wound.  Neurological: Positive for weakness and numbness. Negative for dizziness, seizures, facial asymmetry, speech difficulty, light-headedness and headaches.  Psychiatric/Behavioral: Negative for agitation.  All other systems reviewed and are  negative.    Physical Exam Updated Vital Signs BP (!) 141/94 (BP Location: Right Arm)   Pulse (!) 59   Temp 98.1 F (36.7 C) (Oral)   Resp 18   Ht 5\' 10"  (1.778 m)   Wt 94.3 kg (208 lb)   SpO2 99%   BMI 29.84 kg/m   Physical Exam  Constitutional: He is oriented to person, place, and time. He appears well-developed and well-nourished.  HENT:  Head: Normocephalic and atraumatic.  Mouth/Throat: Oropharynx is clear and moist. No oropharyngeal exudate.  Eyes: Pupils are equal, round, and reactive to light. Conjunctivae and EOM are normal.  Neck: Normal range of motion. Neck supple.  Cardiovascular: Normal rate and regular rhythm.  No murmur heard. Pulmonary/Chest: Effort normal and breath sounds normal. No respiratory distress. He has no wheezes. He has no rales. He exhibits no tenderness.  Abdominal: Soft. There is no tenderness.  Musculoskeletal: He exhibits no edema or tenderness.  Lymphadenopathy:    He has no cervical adenopathy.  Neurological: He is alert and oriented to person, place, and time. He is not disoriented. A sensory deficit is present. No cranial nerve deficit. He exhibits abnormal muscle tone. Coordination normal. GCS eye subscore is 4. GCS verbal subscore is 5. GCS motor subscore is 6.  Weakness in right arm and right leg.  Patient is left-handed.  Subtle numbness in the right arm compared to her left arm and the patient had numbness reported in the right leg compared to left.  Skin: Skin is warm and dry. Capillary refill takes less than 2 seconds. He is not diaphoretic. No erythema. No pallor.  Psychiatric: He has a normal mood and affect.  Nursing note and vitals reviewed.    ED Treatments / Results  Labs (all labs ordered are listed, but only abnormal results are displayed) Labs Reviewed  CBC - Abnormal; Notable for the following components:      Result Value   Platelets 135 (*)    All other components within normal limits  DIFFERENTIAL - Abnormal;  Notable for the following components:   Neutro Abs 7.8 (*)    All other components within normal limits  COMPREHENSIVE METABOLIC PANEL - Abnormal; Notable for the following components:   Glucose, Bld 101 (*)    Calcium 8.8 (*)    Albumin 3.3 (*)    AST 70 (*)    ALT 78 (*)    Total Bilirubin 1.9 (*)    All other components within normal limits  URINALYSIS, ROUTINE W REFLEX MICROSCOPIC - Abnormal; Notable for the following components:   Hgb urine dipstick SMALL (*)    Ketones, ur 5 (*)    Bacteria, UA RARE (*)    All other components within normal limits  HEMOGLOBIN A1C - Abnormal; Notable for the following components:   Hgb A1c MFr Bld 4.6 (*)    All other components within normal limits  LIPID PANEL - Abnormal; Notable for the following components:   HDL 25 (*)  LDL Cholesterol 108 (*)    All other components within normal limits  I-STAT CHEM 8, ED - Abnormal; Notable for the following components:   Glucose, Bld 100 (*)    Calcium, Ion 1.06 (*)    All other components within normal limits  PROTIME-INR  APTT  RAPID URINE DRUG SCREEN, HOSP PERFORMED  ETHANOL  I-STAT TROPONIN, ED    EKG EKG Interpretation  Date/Time:  Tuesday January 26 2018 10:43:23 EDT Ventricular Rate:  68 PR Interval:    QRS Duration: 92 QT Interval:  417 QTC Calculation: 444 R Axis:   51 Text Interpretation:  Sinus rhythm When compared to prior, no significant changes seen.  No STEMI Confirmed by Antony Blackbird [phone]) on 01/26/2018 10:56:07 AM   Radiology Ct Head Wo Contrast  Result Date: 01/26/2018 CLINICAL DATA:  CT head w/o contrast due to focal neuro deficit [facility] EMS; Last night around 2100 pt started experiencing weakness and numbness to R side; mechanical fall last night, hit head, no LOC, EXAM: CT HEAD WITHOUT CONTRAST TECHNIQUE: Contiguous axial images were obtained from the base of the skull through the vertex without intravenous contrast. COMPARISON:  None. FINDINGS:  Brain: No acute intracranial hemorrhage. No focal mass lesion. No CT evidence of acute infarction. No midline shift or mass effect. No hydrocephalus. Basilar cisterns are patent. Subcortical white matter hypodensities in the high frontal lobes. Vascular: No hyperdense vessel or unexpected calcification. Skull: Normal. Negative for fracture or focal lesion. Sinuses/Orbits: Paranasal sinuses and mastoid air cells are clear. Orbits are clear. Other: None. IMPRESSION: 1. No acute intracranial findings. 2. White matter microvascular disease Electronically Signed   By: Suzy Bouchard M.D.   On: 01/26/2018 11:48   Mr Brain Wo Contrast  Result Date: 01/26/2018 CLINICAL DATA:  Acute onset RIGHT extremity weakness beginning yesterday. EXAM: MRI HEAD WITHOUT CONTRAST TECHNIQUE: Axial and coronal diffusion weighted imaging. Patient did not wish to continue examination and study was terminated. COMPARISON:  CT HEAD January 26, 2018 FINDINGS: INTRACRANIAL CONTENTS: Patchy reduced diffusion bilateral frontoparietal lobes with low ADC values on the LEFT, predominantly normalized ADC values on the RIGHT. Reduced diffusion RIGHT occipital lobe with T2 shine through. No midline shift or mass effect. Borderline parenchymal brain volume loss. VASCULAR: Nondiagnostic. SKULL AND UPPER CERVICAL SPINE: No reduced diffusion within the calvarium to suggest hypercellular tumor. SINUSES/ORBITS: Nondiagnostic. OTHER: None. IMPRESSION: 1. Limited 2 sequence MRI head: Acute LEFT frontal infarcts. Subacute RIGHT frontal and biparietal infarcts. Constellation of findings concerning for vasculopathy or embolic phenomena, less likely PRES or venous infarcts. Given limited tolerance of MRI, consider repeat examination with sedation, MRA versus CTA head and neck. 2. Old RIGHT occipital lobe/PCA territory infarct. 3. Borderline parenchymal brain volume loss. Electronically Signed   By: Elon Alas M.D.   On: 01/26/2018 13:45     Procedures Procedures (including critical care time)  Medications Ordered in ED Medications  aspirin tablet 325 mg (has no administration in time range)  nicotine (NICODERM CQ - dosed in mg/24 hours) patch 21 mg (has no administration in time range)  LORazepam (ATIVAN) tablet 1 mg (has no administration in time range)    Or  LORazepam (ATIVAN) injection 1 mg (has no administration in time range)  thiamine (VITAMIN B-1) tablet 100 mg (has no administration in time range)    Or  thiamine (B-1) injection 100 mg (has no administration in time range)  folic acid (FOLVITE) tablet 1 mg (has no administration in time range)  multivitamin with minerals tablet 1 tablet (has no administration in time range)  citalopram (CELEXA) tablet 40 mg (has no administration in time range)  traZODone (DESYREL) tablet 100 mg (has no administration in time range)  enoxaparin (LOVENOX) injection 40 mg (has no administration in time range)  atorvastatin (LIPITOR) tablet 80 mg (has no administration in time range)  0.9 %  sodium chloride infusion (has no administration in time range)  oxyCODONE (Oxy IR/ROXICODONE) immediate release tablet 5 mg (5 mg Oral Given 01/26/18 1258)     Initial Impression / Assessment and Plan / ED Course  I have reviewed the triage vital signs and the nursing notes.  Pertinent labs & imaging results that were available during my care of the patient were reviewed by me and considered in my medical decision making (see chart for details).     Brian Huynh is a left handed 57 y.o. male with a past medical history significant for hepatitis C, IV drug abuse, chronic back pain  status post back surgeries, and restless leg syndrome who presents with right-sided numbness and weakness.  Patient reports that he was feeling normal until yesterday around 8 PM.  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  He reports it was having numbness and  weakness.  He reports that he began having leg symptoms when he went to bed and then this morning woke up still having symptoms.  He reports he tried to walk but had weakness and numbness in his right leg causing him to fall to the ground.  He did hit his head but denies headache.  He denies any left-sided symptoms, any speech of normality's, vision changes, or other problems.  He denies any history of stroke.  He does report that he has a history of heroin abuse and last used yesterday.  He reports he was clean for 1 month before relapsing yesterday.  He reports a long history of heroin use before then.  He denies any history of endocarditis or other drug related infections.  He denies any chest pain, palpitations or shortness of breath.  No recent cough, fevers, or chills.  He denies any dizziness or other complaints at this time.  On exam, patient had decreased strength in his right upper extremity compared to left.  He had decreased grip strength and could not raise his arm up.  He reported minimal numbness in the right arm compared to left.  He had a numbness sensation in certain parts of his right leg and also had 4-5 strength compared to 5 out of 5 strength in the right leg.  He was able to keep it raised but when pressed it dropped.  He also reports some numbness in his right torso compared to left.  He had normal extraocular movements, no facial droop, and clear speech.  Lungs were clear and I did not hear a murmur with auscultation of the heart.  Abdomen was nontender.  Exam otherwise unremarkable.  Clinically I am concerned he may have had a stroke.  Patient will have CT scan given his associated trauma after the symptoms.  We will likely speak with neurology for further management recommendations.  He is VAN negative on my exam.  EKG showed no STEMI and no arrhythmia.    CT scan showed some microvascular abnormalities.  No evidence of acute bleed or stroke seen.  Neurology was called who felt  he likely had a stroke.  They  recommended MRI and admission for further management.  With his lack of infectious symptoms and lack of murmur, I do not think he has endocarditis however if his MRI is found to show embolic strokes, patient may need further work-up of his heart valves given his IV drug use.  Patient admitted to medicine service for further management.   Final Clinical Impressions(s) / ED Diagnoses   Final diagnoses:  Cerebrovascular accident (CVA), unspecified mechanism ([place])    Clinical Impression: 1. Cerebrovascular accident (CVA), unspecified mechanism ([place])     Disposition: Admit  This note was prepared with assistance of Dragon voice recognition software. Occasional wrong-word or sound-a-like substitutions may have occurred due to the inherent limitations of voice recognition software.      Fabrizzio Marcella, Gwenyth Allegra, MD 01/26/18 [phone]

## 2018-01-26 NOTE — Consult Note (Signed)
Hospital Consult    Reason for Consult:  cva with carotid stenosis Referring Physician:  Neurology MRN #:  [id number]  History of Present Illness: This is a 57 y.o. male without previous vascular history although he does have left lower extremity pain and his been told that he has blockages.  Presented with right upper extremity weakness and right lower extremity numbness.  Found to have bilateral strokes on MRI.  Has never had a stroke or coronary issues in the past.  He does not take any blood thinners or antiplatelet medications.  He has been started on aspirin since admission.  Past Medical History:  Diagnosis Date  . Alcohol abuse   . Cholecystitis   . Chronic back pain   . Hepatitis C   . Jaundice   . Sciatica   . Transaminitis     Past Surgical History:  Procedure Laterality Date  . APPENDECTOMY    . BACK SURGERY     x 2  . INGUINAL HERNIA REPAIR Right   . ORCHIOPEXY Right     Allergies  Allergen Reactions  . Voltaren [Diclofenac] Rash    Voltaren gel caused red bumps on the skin    Prior to Admission medications   Medication Sig Start Date End Date Taking? Authorizing Provider  citalopram (CELEXA) 40 MG tablet Take 40 mg by mouth daily.   Yes [provider]  naproxen sodium (ALEVE) 220 MG tablet Take 440 mg by mouth daily as needed (pain).   Yes [provider]  traZODone (DESYREL) 100 MG tablet Take 100 mg by mouth at bedtime.   Yes [provider]  cyclobenzaprine (FLEXERIL) 10 MG tablet Take 1 tablet (10 mg total) by mouth 2 (two) times daily as needed for muscle spasms. Patient not taking: Reported on 04/29/2017 05/01/15   Dowless, Dondra Spry, PA-C  HYDROcodone-acetaminophen (NORCO/VICODIN) 5-325 MG tablet Take 1 tablet every 4 (four) hours as needed by mouth. Patient not taking: Reported on 01/26/2018 04/30/17   Glyn Ade, PA-C  ibuprofen (ADVIL,MOTRIN) 400 MG tablet Take 1 tablet (400 mg total) by mouth every 6 (six)  hours as needed. Patient not taking: Reported on 04/29/2017 05/01/15   Dowless, Aldona Bar Tripp, PA-C  lidocaine (LIDODERM) 5 % Place 1 patch daily onto the skin. Remove & Discard patch within 12 hours or as directed by MD Patient not taking: Reported on 01/26/2018 04/30/17   Glyn Ade, PA-C  oxyCODONE (ROXICODONE) 5 MG immediate release tablet Take 1 tablet (5 mg total) by mouth every 4 (four) hours as needed for severe pain. Patient not taking: Reported on 44/08/1538 0/8/67   Delora Fuel, MD  polyethylene glycol [facility] / Floria Raveling) packet Take 17 g by mouth daily. Patient not taking: Reported on 04/29/2017 08/17/13   Elnora Morrison, MD    Social History   Socioeconomic History  . Marital status: Married    Spouse name: Not on file  . Number of children: 2  . Years of education: Not on file  . Highest education level: Not on file  Occupational History  . Not on file  Social Needs  . Financial resource strain: Not on file  . Food insecurity:    Worry: Not on file    Inability: Not on file  . Transportation needs:    Medical: Not on file    Non-medical: Not on file  Tobacco Use  . Smoking status: Current Some Day Smoker    Packs/day: 1.00    Types: Cigarettes  .  Smokeless tobacco: Never Used  Substance and Sexual Activity  . Alcohol use: Yes  . Drug use: No  . Sexual activity: Not on file  Lifestyle  . Physical activity:    Days per week: Not on file    Minutes per session: Not on file  . Stress: Not on file  Relationships  . Social connections:    Talks on phone: Not on file    Gets together: Not on file    Attends religious service: Not on file    Active member of club or organization: Not on file    Attends meetings of clubs or organizations: Not on file    Relationship status: Not on file  . Intimate partner violence:    Fear of current or ex partner: Not on file    Emotionally abused: Not on file    Physically abused: Not on file    Forced sexual activity:  Not on file  Other Topics Concern  . Not on file  Social History Narrative  . Not on file     Family History  Problem Relation Age of Onset  . Stroke Brother        x 2 brothers  . AAA (abdominal aortic aneurysm) Father   . Diabetes Mother   . Lupus Mother     ROS: [x]  Positive   [ ]  Negative   [ ]  All sytems reviewed and are negative  Cardiovascular: []  chest pain/pressure []  palpitations []  SOB lying flat []  DOE []  pain in legs while walking []  pain in legs at rest []  pain in legs at night []  non-healing ulcers []  hx of DVT []  swelling in legs  Pulmonary: []  productive cough []  asthma/wheezing []  home O2  Neurologic: [x]  weakness in [x]  arms []  legs [x]  numbness in []  arms [x]  legs []  hx of CVA []  mini stroke [] difficulty speaking or slurred speech []  temporary loss of vision in one eye []  dizziness  Hematologic: []  hx of cancer []  bleeding problems []  problems with blood clotting easily  Endocrine:   []  diabetes []  thyroid disease  GI []  vomiting blood []  blood in stool  GU: []  CKD/renal failure []  HD--[]  M/W/F or []  T/T/S []  burning with urination []  blood in urine  Psychiatric: []  anxiety []  depression  Musculoskeletal: []  arthritis []  joint pain  Integumentary: []  rashes []  ulcers  Constitutional: []  fever []  chills   Physical Examination  Vitals:   01/26/18 1440 01/26/18 1620  BP: 125/88 (!) 141/94  Pulse: 63 (!) 59  Resp: 18 18  Temp: 98.1 F (36.7 C)   SpO2: 100% 99%   Body mass index is 29.84 kg/m.  General:  WDWN in NAD HENT: WNL, normocephalic Pulmonary: normal non-labored breathing, without Rales, rhonchi,  wheezing Cardiac: 2+ radial pulses bilaterally 2+ right femoral, popliteal and pt pulses 1+ left femoral pulse, no popliteal or pedal pulses Abdomen: soft, NT/ND, no masses Extremities: Right upper extremity is weak Musculoskeletal: no muscle wasting or atrophy    CBC    Component Value Date/Time    WBC 10.4 01/26/2018 1102   RBC 4.92 01/26/2018 1102   HGB 16.7 01/26/2018 1109   HCT 49.0 01/26/2018 1109   PLT 135 (L) 01/26/2018 1102   MCV 97.6 01/26/2018 1102   MCH 33.5 01/26/2018 1102   MCHC 34.4 01/26/2018 1102   RDW 13.2 01/26/2018 1102   LYMPHSABS 1.4 01/26/2018 1102   MONOABS 0.9 01/26/2018 1102   EOSABS 0.2 01/26/2018 1102  BASOSABS 0.0 01/26/2018 1102    BMET    Component Value Date/Time   NA 138 01/26/2018 1109   K 4.0 01/26/2018 1109   CL 102 01/26/2018 1109   CO2 25 01/26/2018 1102   GLUCOSE 100 (H) 01/26/2018 1109   BUN 11 01/26/2018 1109   CREATININE 1.10 01/26/2018 1109   CALCIUM 8.8 (L) 01/26/2018 1102   GFRNONAA >60 01/26/2018 1102   GFRAA >60 01/26/2018 1102    COAGS: Lab Results  Component Value Date   INR 1.11 01/26/2018   INR 0.94 10/29/2014   INR 1.1 (H) 05/13/2013     Non-Invasive Vascular Imaging:   Carotid duplex findings High-grade 80 to 99% stenosis bilaterally  IMPRESSION: 1. Limited 2 sequence MRI head: Acute LEFT frontal infarcts. Subacute RIGHT frontal and biparietal infarcts. Constellation of findings concerning for vasculopathy or embolic phenomena, less likely PRES or venous infarcts. Given limited tolerance of MRI, consider repeat examination with sedation, MRA versus CTA head and neck. 2. Old RIGHT occipital lobe/PCA territory infarct. 3. Borderline parenchymal brain volume loss.   ASSESSMENT/PLAN: This is a 57 y.o. male presented with right sided symptoms and found to have acute left frontal infarct as well as some subacute right-sided infarcts.  He has high-grade bilateral carotid stenosis and will need bilateral carotid endarterectomies.  Will discuss with neurology tomorrow plan to proceed likely with left side first timing to be to determined.    Brian Huynh C. Donzetta Matters, MD [facility] Office: [phone] Pager: [phone]

## 2018-01-26 NOTE — Progress Notes (Signed)
Critical report: B/L carotid doppler US: 82-99% stenosis bilaterally. Vascular surgery Dr Donzetta Matters consulted and will see the patient in consultation. Highly appreciated.

## 2018-01-26 NOTE — ED Notes (Signed)
Pt aware that a urine sample is needed; urinal at bedside 

## 2018-01-26 NOTE — Progress Notes (Signed)
Preliminary notes--Bilateral carotid duplex exam completed. Bilateral ICA 80-99% stenosis.  Bilateral vertebral arteries patent with antegrade flow.   Result called ordering physician Dr. Nevada Crane.  Hongying Aidon Klemens (RDMS RVT) Kelby Aline( MHA RDCS RVT) 01/26/18 5:11 PM

## 2018-01-26 NOTE — ED Notes (Signed)
Pt returned from MRI °

## 2018-01-26 NOTE — Progress Notes (Signed)
SLP Cancellation Note  Patient Details Name: DOHN STCLAIR MRN: [id number] DOB: 21-Sep-1960   Cancelled treatment:       Reason Eval/Treat Not Completed: Other (comment). Pt passed the RN stroke swallow screen. Per protocol, will initiate diet and defer SLP swallow eval order. Discussed with RN who will reorder SLP swallow eval if problems arise with pt PO intake.    Nazariah Cadet, Katherene Ponto 01/26/2018, 2:57 PM

## 2018-01-26 NOTE — H&P (Signed)
History and Physical  Brian Huynh AOZ:[id number] DOB: February 25, 1961 DOA: 01/26/2018  Referring physician: Dr Sherry Ruffing  PCP: Default, Provider, MD  Outpatient Specialists: None Patient coming from: Group Home  Chief Complaint: Right side weakness   HPI: Brian Huynh is a 57 y.o. male with medical history significant for chronic anxiety, chronic depression, IV drug use who presented to [facility] with complaints of right-sided weakness.  Onset yesterday late afternoon.  While patient was standing doing dishes noted mild to moderate weakness affecting his right arm and right leg.  Was hoping the weakness will go away.  Went to bed and when he woke up this morning his right-sided weakness was significantly worse to the point where he fell at which point 911 was called.  Denies similar episodes in the past.  Family history with CVA in both his brothers in their early 72s.  Denies any chest pain or palpitations.  Denies difficulty with speech or swallowing. Also denies personal or family history of heart disease.  ED Course: Upon presentation to the ED noted right upper arm weakness with inability to raise arms against gravity.  CT head with no contrast unremarkable for any acute intracranial findings.  MRI ordered and stroke neurology team consulted by ED physician.  Vital signs unremarkable. EKG in sinus rhythm.  No significant lab abnormalities.  Review of Systems: Review of systems as noted in the HPI. All other systems reviewed and are negative.   Past Medical History:  Diagnosis Date  . Alcohol abuse   . Cholecystitis   . Chronic back pain   . Hepatitis C   . Jaundice   . Sciatica   . Transaminitis    Past Surgical History:  Procedure Laterality Date  . APPENDECTOMY    . BACK SURGERY     x 2  . INGUINAL HERNIA REPAIR Right   . ORCHIOPEXY Right     Social History:  reports that he has been smoking cigarettes.  He has been smoking about 1.00 pack per day. He has never used smokeless  tobacco. He reports that he drinks alcohol. He reports that he does not use drugs. Admits to restarting using heroine yesterday after 2 years abstinence.   Allergies  Allergen Reactions  . Voltaren [Diclofenac] Rash    Voltaren gel caused red bumps on the skin    Family History  Problem Relation Age of Onset  . Stroke Brother        x 2 brothers  . AAA (abdominal aortic aneurysm) Father   . Diabetes Mother   . Lupus Mother      Prior to Admission medications   Medication Sig Start Date End Date Taking? Authorizing Provider  citalopram (CELEXA) 40 MG tablet Take 40 mg by mouth daily.   Yes [provider]  naproxen sodium (ALEVE) 220 MG tablet Take 440 mg by mouth daily as needed (pain).   Yes [provider]  traZODone (DESYREL) 100 MG tablet Take 100 mg by mouth at bedtime.   Yes [provider]  cyclobenzaprine (FLEXERIL) 10 MG tablet Take 1 tablet (10 mg total) by mouth 2 (two) times daily as needed for muscle spasms. Patient not taking: Reported on 04/29/2017 05/01/15   Dowless, Dondra Spry, PA-C  HYDROcodone-acetaminophen (NORCO/VICODIN) 5-325 MG tablet Take 1 tablet every 4 (four) hours as needed by mouth. Patient not taking: Reported on 01/26/2018 04/30/17   Glyn Ade, PA-C  ibuprofen (ADVIL,MOTRIN) 400 MG tablet Take 1 tablet (400 mg total) by  mouth every 6 (six) hours as needed. Patient not taking: Reported on 04/29/2017 05/01/15   Dowless, Aldona Bar Tripp, PA-C  lidocaine (LIDODERM) 5 % Place 1 patch daily onto the skin. Remove & Discard patch within 12 hours or as directed by MD Patient not taking: Reported on 01/26/2018 04/30/17   Glyn Ade, PA-C  oxyCODONE (ROXICODONE) 5 MG immediate release tablet Take 1 tablet (5 mg total) by mouth every 4 (four) hours as needed for severe pain. Patient not taking: Reported on 38/06/173 1/0/25   Delora Fuel, MD  polyethylene glycol [facility] / Floria Raveling) packet Take 17 g by mouth daily. Patient not  taking: Reported on 04/29/2017 08/17/13   Elnora Morrison, MD    Physical Exam: BP (!) 151/74   Pulse 68   Temp 98.5 F (36.9 C)   Resp 19   Ht 5\' 10"  (1.778 m)   Wt 94.3 kg (208 lb)   SpO2 96%   BMI 29.84 kg/m   . General: 57 y.o. year-old male well developed well nourished in no acute distress.  Alert and oriented x3.  No expressive aphasia. . Cardiovascular: Regular rate and rhythm with no rubs or gallops.  No thyromegaly or JVD noted.  No lower extremity edema. 2/4 pulses in all 4 extremities. Marland Kitchen Respiratory: Clear to auscultation with no wheezes or rales. Good inspiratory effort. . Abdomen: Soft nontender nondistended with normal bowel sounds x4 quadrants. . Muskuloskeletal: No cyanosis, clubbing or edema noted bilaterally.  5 out of 5 strength in left upper extremity and lower extremities bilaterally.  3 out of 5 strength in right upper extremity. . Neuro: CN II-XII intact, sensation, reflexes intact . Skin: No ulcerative lesions noted or rashes . Psychiatry: Judgement and insight appear normal. Mood is appropriate for condition and setting          Labs on Admission:  Basic Metabolic Panel: Recent Labs  Lab 01/26/18 1102 01/26/18 1109  NA 138 138  K 4.2 4.0  CL 102 102  CO2 25  --   GLUCOSE 101* 100*  BUN 9 11  CREATININE 1.16 1.10  CALCIUM 8.8*  --    Liver Function Tests: Recent Labs  Lab 01/26/18 1102  AST 70*  ALT 78*  ALKPHOS 64  BILITOT 1.9*  PROT 7.3  ALBUMIN 3.3*   No results for input(s): LIPASE, AMYLASE in the last 168 hours. No results for input(s): AMMONIA in the last 168 hours. CBC: Recent Labs  Lab 01/26/18 1102 01/26/18 1109  WBC 10.4  --   NEUTROABS 7.8*  --   HGB 16.5 16.7  HCT 48.0 49.0  MCV 97.6  --   PLT 135*  --    Cardiac Enzymes: No results for input(s): CKTOTAL, CKMB, CKMBINDEX, TROPONINI in the last 168 hours.  BNP (last 3 results) No results for input(s): BNP in the last 8760 hours.  ProBNP (last 3 results) No  results for input(s): PROBNP in the last 8760 hours.  CBG: No results for input(s): GLUCAP in the last 168 hours.  Radiological Exams on Admission: Ct Head Wo Contrast  Result Date: 01/26/2018 CLINICAL DATA:  CT head w/o contrast due to focal neuro deficit Pt from [place] via EMS; Last night around 2100 pt started experiencing weakness and numbness to R side; mechanical fall last night, hit head, no LOC, EXAM: CT HEAD WITHOUT CONTRAST TECHNIQUE: Contiguous axial images were obtained from the base of the skull through the vertex without intravenous contrast. COMPARISON:  None. FINDINGS: Brain: No  acute intracranial hemorrhage. No focal mass lesion. No CT evidence of acute infarction. No midline shift or mass effect. No hydrocephalus. Basilar cisterns are patent. Subcortical white matter hypodensities in the high frontal lobes. Vascular: No hyperdense vessel or unexpected calcification. Skull: Normal. Negative for fracture or focal lesion. Sinuses/Orbits: Paranasal sinuses and mastoid air cells are clear. Orbits are clear. Other: None. IMPRESSION: 1. No acute intracranial findings. 2. White matter microvascular disease Electronically Signed   By: Suzy Bouchard M.D.   On: 01/26/2018 11:48    EKG: I independently viewed the EKG done and my findings are as followed: Sinus rhythm with rate of 63 with no specific ST-T changes.  Assessment/Plan Present on Admission: . 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   Risks: Moderate to severe risk for decompensation due to present condition, history of IV drug use, and age.  DVT prophylaxis: Subcu Lovenox daily  Code Status: Full code  Family Communication: None at bedside  Disposition Plan: Admit to telemetry unit  Consults called: Stroke team neurology  Admission status: Inpatient  Patient will require at least 2 midnights for further testings and treatment of present condition.    Kayleen Memos MD [facility] Pager [phone]  If 7PM-7AM, please contact night-coverage [email or web address] Password [email or web address]  01/26/2018, 12:55 PM

## 2018-01-26 NOTE — Consult Note (Addendum)
Neurology Consultation  Reason for Consult: Stroke Referring Physician: [place] Crane   History is obtained from: Patient  HPI: Brian Huynh is a 57 y.o. male severe alcohol abuse and substance abuse.  Patient states that yesterday around 5 PM he noted that he felt funny but cannot really describe how he fell.  He then noticed that his right arm and leg felt very weak.  He thought that this would go away which is why he did not come to the hospital today.  Later in the conversation he does admit that he did take heroin that day.  It was not clear exactly if he took the heroin before after.  Initially stated that he took it after he was feeling some arm weakness but then he stated that he took the heroin prior to arm weakness but when he felt kind of weird in the head.  Thus it is unclear the timing of the heroin administration or intake.  Patient admits to smoking 1 pack/day and does not take aspirin daily.  He admits to drinking but was very guarded about how much he drinks.  LKW: 01/25/2018 tpa given?: no, out of window Premorbid modified Rankin scale (mRS): 0 NIH stroke scale 3   ROS: A 14 point ROS was performed and is negative except as noted in the HPI.   Past Medical History:  Diagnosis Date  . Alcohol abuse   . Cholecystitis   . Chronic back pain   . Hepatitis C   . Jaundice   . Sciatica   . Transaminitis      Family History  Problem Relation Age of Onset  . Stroke Brother        x 2 brothers  . AAA (abdominal aortic aneurysm) Father   . Diabetes Mother   . Lupus Mother      Social History:   reports that he has been smoking cigarettes.  He has been smoking about 1.00 pack per day. He has never used smokeless tobacco. He reports that he drinks alcohol. He reports that he does not use drugs.  Medications  Current Facility-Administered Medications:  .  aspirin tablet 325 mg, 325 mg, Oral, Daily, Hall, Carole N, DO .  atorvastatin (LIPITOR) tablet 40 mg, 40 mg, Oral,  q1800, Hall, Carole N, DO .  citalopram (CELEXA) tablet 40 mg, 40 mg, Oral, Daily, Hall, Carole N, DO .  enoxaparin (LOVENOX) injection 40 mg, 40 mg, Subcutaneous, Q24H, Hall, Carole N, DO .  folic acid (FOLVITE) tablet 1 mg, 1 mg, Oral, Daily, Hall, Carole N, DO .  LORazepam (ATIVAN) tablet 1 mg, 1 mg, Oral, Q6H PRN **OR** LORazepam (ATIVAN) injection 1 mg, 1 mg, Intravenous, Q6H PRN, Hall, Carole N, DO .  multivitamin with minerals tablet 1 tablet, 1 tablet, Oral, Daily, Hall, Carole N, DO .  nicotine (NICODERM CQ - dosed in mg/24 hours) patch 21 mg, 21 mg, Transdermal, Daily, Hall, Carole N, DO .  thiamine (VITAMIN B-1) tablet 100 mg, 100 mg, Oral, Daily **OR** thiamine (B-1) injection 100 mg, 100 mg, Intravenous, Daily, Hall, Carole N, DO .  traZODone (DESYREL) tablet 100 mg, 100 mg, Oral, QHS, Hall, Carole N, DO  Current Outpatient Medications:  .  citalopram (CELEXA) 40 MG tablet, Take 40 mg by mouth daily., Disp: , Rfl:  .  naproxen sodium (ALEVE) 220 MG tablet, Take 440 mg by mouth daily as needed (pain)., Disp: , Rfl:  .  traZODone (DESYREL) 100 MG tablet, Take 100 mg by mouth  at bedtime., Disp: , Rfl:  .  cyclobenzaprine (FLEXERIL) 10 MG tablet, Take 1 tablet (10 mg total) by mouth 2 (two) times daily as needed for muscle spasms. (Patient not taking: Reported on 04/29/2017), Disp: 20 tablet, Rfl: 0 .  HYDROcodone-acetaminophen (NORCO/VICODIN) 5-325 MG tablet, Take 1 tablet every 4 (four) hours as needed by mouth. (Patient not taking: Reported on 01/26/2018), Disp: 6 tablet, Rfl: 0 .  ibuprofen (ADVIL,MOTRIN) 400 MG tablet, Take 1 tablet (400 mg total) by mouth every 6 (six) hours as needed. (Patient not taking: Reported on 04/29/2017), Disp: 30 tablet, Rfl: 0 .  lidocaine (LIDODERM) 5 %, Place 1 patch daily onto the skin. Remove & Discard patch within 12 hours or as directed by MD (Patient not taking: Reported on 01/26/2018), Disp: 30 patch, Rfl: 0 .  oxyCODONE (ROXICODONE) 5 MG immediate  release tablet, Take 1 tablet (5 mg total) by mouth every 4 (four) hours as needed for severe pain. (Patient not taking: Reported on 04/29/2017), Disp: 15 tablet, Rfl: 0 .  polyethylene glycol (MIRALAX / GLYCOLAX) packet, Take 17 g by mouth daily. (Patient not taking: Reported on 04/29/2017), Disp: 14 each, Rfl: 0   Exam: Current vital signs: BP 139/85   Pulse 71   Temp 98.5 F (36.9 C)   Resp 17   Ht 5\' 10"  (1.778 m)   Wt 94.3 kg (208 lb)   SpO2 97%   BMI 29.84 kg/m  Vital signs in last 24 hours: Temp:  [98.5 F (36.9 C)-98.6 F (37 C)] 98.5 F (36.9 C) (08/06 1226) Pulse Rate:  [58-71] 71 (08/06 1245) Resp:  [14-19] 17 (08/06 1245) BP: (129-151)/(71-93) 139/85 (08/06 1245) SpO2:  [92 %-97 %] 97 % (08/06 1245) Weight:  [94.3 kg (208 lb)] 94.3 kg (208 lb) (08/06 1053)  GENERAL: Awake, alert in NAD HEENT: - Normocephalic and atraumatic, dry mm, Ext: warm, well perfused, intact peripheral pulses, __ edema  NEURO:  Mental Status: AA&Ox3, speech is clear.  Naming, repetition, fluency, and comprehension intact. Cranial Nerves: PERRL to  mm/brisk. EOMI, visual fields full, a dentulous but I see a possible right facial droop, facial sensation intact, hearing intact, tongue/uvula/soft palate midline, normal sternocleidomastoid and trapezius muscle strength. Motor: Right shoulder abduction is a 3/5 with bicep flexion/tricep and grip 5/5.  Left arm is 5/5.  Right leg has 5/5 along with left leg however left leg did show a minor drift Tone: is normal and bulk is normal Sensation- Intact to light touch bilaterally Coordination: FTN intact bilaterally, no ataxia in BLE. Gait- deferred  Labs I have reviewed labs in epic and the results pertinent to this consultation are:   CBC    Component Value Date/Time   WBC 10.4 01/26/2018 1102   RBC 4.92 01/26/2018 1102   HGB 16.7 01/26/2018 1109   HCT 49.0 01/26/2018 1109   PLT 135 (L) 01/26/2018 1102   MCV 97.6 01/26/2018 1102   MCH 33.5  01/26/2018 1102   MCHC 34.4 01/26/2018 1102   RDW 13.2 01/26/2018 1102   LYMPHSABS 1.4 01/26/2018 1102   MONOABS 0.9 01/26/2018 1102   EOSABS 0.2 01/26/2018 1102   BASOSABS 0.0 01/26/2018 1102    CMP     Component Value Date/Time   NA 138 01/26/2018 1109   K 4.0 01/26/2018 1109   CL 102 01/26/2018 1109   CO2 25 01/26/2018 1102   GLUCOSE 100 (H) 01/26/2018 1109   BUN 11 01/26/2018 1109   CREATININE 1.10 01/26/2018 1109   CALCIUM  8.8 (L) 01/26/2018 1102   PROT 7.3 01/26/2018 1102   ALBUMIN 3.3 (L) 01/26/2018 1102   AST 70 (H) 01/26/2018 1102   ALT 78 (H) 01/26/2018 1102   ALKPHOS 64 01/26/2018 1102   BILITOT 1.9 (H) 01/26/2018 1102   GFRNONAA >60 01/26/2018 1102   GFRAA >60 01/26/2018 1102    Lipid Panel  No results found for: CHOL, TRIG, HDL, CHOLHDL, VLDL, LDLCALC, LDLDIRECT   Imaging I have reviewed the images obtained:  CT-scan of the brain--- IMPRESSION: 1. No acute intracranial findings. 2. White matter microvascular disease   MRI examination of the brain-- IMPRESSION: 1. Limited 2 sequence MRI head: Acute LEFT frontal infarcts. Subacute RIGHT frontal and biparietal infarcts. Constellation of findings concerning for vasculopathy or embolic phenomena, less likely PRES or venous infarcts. Given limited tolerance of MRI, consider repeat examination with sedation, MRA versus CTA head and neck. 2. Old RIGHT occipital lobe/PCA territory infarct. 3. Borderline parenchymal brain volume loss.   Assessment: 57 year old male with bilateral left and right subacute infarcts in the frontal and biparietal area.  The symmetry I think would argue more for a watershed distribution, but embolic is also possible. Especially with history of drug use, he will need echo.    Recommend #CTA of head and neck with venous phase #Transthoracic Echo, may need TEE and loop monitor  # Start patient on ASA 325mg  daily,   #Start or continue Atorvastatin 80 mg/other high intensity  statin # BP goal: permissive HTN upto 220/120 mmHg # blood culture # HBAIC and Lipid profile # Telemetry monitoring # Frequent neuro checks # NPO until passes stroke swallow screen # please page stroke NP  Or  PA  Or MD from 8am -4 pm  as this patient from this time will be  followed by the stroke.   You can look them up on [email or web address]  Password [email or web address]   Roland Rack, MD [facility] [phone]  If 7pm- 7am, please page neurology on call as listed in [place].

## 2018-01-26 NOTE — ED Triage Notes (Addendum)
Pt from [facility] via EMS; Last night around 2100 pt started experiencing weakness and numbness to R side; mechanical fall last night, hit head, no LOC, no outward signs of injury; A&O today, still c/o r sided numbness and weakness; 2 on LVO score w/ EMS (weakness, R arm drift); no facial droop, clear speech  130/67 HR 75 RR 18 CBG 96 96% RA

## 2018-01-26 NOTE — ED Notes (Signed)
Admitting at bedside 

## 2018-01-26 NOTE — Progress Notes (Signed)
Pt transported off unit to vascular. Delia Heady RN

## 2018-01-27 ENCOUNTER — Inpatient Hospital Stay (HOSPITAL_COMMUNITY): Payer: No Typology Code available for payment source

## 2018-01-27 ENCOUNTER — Inpatient Hospital Stay (HOSPITAL_COMMUNITY): Payer: Self-pay

## 2018-01-27 DIAGNOSIS — I10 Essential (primary) hypertension: Secondary | ICD-10-CM

## 2018-01-27 DIAGNOSIS — Z789 Other specified health status: Secondary | ICD-10-CM

## 2018-01-27 DIAGNOSIS — I5030 Unspecified diastolic (congestive) heart failure: Secondary | ICD-10-CM

## 2018-01-27 DIAGNOSIS — Z720 Tobacco use: Secondary | ICD-10-CM

## 2018-01-27 DIAGNOSIS — I63233 Cerebral infarction due to unspecified occlusion or stenosis of bilateral carotid arteries: Principal | ICD-10-CM

## 2018-01-27 DIAGNOSIS — F1010 Alcohol abuse, uncomplicated: Secondary | ICD-10-CM

## 2018-01-27 DIAGNOSIS — I6523 Occlusion and stenosis of bilateral carotid arteries: Secondary | ICD-10-CM

## 2018-01-27 DIAGNOSIS — R740 Nonspecific elevation of levels of transaminase and lactic acid dehydrogenase [LDH]: Secondary | ICD-10-CM

## 2018-01-27 DIAGNOSIS — D696 Thrombocytopenia, unspecified: Secondary | ICD-10-CM

## 2018-01-27 DIAGNOSIS — R945 Abnormal results of liver function studies: Secondary | ICD-10-CM

## 2018-01-27 DIAGNOSIS — F1910 Other psychoactive substance abuse, uncomplicated: Secondary | ICD-10-CM

## 2018-01-27 LAB — COMPREHENSIVE METABOLIC PANEL
ALBUMIN: 2.9 g/dL — AB (ref 3.5–5.0)
ALT: 68 U/L — AB (ref 0–44)
AST: 57 U/L — ABNORMAL HIGH (ref 15–41)
Alkaline Phosphatase: 58 U/L (ref 38–126)
Anion gap: 10 (ref 5–15)
BUN: 12 mg/dL (ref 6–20)
CO2: 27 mmol/L (ref 22–32)
CREATININE: 1.08 mg/dL (ref 0.61–1.24)
Calcium: 8.3 mg/dL — ABNORMAL LOW (ref 8.9–10.3)
Chloride: 102 mmol/L (ref 98–111)
GFR calc Af Amer: >60 mL/min (ref >60)
GFR calc non Af Amer: >60 mL/min (ref >60)
GLUCOSE: 89 mg/dL (ref 70–99)
Potassium: 3.5 mmol/L (ref 3.5–5.1)
SODIUM: 139 mmol/L (ref 135–145)
Total Bilirubin: 1.1 mg/dL (ref 0.3–1.2)
Total Protein: 6.4 g/dL — ABNORMAL LOW (ref 6.5–8.1)

## 2018-01-27 LAB — CBC
HEMATOCRIT: 44.9 % (ref 39.0–52.0)
HEMOGLOBIN: 15.1 g/dL (ref 13.0–17.0)
MCH: 33.6 pg (ref 26.0–34.0)
MCHC: 33.6 g/dL (ref 30.0–36.0)
MCV: 100 fL (ref 78.0–100.0)
Platelets: 126 10*3/uL — ABNORMAL LOW (ref 150–400)
RBC: 4.49 MIL/uL (ref 4.22–5.81)
RDW: 13.3 % (ref 11.5–15.5)
WBC: 6.4 10*3/uL (ref 4.0–10.5)

## 2018-01-27 MED ORDER — CLOPIDOGREL BISULFATE 75 MG PO TABS
75.0000 mg | ORAL_TABLET | Freq: Every day | ORAL | Status: DC
  Administered 2018-01-27 – 2018-02-03 (×8): 75 mg via ORAL
  Filled 2018-01-27 (×8): qty 1

## 2018-01-27 MED ORDER — IOPAMIDOL (ISOVUE-370) INJECTION 76%
INTRAVENOUS | Status: AC
  Filled 2018-01-27: qty 50

## 2018-01-27 MED ORDER — IOPAMIDOL (ISOVUE-370) INJECTION 76%
50.0000 mL | Freq: Once | INTRAVENOUS | Status: AC | PRN
  Administered 2018-01-27: 50 mL via INTRAVENOUS

## 2018-01-27 NOTE — Progress Notes (Signed)
Physical Therapy Treatment Patient Details Name: Brian Huynh MRN: [id number] DOB: 11-30-1960 Today's Date: 01/27/2018    History of Present Illness Brian Huynh is 57 y.o. 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 PMH includes HepC, Jaundice, substance abuse, and alcohol abuse.      PT Comments    Patient is making progress toward PT goals. Pt requires min/mod A for gait training using SPC. Pt continues to present with R side weakness and impaired balance.  Current plan remains appropriate.   Follow Up Recommendations  CIR     Equipment Recommendations  None recommended by PT    Recommendations for Other Services OT consult     Precautions / Restrictions Precautions Precautions: Fall Restrictions Weight Bearing Restrictions: No    Mobility  Bed Mobility Overal bed mobility: Needs Assistance Bed Mobility: Supine to Sit     Supine to sit: Supervision     General bed mobility comments: cues for R UE positioning; Pt required increased time to sit up at EOB but required no physical assist.   Transfers Overall transfer level: Needs assistance Equipment used: None Transfers: Sit to/from Stand Sit to Stand: Min guard;Min assist         General transfer comment: Pt was able to perform sit<>stand with [facility] Min Guard assist for steadying. Pt had some difficulty and performed transfer with trunk flexed and was unsteady during transfer.   Ambulation/Gait Ambulation/Gait assistance: Min assist;Mod assist Gait Distance (Feet): 80 Feet Assistive device: Straight cane Gait Pattern/deviations: Step-to pattern;Decreased step length - right;Decreased step length - left;Decreased dorsiflexion - right Gait velocity: Decreased   General Gait Details: assistance for balance; cues for posture/forward gaze, sequencing, and increased bilat step lengths   Stairs             Wheelchair Mobility    Modified Rankin  (Stroke Patients Only)       Balance Overall balance assessment: Needs assistance Sitting-balance support: No upper extremity supported;Feet unsupported Sitting balance-Leahy Scale: Fair     Standing balance support: Single extremity supported;During functional activity Standing balance-Leahy Scale: Poor                              Cognition Arousal/Alertness: Awake/alert Behavior During Therapy: WFL for tasks assessed/performed Overall Cognitive Status: Within Functional Limits for tasks assessed                                        Exercises      General Comments        Pertinent Vitals/Pain Pain Assessment: No/denies pain Pain Intervention(s): Monitored during session    Home Living                      Prior Function            PT Goals (current goals can now be found in the care plan section) Acute Rehab PT Goals Patient Stated Goal: to get stronger Progress towards PT goals: Progressing toward goals    Frequency    Min 4X/week      PT Plan Current plan remains appropriate    Co-evaluation PT/OT/SLP Co-Evaluation/Treatment: Yes Reason for Co-Treatment: For patient/therapist safety;To address functional/ADL transfers PT goals addressed during session: Mobility/safety with mobility;Proper use of DME;Balance OT goals addressed during session:  ADL's and self-care;Strengthening/ROM      AM-PAC PT "6 Clicks" Daily Activity  Outcome Measure  Difficulty turning over in bed (including adjusting bedclothes, sheets and blankets)?: A Little Difficulty moving from lying on back to sitting on the side of the bed? : A Little Difficulty sitting down on and standing up from a chair with arms (e.g., wheelchair, bedside commode, etc,.)?: A Lot Help needed moving to and from a bed to chair (including a wheelchair)?: A Little Help needed walking in hospital room?: A Little Help needed climbing 3-5 steps with a railing? : A  Lot 6 Click Score: 16    End of Session Equipment Utilized During Treatment: Gait belt Activity Tolerance: Patient tolerated treatment well Patient left: in chair;with call bell/phone within reach;with chair alarm set Nurse Communication: Mobility status PT Visit Diagnosis: Unsteadiness on feet (R26.81);Other abnormalities of gait and mobility (R26.89);Muscle weakness (generalized) (M62.81);History of falling (Z91.81);Difficulty in walking, not elsewhere classified (R26.2);Other symptoms and signs involving the nervous system (R29.898);Pain;Hemiplegia and hemiparesis Hemiplegia - Right/Left: Right Hemiplegia - dominant/non-dominant: Dominant Hemiplegia - caused by: Cerebral infarction     Time: 6440-3474 PT Time Calculation (min) (ACUTE ONLY): 35 min  Charges:  $Gait Training: 8-22 mins                     [name], PTA Pager: [phone]     [name] 01/27/2018, 3:30 PM

## 2018-01-27 NOTE — Progress Notes (Signed)
    Patient was undergoing echo during my visit today. Right upper extremity symptoms subjectively improving. Will plan for left CEA within next 2 weeks with subsequent right CEA when he has recovered. Will discuss timing with patient tomorrow but will be ok to discharge home on dapt prior to operative intervention.   Makeya Hilgert C. Donzetta Matters, MD [facility] Office: [phone] Pager: [phone]

## 2018-01-27 NOTE — Progress Notes (Addendum)
PROGRESS NOTE  Brian Huynh MKL:[id number] DOB: 12-Oct-1960 DOA: 01/26/2018 PCP: [facility]  HPI/Recap of past 24 hours:  Feeling better, right sided weakness is improving Denies headache Reports some chronic intermittent seeing floaters Reports chronic back pain and restless leg  Assessment/Plan: Active Problems:   CVA (cerebral vascular accident) ([place])   Tobacco abuse   Essential hypertension   IV drug abuse ([place])   Thrombocytopenia ([place])  Stroke:  bilateral MCA/ACA watershed, right MCA/PCA watershed and b/l ACA patchy infarct, likely due to hypoperfusion from b/l high grade ICA stenosis. Neurology input appreciated, DAPT for three months then single agent Vascular surgery input appreciated, plan to have surgery in two weeks Cir/ vs snf placement  Elevation of lft From alcohol use? Prior h/o hep c, spontaneuously resolved per patient Will repeat hepatitis panel   Mild thrombocytopenia From alcohol? monitor  Polysubstance abuse + alcohol + cigarettes  reports recent use of heroin uds on presentation negative  I have reviewed tele strips, has been stable, will d/c tele  Code Status: full  Family Communication: patient   Disposition Plan: snf vs cir   Consultants:  Neurology  Vascular surgery  Procedures:  none  Antibiotics:  none   Objective: BP (!) 159/85 (BP Location: Right Arm)   Pulse 72   Temp 98.2 F (36.8 C) (Oral)   Resp 18   Ht 5\' 10"  (1.778 m)   Wt 94.3 kg (208 lb)   SpO2 100%   BMI 29.84 kg/m   Intake/Output Summary (Last 24 hours) at 01/27/2018 2249 Last data filed at 01/27/2018 2000 Gross per 24 hour  Intake 1264.76 ml  Output 750 ml  Net 514.76 ml   Filed Weights   01/26/18 1053  Weight: 94.3 kg (208 lb)    Exam: Patient is examined daily including today on 01/27/2018, exams remain the same as of yesterday except that has changed    General:  NAD  Cardiovascular: RRR  Respiratory: CTABL  Abdomen:  Soft/ND/NT, positive BS  Musculoskeletal: No Edema  Neuro: alert, oriented , right sided weakness is improving  Data Reviewed: Basic Metabolic Panel: Recent Labs  Lab 01/26/18 1102 01/26/18 1109 01/27/18 0231  NA 138 138 139  K 4.2 4.0 3.5  CL 102 102 102  CO2 25  --  27  GLUCOSE 101* 100* 89  BUN 9 11 12   CREATININE 1.16 1.10 1.08  CALCIUM 8.8*  --  8.3*   Liver Function Tests: Recent Labs  Lab 01/26/18 1102 01/27/18 0231  AST 70* 57*  ALT 78* 68*  ALKPHOS 64 58  BILITOT 1.9* 1.1  PROT 7.3 6.4*  ALBUMIN 3.3* 2.9*   No results for input(s): LIPASE, AMYLASE in the last 168 hours. No results for input(s): AMMONIA in the last 168 hours. CBC: Recent Labs  Lab 01/26/18 1102 01/26/18 1109 01/27/18 0231  WBC 10.4  --  6.4  NEUTROABS 7.8*  --   --   HGB 16.5 16.7 15.1  HCT 48.0 49.0 44.9  MCV 97.6  --  100.0  PLT 135*  --  126*   Cardiac Enzymes:   No results for input(s): CKTOTAL, CKMB, CKMBINDEX, TROPONINI in the last 168 hours. BNP (last 3 results) No results for input(s): BNP in the last 8760 hours.  ProBNP (last 3 results) No results for input(s): PROBNP in the last 8760 hours.  CBG: No results for input(s): GLUCAP in the last 168 hours.  Recent Results (from the past 240 hour(s))  Culture,  blood (routine x 2)     Status: None (Preliminary result)   Collection Time: 01/26/18  7:15 PM  Result Value Ref Range Status   Specimen Description BLOOD RIGHT THUMB  Final   Special Requests   Final    BOTTLES DRAWN AEROBIC AND ANAEROBIC Blood Culture adequate volume   Culture   Final    NO GROWTH < 24 HOURS Performed at [facility], [address] [place], [place] [address]    Report Status PENDING  Incomplete  Culture, blood (routine x 2)     Status: None (Preliminary result)   Collection Time: 01/26/18  7:20 PM  Result Value Ref Range Status   Specimen Description BLOOD RIGHT WRIST  Final   Special Requests   Final    BOTTLES DRAWN AEROBIC ONLY  Blood Culture results may not be optimal due to an inadequate volume of blood received in culture bottles   Culture   Final    NO GROWTH < 24 HOURS Performed at [facility], [place] [address] [place], [place] [address]    Report Status PENDING  Incomplete     Studies: Ct Angio Head W Or Wo Contrast  Result Date: 01/27/2018 CLINICAL DATA:  Stroke follow-up EXAM: CT ANGIOGRAPHY HEAD AND NECK TECHNIQUE: Multidetector CT imaging of the head and neck was performed using the standard protocol during bolus administration of intravenous contrast. Multiplanar CT image reconstructions and MIPs were obtained to evaluate the vascular anatomy. Carotid stenosis measurements (when applicable) are obtained utilizing NASCET criteria, using the distal internal carotid diameter as the denominator. CONTRAST:  53mL ISOVUE-370 IOPAMIDOL (ISOVUE-370) INJECTION 76% COMPARISON:  Brain MRI 01/26/2018 FINDINGS: CTA NECK FINDINGS AORTIC ARCH: There is minimal calcific atherosclerosis of the aortic arch. There is no aneurysm, dissection or hemodynamically significant stenosis of the visualized ascending aorta and aortic arch. Conventional 3 vessel aortic branching pattern. The visualized proximal subclavian arteries are widely patent. RIGHT CAROTID SYSTEM: --Common carotid artery: Widely patent origin without common carotid artery dissection or aneurysm. --Internal carotid artery: Prominently noncalcified plaque at the carotid bifurcation causes approximately 65% stenosis. --External carotid artery: No acute abnormality. LEFT CAROTID SYSTEM: --Common carotid artery: Widely patent origin without common carotid artery dissection or aneurysm. --Internal carotid artery:Predominantly noncalcified plaque at the left carotid bifurcation causes approximately 75% stenosis --External carotid artery: No acute abnormality. VERTEBRAL ARTERIES: Left dominant configuration. Both origins are normal. Right vertebral artery terminates in the right  PICA. Left vertebral artery is normal to the vertebrobasilar confluence. SKELETON: There is no bony spinal canal stenosis. No lytic or blastic lesion. OTHER NECK: Normal pharynx, larynx and major salivary glands. No cervical lymphadenopathy. Unremarkable thyroid gland. UPPER CHEST: No pneumothorax or pleural effusion. No nodules or masses. CTA HEAD FINDINGS ANTERIOR CIRCULATION: --Intracranial internal carotid arteries: Normal. --Anterior cerebral arteries: Normal. Both A1 segments are present. Patent anterior communicating artery. --Middle cerebral arteries: Normal. --Posterior communicating arteries: Present on the right. POSTERIOR CIRCULATION: --Basilar artery: Normal. --Posterior cerebral arteries: There is multifocal severe stenosis of the right posterior cerebral artery with multiple short segments of occlusion. --Superior cerebellar arteries: Normal. --Inferior cerebellar arteries: Normal anterior and posterior inferior cerebellar arteries. VENOUS SINUSES: Please refer to report for dedicated CT venogram. ANATOMIC VARIANTS: Nondominant right vertebral artery terminates in PICA. DELAYED PHASE: Not performed Review of the MIP images confirms the above findings. IMPRESSION: 1. No emergent large vessel occlusion. 2. Bilateral proximal internal carotid artery stenosis, measuring 65% on the right and 75% on the left.  3. Multifocal severe stenosis of the right PCA with multiple short segment occlusions. 4.  Aortic Atherosclerosis (ICD10-I70.0). Electronically Signed   By: Ulyses Jarred M.D.   On: 01/27/2018 02:14   Ct Angio Neck W Or Wo Contrast  Result Date: 01/27/2018 CLINICAL DATA:  Stroke follow-up EXAM: CT ANGIOGRAPHY HEAD AND NECK TECHNIQUE: Multidetector CT imaging of the head and neck was performed using the standard protocol during bolus administration of intravenous contrast. Multiplanar CT image reconstructions and MIPs were obtained to evaluate the vascular anatomy. Carotid stenosis measurements (when  applicable) are obtained utilizing NASCET criteria, using the distal internal carotid diameter as the denominator. CONTRAST:  20mL ISOVUE-370 IOPAMIDOL (ISOVUE-370) INJECTION 76% COMPARISON:  Brain MRI 01/26/2018 FINDINGS: CTA NECK FINDINGS AORTIC ARCH: There is minimal calcific atherosclerosis of the aortic arch. There is no aneurysm, dissection or hemodynamically significant stenosis of the visualized ascending aorta and aortic arch. Conventional 3 vessel aortic branching pattern. The visualized proximal subclavian arteries are widely patent. RIGHT CAROTID SYSTEM: --Common carotid artery: Widely patent origin without common carotid artery dissection or aneurysm. --Internal carotid artery: Prominently noncalcified plaque at the carotid bifurcation causes approximately 65% stenosis. --External carotid artery: No acute abnormality. LEFT CAROTID SYSTEM: --Common carotid artery: Widely patent origin without common carotid artery dissection or aneurysm. --Internal carotid artery:Predominantly noncalcified plaque at the left carotid bifurcation causes approximately 75% stenosis --External carotid artery: No acute abnormality. VERTEBRAL ARTERIES: Left dominant configuration. Both origins are normal. Right vertebral artery terminates in the right PICA. Left vertebral artery is normal to the vertebrobasilar confluence. SKELETON: There is no bony spinal canal stenosis. No lytic or blastic lesion. OTHER NECK: Normal pharynx, larynx and major salivary glands. No cervical lymphadenopathy. Unremarkable thyroid gland. UPPER CHEST: No pneumothorax or pleural effusion. No nodules or masses. CTA HEAD FINDINGS ANTERIOR CIRCULATION: --Intracranial internal carotid arteries: Normal. --Anterior cerebral arteries: Normal. Both A1 segments are present. Patent anterior communicating artery. --Middle cerebral arteries: Normal. --Posterior communicating arteries: Present on the right. POSTERIOR CIRCULATION: --Basilar artery: Normal.  --Posterior cerebral arteries: There is multifocal severe stenosis of the right posterior cerebral artery with multiple short segments of occlusion. --Superior cerebellar arteries: Normal. --Inferior cerebellar arteries: Normal anterior and posterior inferior cerebellar arteries. VENOUS SINUSES: Please refer to report for dedicated CT venogram. ANATOMIC VARIANTS: Nondominant right vertebral artery terminates in PICA. DELAYED PHASE: Not performed Review of the MIP images confirms the above findings. IMPRESSION: 1. No emergent large vessel occlusion. 2. Bilateral proximal internal carotid artery stenosis, measuring 65% on the right and 75% on the left. 3. Multifocal severe stenosis of the right PCA with multiple short segment occlusions. 4.  Aortic Atherosclerosis (ICD10-I70.0). Electronically Signed   By: Ulyses Jarred M.D.   On: 01/27/2018 02:14   Ct Venogram Head  Result Date: 01/27/2018 CLINICAL DATA:  Right-sided numbness and weakness. EXAM: CT VENOGRAM HEAD TECHNIQUE: Following contrast administration, vena graphic images of the intracranial circulation were acquired and reformatted and coronal and sagittal planes. CONTRAST:  68mL ISOVUE-370 IOPAMIDOL (ISOVUE-370) INJECTION 76% COMPARISON:  None. FINDINGS: Superior sagittal sinus: Normal. Straight sinus: Normal. Inferior sagittal sinus, vein of Galen and internal cerebral veins: Normal. Transverse sinuses: Normal. Sigmoid sinuses: Normal. Visualized jugular veins: Normal. IMPRESSION: Normal venogram of the head. Electronically Signed   By: Ulyses Jarred M.D.   On: 01/27/2018 02:19    Scheduled Meds: . aspirin  325 mg Oral Daily  . atorvastatin  80 mg Oral q1800  . citalopram  40 mg Oral Daily  . clopidogrel  75 mg Oral Daily  . enoxaparin (LOVENOX) injection  40 mg Subcutaneous Q24H  . folic acid  1 mg Oral Daily  . multivitamin with minerals  1 tablet Oral Daily  . nicotine  21 mg Transdermal Daily  . thiamine  100 mg Oral Daily   Or  .  thiamine  100 mg Intravenous Daily  . traZODone  100 mg Oral QHS    Continuous Infusions: . sodium chloride 75 mL/hr at 01/27/18 2000     Time spent: 61mins I have personally reviewed and interpreted on  01/27/2018 daily labs, tele strips, imagings as discussed above under date review session and assessment and plans.  I reviewed all nursing notes, pharmacy notes, consultant notes,  vitals, pertinent old records  I have discussed plan of care as described above with RN , patient on 01/27/2018   Florencia Reasons MD, PhD  [facility] Pager [phone]. If 7PM-7AM, please contact night-coverage at [email or web address], password [email or web address] 01/27/2018, 10:49 PM  LOS: 1 day

## 2018-01-27 NOTE — Progress Notes (Signed)
  Echocardiogram 2D Echocardiogram has been performed.  Brian Huynh 01/27/2018, 2:05 PM

## 2018-01-27 NOTE — Evaluation (Signed)
Occupational Therapy Evaluation Patient Details Name: Brian Huynh MRN: [id number] DOB: 27-Nov-1960 Today's Date: 01/27/2018    History of Present Illness Brian Huynh is 57 y.o. 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 PMH includes HepC, Jaundice, substance abuse, and alcohol abuse.     Clinical Impression   PTA Pt independent in ADL and mobility (SPC PRN). Pt is currently presenting with RUE deficits impacting BUE tasks, and balance deficits (see full OT problem list below). Pt is currently mod A for tasks that require BUE tasks, min guard assist for transfers. Pt will benefit from skilled OT in the acute setting as well as CIR level therapy at dc to maximize safety and independence in ADL and functional transfers. Pt is extremely motivated. Next session to establish HEP for RUE.    Follow Up Recommendations  CIR;Supervision/Assistance - 24 hour    Equipment Recommendations  Other (comment)(defer to next venue)    Recommendations for Other Services Rehab consult     Precautions / Restrictions Precautions Precautions: Fall Restrictions Weight Bearing Restrictions: No      Mobility Bed Mobility Overal bed mobility: Needs Assistance Bed Mobility: Supine to Sit     Supine to sit: Supervision     General bed mobility comments: Pt required increased time to sit up at EOB but required no physical assist.   Transfers Overall transfer level: Needs assistance Equipment used: None Transfers: Sit to/from Stand Sit to Stand: Min guard;Min assist         General transfer comment: Pt was able to perform sit<>stand with [facility] Min Guard assist for steadying. Pt had some difficulty and performed transfer with trunk flexed and was unsteady during transfer.     Balance Overall balance assessment: Needs assistance Sitting-balance support: No upper extremity supported;Feet unsupported Sitting balance-Leahy Scale: Fair Sitting  balance - Comments: Pt was able to scoot hips to EOB with LOB   Standing balance support: No upper extremity supported;Bilateral upper extremity supported;Single extremity supported Standing balance-Leahy Scale: Poor Standing balance comment: prolongued static standing, or standing during functional activity requires support                           ADL either performed or assessed with clinical judgement   ADL Overall ADL's : Needs assistance/impaired Eating/Feeding: Moderate assistance;Sitting Eating/Feeding Details (indicate cue type and reason): Pt required assist for opening containers and cutting food - able to eat with left hand/LUE Grooming: Moderate assistance;Wash/dry hands;Wash/dry face;Standing Grooming Details (indicate cue type and reason): increased time and effort for BUE tasks, mod A for managing fine motor tasks and maintaining standing balance at sink Upper Body Bathing: Minimal assistance   Lower Body Bathing: Minimal assistance   Upper Body Dressing : Minimal assistance   Lower Body Dressing: Moderate assistance Lower Body Dressing Details (indicate cue type and reason): to don socks Toilet Transfer: Min guard;Minimal assistance;Ambulation(SPC) Toilet Transfer Details (indicate cue type and reason): min guard assist for balance and safety Toileting- Clothing Manipulation and Hygiene: Min guard;Sitting/lateral lean       Functional mobility during ADLs: Min guard;Minimal assistance;Cane General ADL Comments: RUE most affected at the shoulder, decreased sensation, and increased time/effort for fine motor tasks     Vision Patient Visual Report: No change from baseline Vision Assessment?: No apparent visual deficits     Perception     Praxis      Pertinent Vitals/Pain Pain Assessment: No/denies  pain Pain Intervention(s): Monitored during session     Hand Dominance Left   Extremity/Trunk Assessment Upper Extremity Assessment Upper Extremity  Assessment: RUE deficits/detail RUE Deficits / Details: Pt had difficulty lifting R UE past ~100 degrees. Pt was able to perform fine motor and gross motor tasks, but with increased time and focusing, and lacked some coordination; grasp 3+/5, elbow flexion 3+/5, Shoulder flexion 3/5 RUE Sensation: decreased light touch RUE Coordination: decreased fine motor;decreased gross motor   Lower Extremity Assessment Lower Extremity Assessment: Defer to PT evaluation   Cervical / Trunk Assessment Cervical / Trunk Assessment: Normal   Communication Communication Communication: No difficulties   Cognition Arousal/Alertness: Awake/alert Behavior During Therapy: WFL for tasks assessed/performed Overall Cognitive Status: Within Functional Limits for tasks assessed                                     General Comments       Exercises     Shoulder Instructions      Home Living Family/patient expects to be discharged to:: Other (Comment)(Community center ) Living Arrangements: Non-relatives/Friends Available Help at Discharge: Available PRN/intermittently;Other (Comment)(Others living in the home, maybe staff) Type of Home: (Community center) Home Access: Ramped entrance;Level entry     Home Layout: Able to live on main level with bedroom/bathroom                          Prior Functioning/Environment Level of Independence: Independent;Independent with assistive device(s)        Comments: Pt reports occasionally using a cane to get around.         OT Problem List: Decreased strength;Decreased range of motion;Decreased activity tolerance;Impaired balance (sitting and/or standing);Decreased coordination;Decreased safety awareness;Decreased knowledge of use of DME or AE;Impaired sensation;Impaired UE functional use      OT Treatment/Interventions: Self-care/ADL training;Therapeutic exercise;Neuromuscular education;DME and/or AE instruction;Therapeutic  activities;Patient/family education;Balance training    OT Goals(Current goals can be found in the care plan section) Acute Rehab OT Goals Patient Stated Goal: to get stronger OT Goal Formulation: With patient Time For Goal Achievement: 02/10/18 Potential to Achieve Goals: Good  OT Frequency: Min 3X/week   Barriers to D/C:            Co-evaluation PT/OT/SLP Co-Evaluation/Treatment: Yes Reason for Co-Treatment: For patient/therapist safety;To address functional/ADL transfers PT goals addressed during session: Mobility/safety with mobility;Balance;Proper use of DME OT goals addressed during session: ADL's and self-care;Strengthening/ROM      AM-PAC PT "6 Clicks" Daily Activity     Outcome Measure Help from another person eating meals?: A Lot Help from another person taking care of personal grooming?: A Lot Help from another person toileting, which includes using toliet, bedpan, or urinal?: A Little Help from another person bathing (including washing, rinsing, drying)?: A Little Help from another person to put on and taking off regular upper body clothing?: A Little Help from another person to put on and taking off regular lower body clothing?: A Little 6 Click Score: 16   End of Session Equipment Utilized During Treatment: Gait belt;Other (comment)(SPC) Nurse Communication: Mobility status  Activity Tolerance: Patient tolerated treatment well Patient left: in chair;with call bell/phone within reach;with chair alarm set  OT Visit Diagnosis: Unsteadiness on feet (R26.81);Other abnormalities of gait and mobility (R26.89);Muscle weakness (generalized) (M62.81);Other symptoms and signs involving the nervous system (R29.898);Hemiplegia and hemiparesis Hemiplegia - Right/Left: Right Hemiplegia -  dominant/non-dominant: Non-Dominant Hemiplegia - caused by: Cerebral infarction                Time: 4827-0786 OT Time Calculation (min): 35 min Charges:  OT General Charges $OT Visit: 1  Visit OT Evaluation $OT Eval Moderate Complexity: 1 Mod  Hulda Humphrey OTR/L [place] 01/27/2018, 11:34 AM

## 2018-01-27 NOTE — Clinical Social Work Note (Signed)
Clinical Social Work Assessment  Patient Details  Name: Brian Huynh MRN: [id number] Date of Birth: [birth year]  Date of referral:  01/27/18               Reason for consult:  Facility Placement                Permission sought to share information with:  Chartered certified accountant granted to share information::  Yes, Verbal Permission Granted  Name::        Agency::  [facility]  Relationship::     Contact Information:     Housing/Transportation Living arrangements for the past 2 months:  Homeless Shelter Source of Information:  Patient, Medical Team Patient Interpreter Needed:  None Criminal Activity/Legal Involvement Pertinent to Current Situation/Hospitalization:  No - Comment as needed Significant Relationships:  Adult Children, Siblings Lives with:  Self Do you feel safe going back to the place where you live?  Yes Need for family participation in patient care:  No (Coment)  Care giving concerns:  Patient from home but will need short term rehab at discharge.   Social Worker assessment / plan:  CSW spoke with patient and discussed recommendations for rehab at discharge. CSW received permission to send information to the [place] for review, and then follow up with SNF options. CSW to follow.  Employment status:    Insurance information:  [facility] Benefit PT Recommendations:  Inpatient Rehab Consult Information / Referral to community resources:  [place]  [name] Response to care:  Patient agreeable to SNF placement.  [name] Understanding of and Emotional Response to Diagnosis, Current Treatment, and Prognosis:  Patient discussed how he knows that he can't do things as independently as before and he'll need rehabilitation. Patient appreciative of CSW assistance and aware of how much paperwork the [place] requires.  Emotional Assessment Appearance:  Appears stated age Attitude/Demeanor/Rapport:  Engaged Affect (typically observed):   [place] Orientation:  Oriented to Self, Oriented to Place, Oriented to  Time, Oriented to Situation Alcohol / Substance use:  Not Applicable Psych involvement (Current and /or in the community):  No (Comment)  Discharge Needs  Concerns to be addressed:  Care Coordination Readmission within the last 30 days:  No Current discharge risk:  Dependent with Mobility Barriers to Discharge:  Continued Medical Work up, [place], [place] 01/27/2018, 12:02 PM

## 2018-01-27 NOTE — Progress Notes (Signed)
Inpatient Rehabilitation  Per PT and OT request, patient was screened by Gunnar Fusi for appropriateness for an Inpatient Acute Rehab consult.  At this time we are recommending an Inpatient Rehab consult.  Notified attending MD via text page.  Please order if you are agreeable.    Carmelia Roller., CCC/SLP Admission Coordinator  [place]  Cell [phone]

## 2018-01-27 NOTE — Progress Notes (Signed)
STROKE TEAM PROGRESS NOTE   SUBJECTIVE (INTERVAL HISTORY) His RN is at the bedside.  Overall he feels his condition is stable. Pt lying in bed. No complains. He still has mild RUE weakness but RLE seems strong. VVS will plan for CEAs. PT/OT recommend CIR.    OBJECTIVE Temp:  [97.7 F (36.5 C)-98.9 F (37.2 C)] 97.8 F (36.6 C) (08/07 0814) Pulse Rate:  [59-71] 61 (08/07 0005) Cardiac Rhythm: Normal sinus rhythm (08/07 0700) Resp:  [14-20] 18 (08/07 0814) BP: (110-152)/(65-103) 136/65 (08/07 0814) SpO2:  [91 %-100 %] 91 % (08/07 0005)  No results for input(s): GLUCAP in the last 168 hours. Recent Labs  Lab 01/26/18 1102 01/26/18 1109 01/27/18 0231  NA 138 138 139  K 4.2 4.0 3.5  CL 102 102 102  CO2 25  --  27  GLUCOSE 101* 100* 89  BUN 9 11 12   CREATININE 1.16 1.10 1.08  CALCIUM 8.8*  --  8.3*   Recent Labs  Lab 01/26/18 1102 01/27/18 0231  AST 70* 57*  ALT 78* 68*  ALKPHOS 64 58  BILITOT 1.9* 1.1  PROT 7.3 6.4*  ALBUMIN 3.3* 2.9*   Recent Labs  Lab 01/26/18 1102 01/26/18 1109 01/27/18 0231  WBC 10.4  --  6.4  NEUTROABS 7.8*  --   --   HGB 16.5 16.7 15.1  HCT 48.0 49.0 44.9  MCV 97.6  --  100.0  PLT 135*  --  126*   No results for input(s): CKTOTAL, CKMB, CKMBINDEX, TROPONINI in the last 168 hours. Recent Labs    01/26/18 1102  LABPROT 14.2  INR 1.11   Recent Labs    01/26/18 1055  COLORURINE YELLOW  LABSPEC 1.011  PHURINE 6.0  GLUCOSEU NEGATIVE  HGBUR SMALL*  BILIRUBINUR NEGATIVE  KETONESUR 5*  PROTEINUR NEGATIVE  NITRITE NEGATIVE  LEUKOCYTESUR NEGATIVE       Component Value Date/Time   CHOL 147 01/26/2018 1102   TRIG 72 01/26/2018 1102   HDL 25 (L) 01/26/2018 1102   CHOLHDL 5.9 01/26/2018 1102   VLDL 14 01/26/2018 1102   LDLCALC 108 (H) 01/26/2018 1102   Lab Results  Component Value Date   HGBA1C 4.6 (L) 01/26/2018      Component Value Date/Time   LABOPIA NONE DETECTED 01/26/2018 1055   COCAINSCRNUR NONE DETECTED 01/26/2018  1055   COCAINSCRNUR NEGATIVE 03/28/2013 1957   LABBENZ NONE DETECTED 01/26/2018 1055   LABBENZ NEGATIVE 03/28/2013 1957   AMPHETMU NONE DETECTED 01/26/2018 1055   THCU NONE DETECTED 01/26/2018 1055   LABBARB NONE DETECTED 01/26/2018 1055    Recent Labs  Lab 01/26/18 1516  ETH <10    I have personally reviewed the radiological images below and agree with the radiology interpretations.  Ct Angio Head W Or Wo Contrast  Result Date: 01/27/2018 CLINICAL DATA:  Stroke follow-up EXAM: CT ANGIOGRAPHY HEAD AND NECK TECHNIQUE: Multidetector CT imaging of the head and neck was performed using the standard protocol during bolus administration of intravenous contrast. Multiplanar CT image reconstructions and MIPs were obtained to evaluate the vascular anatomy. Carotid stenosis measurements (when applicable) are obtained utilizing NASCET criteria, using the distal internal carotid diameter as the denominator. CONTRAST:  66mL ISOVUE-370 IOPAMIDOL (ISOVUE-370) INJECTION 76% COMPARISON:  Brain MRI 01/26/2018 FINDINGS: CTA NECK FINDINGS AORTIC ARCH: There is minimal calcific atherosclerosis of the aortic arch. There is no aneurysm, dissection or hemodynamically significant stenosis of the visualized ascending aorta and aortic arch. Conventional 3 vessel aortic branching pattern. The visualized  proximal subclavian arteries are widely patent. RIGHT CAROTID SYSTEM: --Common carotid artery: Widely patent origin without common carotid artery dissection or aneurysm. --Internal carotid artery: Prominently noncalcified plaque at the carotid bifurcation causes approximately 65% stenosis. --External carotid artery: No acute abnormality. LEFT CAROTID SYSTEM: --Common carotid artery: Widely patent origin without common carotid artery dissection or aneurysm. --Internal carotid artery:Predominantly noncalcified plaque at the left carotid bifurcation causes approximately 75% stenosis --External carotid artery: No acute abnormality.  VERTEBRAL ARTERIES: Left dominant configuration. Both origins are normal. Right vertebral artery terminates in the right PICA. Left vertebral artery is normal to the vertebrobasilar confluence. SKELETON: There is no bony spinal canal stenosis. No lytic or blastic lesion. OTHER NECK: Normal pharynx, larynx and major salivary glands. No cervical lymphadenopathy. Unremarkable thyroid gland. UPPER CHEST: No pneumothorax or pleural effusion. No nodules or masses. CTA HEAD FINDINGS ANTERIOR CIRCULATION: --Intracranial internal carotid arteries: Normal. --Anterior cerebral arteries: Normal. Both A1 segments are present. Patent anterior communicating artery. --Middle cerebral arteries: Normal. --Posterior communicating arteries: Present on the right. POSTERIOR CIRCULATION: --Basilar artery: Normal. --Posterior cerebral arteries: There is multifocal severe stenosis of the right posterior cerebral artery with multiple short segments of occlusion. --Superior cerebellar arteries: Normal. --Inferior cerebellar arteries: Normal anterior and posterior inferior cerebellar arteries. VENOUS SINUSES: Please refer to report for dedicated CT venogram. ANATOMIC VARIANTS: Nondominant right vertebral artery terminates in PICA. DELAYED PHASE: Not performed Review of the MIP images confirms the above findings. IMPRESSION: 1. No emergent large vessel occlusion. 2. Bilateral proximal internal carotid artery stenosis, measuring 65% on the right and 75% on the left. 3. Multifocal severe stenosis of the right PCA with multiple short segment occlusions. 4.  Aortic Atherosclerosis (ICD10-I70.0). Electronically Signed   By: Ulyses Jarred M.D.   On: 01/27/2018 02:14   Ct Head Wo Contrast  Result Date: 01/26/2018 CLINICAL DATA:  CT head w/o contrast due to focal neuro deficit Pt [facility] via EMS; Last night around 2100 pt started experiencing weakness and numbness to R side; mechanical fall last night, hit head, no LOC, EXAM: CT HEAD  WITHOUT CONTRAST TECHNIQUE: Contiguous axial images were obtained from the base of the skull through the vertex without intravenous contrast. COMPARISON:  None. FINDINGS: Brain: No acute intracranial hemorrhage. No focal mass lesion. No CT evidence of acute infarction. No midline shift or mass effect. No hydrocephalus. Basilar cisterns are patent. Subcortical white matter hypodensities in the high frontal lobes. Vascular: No hyperdense vessel or unexpected calcification. Skull: Normal. Negative for fracture or focal lesion. Sinuses/Orbits: Paranasal sinuses and mastoid air cells are clear. Orbits are clear. Other: None. IMPRESSION: 1. No acute intracranial findings. 2. White matter microvascular disease Electronically Signed   By: Suzy Bouchard M.D.   On: 01/26/2018 11:48   Ct Angio Neck W Or Wo Contrast  Result Date: 01/27/2018 CLINICAL DATA:  Stroke follow-up EXAM: CT ANGIOGRAPHY HEAD AND NECK TECHNIQUE: Multidetector CT imaging of the head and neck was performed using the standard protocol during bolus administration of intravenous contrast. Multiplanar CT image reconstructions and MIPs were obtained to evaluate the vascular anatomy. Carotid stenosis measurements (when applicable) are obtained utilizing NASCET criteria, using the distal internal carotid diameter as the denominator. CONTRAST:  76mL ISOVUE-370 IOPAMIDOL (ISOVUE-370) INJECTION 76% COMPARISON:  Brain MRI 01/26/2018 FINDINGS: CTA NECK FINDINGS AORTIC ARCH: There is minimal calcific atherosclerosis of the aortic arch. There is no aneurysm, dissection or hemodynamically significant stenosis of the visualized ascending aorta and aortic arch. Conventional 3 vessel aortic branching pattern.  The visualized proximal subclavian arteries are widely patent. RIGHT CAROTID SYSTEM: --Common carotid artery: Widely patent origin without common carotid artery dissection or aneurysm. --Internal carotid artery: Prominently noncalcified plaque at the carotid  bifurcation causes approximately 65% stenosis. --External carotid artery: No acute abnormality. LEFT CAROTID SYSTEM: --Common carotid artery: Widely patent origin without common carotid artery dissection or aneurysm. --Internal carotid artery:Predominantly noncalcified plaque at the left carotid bifurcation causes approximately 75% stenosis --External carotid artery: No acute abnormality. VERTEBRAL ARTERIES: Left dominant configuration. Both origins are normal. Right vertebral artery terminates in the right PICA. Left vertebral artery is normal to the vertebrobasilar confluence. SKELETON: There is no bony spinal canal stenosis. No lytic or blastic lesion. OTHER NECK: Normal pharynx, larynx and major salivary glands. No cervical lymphadenopathy. Unremarkable thyroid gland. UPPER CHEST: No pneumothorax or pleural effusion. No nodules or masses. CTA HEAD FINDINGS ANTERIOR CIRCULATION: --Intracranial internal carotid arteries: Normal. --Anterior cerebral arteries: Normal. Both A1 segments are present. Patent anterior communicating artery. --Middle cerebral arteries: Normal. --Posterior communicating arteries: Present on the right. POSTERIOR CIRCULATION: --Basilar artery: Normal. --Posterior cerebral arteries: There is multifocal severe stenosis of the right posterior cerebral artery with multiple short segments of occlusion. --Superior cerebellar arteries: Normal. --Inferior cerebellar arteries: Normal anterior and posterior inferior cerebellar arteries. VENOUS SINUSES: Please refer to report for dedicated CT venogram. ANATOMIC VARIANTS: Nondominant right vertebral artery terminates in PICA. DELAYED PHASE: Not performed Review of the MIP images confirms the above findings. IMPRESSION: 1. No emergent large vessel occlusion. 2. Bilateral proximal internal carotid artery stenosis, measuring 65% on the right and 75% on the left. 3. Multifocal severe stenosis of the right PCA with multiple short segment occlusions. 4.   Aortic Atherosclerosis (ICD10-I70.0). Electronically Signed   By: Ulyses Jarred M.D.   On: 01/27/2018 02:14   Mr Brain Wo Contrast  Result Date: 01/26/2018 CLINICAL DATA:  Acute onset RIGHT extremity weakness beginning yesterday. EXAM: MRI HEAD WITHOUT CONTRAST TECHNIQUE: Axial and coronal diffusion weighted imaging. Patient did not wish to continue examination and study was terminated. COMPARISON:  CT HEAD January 26, 2018 FINDINGS: INTRACRANIAL CONTENTS: Patchy reduced diffusion bilateral frontoparietal lobes with low ADC values on the LEFT, predominantly normalized ADC values on the RIGHT. Reduced diffusion RIGHT occipital lobe with T2 shine through. No midline shift or mass effect. Borderline parenchymal brain volume loss. VASCULAR: Nondiagnostic. SKULL AND UPPER CERVICAL SPINE: No reduced diffusion within the calvarium to suggest hypercellular tumor. SINUSES/ORBITS: Nondiagnostic. OTHER: None. IMPRESSION: 1. Limited 2 sequence MRI head: Acute LEFT frontal infarcts. Subacute RIGHT frontal and biparietal infarcts. Constellation of findings concerning for vasculopathy or embolic phenomena, less likely PRES or venous infarcts. Given limited tolerance of MRI, consider repeat examination with sedation, MRA versus CTA head and neck. 2. Old RIGHT occipital lobe/PCA territory infarct. 3. Borderline parenchymal brain volume loss. Electronically Signed   By: Elon Alas M.D.   On: 01/26/2018 13:45   Ct Venogram Head  Result Date: 01/27/2018 CLINICAL DATA:  Right-sided numbness and weakness. EXAM: CT VENOGRAM HEAD TECHNIQUE: Following contrast administration, vena graphic images of the intracranial circulation were acquired and reformatted and coronal and sagittal planes. CONTRAST:  77mL ISOVUE-370 IOPAMIDOL (ISOVUE-370) INJECTION 76% COMPARISON:  None. FINDINGS: Superior sagittal sinus: Normal. Straight sinus: Normal. Inferior sagittal sinus, vein of Galen and internal cerebral veins: Normal. Transverse sinuses:  Normal. Sigmoid sinuses: Normal. Visualized jugular veins: Normal. IMPRESSION: Normal venogram of the head. Electronically Signed   By: Ulyses Jarred M.D.   On: 01/27/2018 02:19  Carotid Doppler  Bilateral carotid duplex exam completed. Bilateral ICA 80-99% stenosis.  Bilateral vertebral arteries patent with antegrade flow.     TTE pending   PHYSICAL EXAM  Temp:  [97.7 F (36.5 C)-98.9 F (37.2 C)] 97.8 F (36.6 C) (08/07 0814) Pulse Rate:  [59-71] 61 (08/07 0005) Resp:  [14-20] 18 (08/07 0814) BP: (110-152)/(65-103) 136/65 (08/07 0814) SpO2:  [91 %-100 %] 91 % (08/07 0005)  General - Well nourished, well developed, in no apparent distress.  Ophthalmologic - fundi not visualized due to noncooperation.  Cardiovascular - Regular rate and rhythm.  Mental Status -  Level of arousal and orientation to time, place, and person were intact. Language including expression, naming, repetition, comprehension was assessed and found intact. Fund of Knowledge was assessed and was intact.  Cranial Nerves II - XII - II - Visual field intact OU. III, IV, VI - Extraocular movements intact. V - Facial sensation intact bilaterally. VII - subtle right nasolabial fold flattening. VIII - Hearing & vestibular intact bilaterally. X - Palate elevates symmetrically. XI - Chin turning & shoulder shrug intact bilaterally. XII - Tongue protrusion intact.  Motor Strength - The patient's strength was normal in all extremities except RUE 3+/5 deltoid, 4/5 bicep, 4+/5 tricep, 4/5 finger grip and pronator drift was present on the right.  Bulk was normal and fasciculations were absent.   Motor Tone - Muscle tone was assessed at the neck and appendages and was normal.  Reflexes - The patient's reflexes were symmetrical in all extremities and he had no pathological reflexes.  Sensory - Light touch, temperature/pinprick were assessed and were symmetrical except RLE upper thigh decreased light touch  sensation.    Coordination - The patient had normal movements in the hands with no ataxia or dysmetria.  Tremor was absent.  Gait and Station - deferred.   ASSESSMENT/PLAN Mr. MAURICO PERRELL is a 57 y.o. male with history of alcohol abuse, substance use, HepC, LBP and smoker admitted for right sided weakness. No tPA given due to OSW.    Stroke:  bilateral MCA/ACA watershed, right MCA/PCA watershed and b/l ACA patchy infarct, likely due to hypoperfusion from b/l high grade ICA stenosis.  Resultant RUE weakness  MRI  bilateral MCA/ACA watershed, right MCA/PCA watershed and b/l ACA patchy infarct  CTA head and neck - b/l ICA proximal high grade stenosis, right PCA stenosis  Carotid Doppler  B/l ICA 80-99% stenosis  2D Echo  pending  LDL 108  HgbA1c 4.6  lovenox for VTE prophylaxis  No antithrombotic prior to admission, now on aspirin 325 mg daily and clopidogrel 75 mg daily. Continue DAPT for 3 months and then either ASA or plavix alone.   Patient counseled to be compliant with his antithrombotic medications  Ongoing aggressive stroke risk factor management  Therapy recommendations:  Pending   Disposition:  Pending   B/l carotid artery severe stenosis  CTA and CUS confirmed b/l significant stenosis  Risk factor including smoker, substance use, alcohol use, HLD  VVS on board  Due to right sided weakness, agree with left CEA first.   Recommend CEA ASAP within 2 weeks  BP goal 130-160 prior to CEA given severe stenosis  Hypertension Stable  BP goal 130-160 prior to CEA  Hyperlipidemia  Home meds:  none   LDL 108, goal < 70  Now on lipitor 80  Continue statin at discharge  However, needs to closely watch LFT with PCP, as his AST 57 and ALT 68  Tobacco  abuse  Current smoker  Smoking cessation counseling provided  Nicotine patch provided  Pt is willing to quit  Alcohol abuse  Heavy drinker  Recommend to gradually limit alcohol use to less  than 2 drinks per day  Pt is in agreement  On FA/B1/MVI  Substance abuse  UDS neg  Pt admitted for heroin use  Cessation education provided  Other Stroke Risk Factors    Other Active Problems  LBP with sciatica   Hospital day # 1  Neurology will sign off. Please call with questions. Pt will follow up with stroke clinic NP at [facility] in about 4 weeks. Thanks for the consult.   Rosalin Hawking, MD PhD Stroke Neurology 01/27/2018 1:33 PM    To contact Stroke Continuity provider, please refer to [email or web address] After hours, contact General Neurology

## 2018-01-27 NOTE — NC FL2 (Signed)
[place] MEDICAID FL2 LEVEL OF CARE SCREENING TOOL     IDENTIFICATION  Patient Name: Brian Huynh Birthdate: [birth year] Sex: male Admission Date (Current Location): 01/26/2018  [facility] and [place] Number:  Herbalist and Address:  [facility] [place] [address], [place], [place] [address]      Provider Number: [id number]  Attending Physician Name and Address:  Florencia Reasons, MD  Relative Name and Phone Number:       Current Level of Care: Hospital Recommended Level of Care: [place] Prior Approval Number:    Date Approved/Denied:   PASRR Number: [id number]  Discharge Plan: SNF    Current Diagnoses: Patient Active Problem List   Diagnosis Date Noted  . CVA (cerebral vascular accident) ([place]) 01/26/2018  . Restless legs syndrome 05/13/2013  . Special screening for malignant neoplasms, colon 05/13/2013  . Hepatitis C, acute 03/31/2013  . ETOH abuse 03/29/2013  . Cholecystitis, acute 03/29/2013  . Transaminitis 03/28/2013    Orientation RESPIRATION BLADDER Height & Weight     Self, Time, Situation, Place  Normal Continent Weight: 208 lb (94.3 kg) Height:  5\' 10"  (177.8 cm)  BEHAVIORAL SYMPTOMS/MOOD NEUROLOGICAL BOWEL NUTRITION STATUS      Continent Diet(regular)  AMBULATORY STATUS COMMUNICATION OF NEEDS Skin   Extensive Assist Verbally Normal                       Personal Care Assistance Level of Assistance  Bathing, Feeding, Dressing Bathing Assistance: Limited assistance Feeding assistance: Independent Dressing Assistance: Limited assistance     Functional Limitations Info  Sight, Hearing, Speech Sight Info: Adequate Hearing Info: Adequate Speech Info: Adequate    SPECIAL CARE FACTORS FREQUENCY  PT (By licensed PT), OT (By licensed OT)     PT Frequency: 5x/wk OT Frequency: 5x/wk            Contractures Contractures Info: Not present    Additional Factors Info  Code Status, Allergies,  Psychotropic Code Status Info: Full Allergies Info: Voltaren Diclofenac Psychotropic Info: Celexa 40mg           Current Medications (01/27/2018):  This is the current hospital active medication list Current Facility-Administered Medications  Medication Dose Route Frequency Provider Last Rate Last Dose  . 0.9 %  sodium chloride infusion   Intravenous Continuous Kayleen Memos, DO 75 mL/hr at 01/27/18 0005    . aspirin tablet 325 mg  325 mg Oral Daily Irene Pap N, DO   325 mg at 01/27/18 1030  . atorvastatin (LIPITOR) tablet 80 mg  80 mg Oral q1800 Irene Pap N, DO   80 mg at 01/26/18 1737  . citalopram (CELEXA) tablet 40 mg  40 mg Oral Daily Irene Pap N, DO   40 mg at 01/27/18 1031  . clopidogrel (PLAVIX) tablet 75 mg  75 mg Oral Daily Rosalin Hawking, MD      . enoxaparin (LOVENOX) injection 40 mg  40 mg Subcutaneous Q24H Hall, Carole N, DO   40 mg at 01/26/18 1737  . folic acid (FOLVITE) tablet 1 mg  1 mg Oral Daily Horace, Carole N, DO   1 mg at 01/27/18 1032  . LORazepam (ATIVAN) tablet 1 mg  1 mg Oral Q6H PRN Irene Pap N, DO       Or  . LORazepam (ATIVAN) injection 1 mg  1 mg Intravenous Q6H PRN Irene Pap N, DO   1 mg at 01/26/18 2042  . multivitamin  with minerals tablet 1 tablet  1 tablet Oral Daily Irene Pap N, DO   1 tablet at 01/27/18 1030  . nicotine (NICODERM CQ - dosed in mg/24 hours) patch 21 mg  21 mg Transdermal Daily Hall, Carole N, DO   21 mg at 01/27/18 1039  . oxyCODONE (Oxy IR/ROXICODONE) immediate release tablet 5 mg  5 mg Oral Q4H PRN Schorr, Rhetta Mura, NP      . thiamine (VITAMIN B-1) tablet 100 mg  100 mg Oral Daily Hall, Carole N, DO   100 mg at 01/27/18 1030   Or  . thiamine (B-1) injection 100 mg  100 mg Intravenous Daily Irene Pap N, DO   100 mg at 01/26/18 1738  . traZODone (DESYREL) tablet 100 mg  100 mg Oral QHS Irene Pap N, DO   100 mg at 01/26/18 2202     Discharge Medications: Please see discharge summary for a list of discharge  medications.  Relevant Imaging Results:  Relevant Lab Results:   Additional Information SS#: [id number]  Geralynn Ochs, LCSW

## 2018-01-27 NOTE — Consult Note (Signed)
Physical Medicine and Rehabilitation Consult Reason for Consult: Right side weakness Referring Physician: Dr.Xu   HPI: Brian Huynh is a 57 y.o. right-handed male with history of alcohol and tobacco abuse, hypertension, reported IV drug use.  Per chart review and patient, patient has been residing at the [facility] home for the past 9 months.  Independent with a cane prior to admission.  He goes to the dining hall for his meals.  Presented 01/26/2018 with right-sided weakness.  Urine drug screen negative.  Cranial CT scan reviewed, unremarkable for acute intracranial process..  Patient did not receive TPA.  MRI showed acute left frontal infarct.  Subacute right frontal and biparietal infarct.  Old right occipital lobe PCA territory infarct.  CT angiogram of head and neck with no emergent large vessel occlusion.  Bilateral proximal internal carotid artery stenosis measuring 65% on the right and 75% on the left.  CT venogram negative.  Echocardiogram is pending.  Carotid Doppler showed bilateral ICA 60 to 99%.  Vascular surgery Dr. Donzetta Matters consulted in regards to ICA stenosis and await plan for bilateral carotid endarterectomies.  Currently maintained on aspirin for CVA prophylaxis.  Subcutaneous Lovenox for DVT prophylaxis.  Tolerating a regular diet.  Physical and occupational therapy evaluations completed with recommendations of physical medicine rehab consult.   Review of Systems  Constitutional: Negative for chills and fever.  HENT: Negative for hearing loss.   Eyes: Negative for blurred vision and double vision.  Respiratory: Negative for cough and shortness of breath.   Cardiovascular: Negative for chest pain, palpitations and leg swelling.  Gastrointestinal: Positive for constipation. Negative for nausea and vomiting.  Genitourinary: Negative for dysuria, flank pain and hematuria.  Musculoskeletal: Positive for back pain.  Skin: Negative for rash.  Neurological: Positive for  sensory change, focal weakness and headaches.  All other systems reviewed and are negative.  Past Medical History:  Diagnosis Date  . Alcohol abuse   . Cholecystitis   . Chronic back pain    "mostly lower; some in my middle" (01/26/2018)  . CVA (cerebral vascular accident) ([place]) 01/25/2018   bilateral left and right subacute infarcts in the frontal and biparietal area/notes 01/26/2018;  . Daily headache   . Hepatitis C   . Hypertension    "not high enough to take RX for it" (01/26/2018)  . Jaundice   . Sciatica   . Transaminitis    Past Surgical History:  Procedure Laterality Date  . APPENDECTOMY    . BACK SURGERY    . INGUINAL HERNIA REPAIR Right   . KNEE ARTHROSCOPY Left   . LUMBAR LAMINECTOMY  X 2  . ORCHIOPEXY Right    Family History  Problem Relation Age of Onset  . Stroke Brother        x 2 brothers  . AAA (abdominal aortic aneurysm) Father   . Diabetes Mother   . Lupus Mother    Social History:  reports that he has been smoking cigarettes.  He has a 38.00 pack-year smoking history. He has quit using smokeless tobacco. His smokeless tobacco use included snuff and chew. He reports that he drinks about 4.8 oz of alcohol per week. He reports that he has current or past drug history. Allergies:  Allergies  Allergen Reactions  . Voltaren [Diclofenac] Rash    Voltaren gel caused red bumps on the skin   Medications Prior to Admission  Medication Sig Dispense Refill  . citalopram (CELEXA) 40 MG tablet Take 40  mg by mouth daily.    . naproxen sodium (ALEVE) 220 MG tablet Take 440 mg by mouth daily as needed (pain).    . traZODone (DESYREL) 100 MG tablet Take 100 mg by mouth at bedtime.    . cyclobenzaprine (FLEXERIL) 10 MG tablet Take 1 tablet (10 mg total) by mouth 2 (two) times daily as needed for muscle spasms. (Patient not taking: Reported on 04/29/2017) 20 tablet 0  . HYDROcodone-acetaminophen (NORCO/VICODIN) 5-325 MG tablet Take 1 tablet every 4 (four) hours as needed by  mouth. (Patient not taking: Reported on 01/26/2018) 6 tablet 0  . ibuprofen (ADVIL,MOTRIN) 400 MG tablet Take 1 tablet (400 mg total) by mouth every 6 (six) hours as needed. (Patient not taking: Reported on 04/29/2017) 30 tablet 0  . lidocaine (LIDODERM) 5 % Place 1 patch daily onto the skin. Remove & Discard patch within 12 hours or as directed by MD (Patient not taking: Reported on 01/26/2018) 30 patch 0  . oxyCODONE (ROXICODONE) 5 MG immediate release tablet Take 1 tablet (5 mg total) by mouth every 4 (four) hours as needed for severe pain. (Patient not taking: Reported on 04/29/2017) 15 tablet 0  . polyethylene glycol (MIRALAX / GLYCOLAX) packet Take 17 g by mouth daily. (Patient not taking: Reported on 04/29/2017) 14 each 0    Home: Home Living Family/patient expects to be discharged to:: Other (Comment)(Community center ) Living Arrangements: Non-relatives/Friends Available Help at Discharge: Available PRN/intermittently, Other (Comment)(Others living in the home, maybe staff) Type of Home: (Community center) Home Access: Ramped entrance, Level entry [place]: Able to live on main level with bedroom/bathroom  Functional History: Prior Function Level of Independence: Independent, Independent with assistive device(s) Comments: Pt reports occasionally using a cane to get around.  Functional Status:  Mobility: Bed Mobility Overal bed mobility: Needs Assistance Bed Mobility: Supine to Sit Supine to sit: Supervision General bed mobility comments: Pt required increased time to sit up at EOB but required no physical assist.  Transfers Overall transfer level: Needs assistance Equipment used: None Transfers: Sit to/from Stand Sit to Stand: Min guard, Min assist General transfer comment: Pt was able to perform sit<>stand with [facility] Min Guard assist for steadying. Pt had some difficulty and performed transfer with trunk flexed and was unsteady during transfer.  Ambulation/Gait Ambulation/Gait  assistance: Min assist, Min guard Gait Distance (Feet): 40 Feet Assistive device: Rolling walker (2 wheeled), None Gait Pattern/deviations: Step-to pattern, Decreased step length - right, Decreased stride length, Decreased weight shift to right General Gait Details: Pt had some difficulty walking and required MinG-MinA. Pt ambulated without RW, but seemed unsteady without AD and had one LOB without RW. Pt ambulated 20 ft with RW, and had one visible LOB during turning with RW. Pt had difficulty gripping RW with R hand, and led with L leg during ambulation. PT noticed no buckling in R knee during ambulation. Pt reported some fatigue during ambulation, and PT returned pt to chair.  Gait velocity: Decreased    ADL: ADL Overall ADL's : Needs assistance/impaired Eating/Feeding: Moderate assistance, Sitting Eating/Feeding Details (indicate cue type and reason): Pt required assist for opening containers and cutting food - able to eat with left hand/LUE Grooming: Moderate assistance, Wash/dry hands, Wash/dry face, Standing Grooming Details (indicate cue type and reason): increased time and effort for BUE tasks, mod A for managing fine motor tasks and maintaining standing balance at sink Upper Body Bathing: Minimal assistance Lower Body Bathing: Minimal assistance Upper Body Dressing : Minimal assistance Lower  Body Dressing: Moderate assistance Lower Body Dressing Details (indicate cue type and reason): to don socks Toilet Transfer: Min guard, Minimal assistance, Ambulation(SPC) Toilet Transfer Details (indicate cue type and reason): min guard assist for balance and safety Toileting- Clothing Manipulation and Hygiene: Min guard, Sitting/lateral lean Functional mobility during ADLs: Min guard, Minimal assistance, Cane General ADL Comments: RUE most affected at the shoulder, decreased sensation, and increased time/effort for fine motor tasks  Cognition: Cognition Overall Cognitive Status: Within  Functional Limits for tasks assessed Orientation Level: Oriented X4 Cognition Arousal/Alertness: Awake/alert Behavior During Therapy: WFL for tasks assessed/performed Overall Cognitive Status: Within Functional Limits for tasks assessed  Blood pressure 136/65, pulse 61, temperature 97.8 F (36.6 C), temperature source Oral, resp. rate 18, height 5\' 10"  (1.778 m), weight 94.3 kg (208 lb), SpO2 91 %. Physical Exam  Vitals reviewed. Constitutional: He is oriented to person, place, and time. He appears well-developed and well-nourished.  HENT:  Head: Normocephalic and atraumatic.  Eyes: Right eye exhibits no discharge. Left eye exhibits no discharge.  Neck: Normal range of motion. Neck supple. No thyromegaly present.  Cardiovascular: Normal rate and regular rhythm.  Respiratory: Effort normal and breath sounds normal. No respiratory distress.  GI: Soft. Bowel sounds are normal. He exhibits no distension.  Musculoskeletal:  No edema or tenderness in extremities  Neurological: He is alert and oriented to person, place, and time.  Mild left facial weakness Motor: Right upper/right lower extremity: 4 -/5 proximal distal Left Upper/left lower extremity: 5/5 proximal to distal Sensation subjectively diminished to light touch right thigh  Skin: Skin is warm and dry.  Psychiatric: He has a normal mood and affect. His behavior is normal.    Results for orders placed or performed during the hospital encounter of 01/26/18 (from the past 24 hour(s))  Ethanol     Status: None   Collection Time: 01/26/18  3:16 PM  Result Value Ref Range   Alcohol, Ethyl (B) <10 <10 mg/dL  CBC     Status: Abnormal   Collection Time: 01/27/18  2:31 AM  Result Value Ref Range   WBC 6.4 4.0 - 10.5 K/uL   RBC 4.49 4.22 - 5.81 MIL/uL   Hemoglobin 15.1 13.0 - 17.0 g/dL   HCT 44.9 39.0 - 52.0 %   MCV 100.0 78.0 - 100.0 fL   MCH 33.6 26.0 - 34.0 pg   MCHC 33.6 30.0 - 36.0 g/dL   RDW 13.3 11.5 - 15.5 %   Platelets  126 (L) 150 - 400 K/uL  Comprehensive metabolic panel     Status: Abnormal   Collection Time: 01/27/18  2:31 AM  Result Value Ref Range   Sodium 139 135 - 145 mmol/L   Potassium 3.5 3.5 - 5.1 mmol/L   Chloride 102 98 - 111 mmol/L   CO2 27 22 - 32 mmol/L   Glucose, Bld 89 70 - 99 mg/dL   BUN 12 6 - 20 mg/dL   Creatinine, Ser 1.08 0.61 - 1.24 mg/dL   Calcium 8.3 (L) 8.9 - 10.3 mg/dL   Total Protein 6.4 (L) 6.5 - 8.1 g/dL   Albumin 2.9 (L) 3.5 - 5.0 g/dL   AST 57 (H) 15 - 41 U/L   ALT 68 (H) 0 - 44 U/L   Alkaline Phosphatase 58 38 - 126 U/L   Total Bilirubin 1.1 0.3 - 1.2 mg/dL   GFR calc non Af Amer >60 >60 mL/min   GFR calc Af Amer >60 >60 mL/min   Anion  gap 10 5 - 15   Ct Angio Head W Or Wo Contrast  Result Date: 01/27/2018 CLINICAL DATA:  Stroke follow-up EXAM: CT ANGIOGRAPHY HEAD AND NECK TECHNIQUE: Multidetector CT imaging of the head and neck was performed using the standard protocol during bolus administration of intravenous contrast. Multiplanar CT image reconstructions and MIPs were obtained to evaluate the vascular anatomy. Carotid stenosis measurements (when applicable) are obtained utilizing NASCET criteria, using the distal internal carotid diameter as the denominator. CONTRAST:  2mL ISOVUE-370 IOPAMIDOL (ISOVUE-370) INJECTION 76% COMPARISON:  Brain MRI 01/26/2018 FINDINGS: CTA NECK FINDINGS AORTIC ARCH: There is minimal calcific atherosclerosis of the aortic arch. There is no aneurysm, dissection or hemodynamically significant stenosis of the visualized ascending aorta and aortic arch. Conventional 3 vessel aortic branching pattern. The visualized proximal subclavian arteries are widely patent. RIGHT CAROTID SYSTEM: --Common carotid artery: Widely patent origin without common carotid artery dissection or aneurysm. --Internal carotid artery: Prominently noncalcified plaque at the carotid bifurcation causes approximately 65% stenosis. --External carotid artery: No acute abnormality.  LEFT CAROTID SYSTEM: --Common carotid artery: Widely patent origin without common carotid artery dissection or aneurysm. --Internal carotid artery:Predominantly noncalcified plaque at the left carotid bifurcation causes approximately 75% stenosis --External carotid artery: No acute abnormality. VERTEBRAL ARTERIES: Left dominant configuration. Both origins are normal. Right vertebral artery terminates in the right PICA. Left vertebral artery is normal to the vertebrobasilar confluence. SKELETON: There is no bony spinal canal stenosis. No lytic or blastic lesion. OTHER NECK: Normal pharynx, larynx and major salivary glands. No cervical lymphadenopathy. Unremarkable thyroid gland. UPPER CHEST: No pneumothorax or pleural effusion. No nodules or masses. CTA HEAD FINDINGS ANTERIOR CIRCULATION: --Intracranial internal carotid arteries: Normal. --Anterior cerebral arteries: Normal. Both A1 segments are present. Patent anterior communicating artery. --Middle cerebral arteries: Normal. --Posterior communicating arteries: Present on the right. POSTERIOR CIRCULATION: --Basilar artery: Normal. --Posterior cerebral arteries: There is multifocal severe stenosis of the right posterior cerebral artery with multiple short segments of occlusion. --Superior cerebellar arteries: Normal. --Inferior cerebellar arteries: Normal anterior and posterior inferior cerebellar arteries. VENOUS SINUSES: Please refer to report for dedicated CT venogram. ANATOMIC VARIANTS: Nondominant right vertebral artery terminates in PICA. DELAYED PHASE: Not performed Review of the MIP images confirms the above findings. IMPRESSION: 1. No emergent large vessel occlusion. 2. Bilateral proximal internal carotid artery stenosis, measuring 65% on the right and 75% on the left. 3. Multifocal severe stenosis of the right PCA with multiple short segment occlusions. 4.  Aortic Atherosclerosis (ICD10-I70.0). Electronically Signed   By: Ulyses Jarred M.D.   On: 01/27/2018  02:14   Ct Head Wo Contrast  Result Date: 01/26/2018 CLINICAL DATA:  CT head w/o contrast due to focal neuro deficit Pt from [facility] via EMS; Last night around 2100 pt started experiencing weakness and numbness to R side; mechanical fall last night, hit head, no LOC, EXAM: CT HEAD WITHOUT CONTRAST TECHNIQUE: Contiguous axial images were obtained from the base of the skull through the vertex without intravenous contrast. COMPARISON:  None. FINDINGS: Brain: No acute intracranial hemorrhage. No focal mass lesion. No CT evidence of acute infarction. No midline shift or mass effect. No hydrocephalus. Basilar cisterns are patent. Subcortical white matter hypodensities in the high frontal lobes. Vascular: No hyperdense vessel or unexpected calcification. Skull: Normal. Negative for fracture or focal lesion. Sinuses/Orbits: Paranasal sinuses and mastoid air cells are clear. Orbits are clear. Other: None. IMPRESSION: 1. No acute intracranial findings. 2. White matter microvascular disease Electronically Signed   By: Nicole Kindred  Leonia Reeves M.D.   On: 01/26/2018 11:48   Ct Angio Neck W Or Wo Contrast  Result Date: 01/27/2018 CLINICAL DATA:  Stroke follow-up EXAM: CT ANGIOGRAPHY HEAD AND NECK TECHNIQUE: Multidetector CT imaging of the head and neck was performed using the standard protocol during bolus administration of intravenous contrast. Multiplanar CT image reconstructions and MIPs were obtained to evaluate the vascular anatomy. Carotid stenosis measurements (when applicable) are obtained utilizing NASCET criteria, using the distal internal carotid diameter as the denominator. CONTRAST:  16mL ISOVUE-370 IOPAMIDOL (ISOVUE-370) INJECTION 76% COMPARISON:  Brain MRI 01/26/2018 FINDINGS: CTA NECK FINDINGS AORTIC ARCH: There is minimal calcific atherosclerosis of the aortic arch. There is no aneurysm, dissection or hemodynamically significant stenosis of the visualized ascending aorta and aortic arch. Conventional 3  vessel aortic branching pattern. The visualized proximal subclavian arteries are widely patent. RIGHT CAROTID SYSTEM: --Common carotid artery: Widely patent origin without common carotid artery dissection or aneurysm. --Internal carotid artery: Prominently noncalcified plaque at the carotid bifurcation causes approximately 65% stenosis. --External carotid artery: No acute abnormality. LEFT CAROTID SYSTEM: --Common carotid artery: Widely patent origin without common carotid artery dissection or aneurysm. --Internal carotid artery:Predominantly noncalcified plaque at the left carotid bifurcation causes approximately 75% stenosis --External carotid artery: No acute abnormality. VERTEBRAL ARTERIES: Left dominant configuration. Both origins are normal. Right vertebral artery terminates in the right PICA. Left vertebral artery is normal to the vertebrobasilar confluence. SKELETON: There is no bony spinal canal stenosis. No lytic or blastic lesion. OTHER NECK: Normal pharynx, larynx and major salivary glands. No cervical lymphadenopathy. Unremarkable thyroid gland. UPPER CHEST: No pneumothorax or pleural effusion. No nodules or masses. CTA HEAD FINDINGS ANTERIOR CIRCULATION: --Intracranial internal carotid arteries: Normal. --Anterior cerebral arteries: Normal. Both A1 segments are present. Patent anterior communicating artery. --Middle cerebral arteries: Normal. --Posterior communicating arteries: Present on the right. POSTERIOR CIRCULATION: --Basilar artery: Normal. --Posterior cerebral arteries: There is multifocal severe stenosis of the right posterior cerebral artery with multiple short segments of occlusion. --Superior cerebellar arteries: Normal. --Inferior cerebellar arteries: Normal anterior and posterior inferior cerebellar arteries. VENOUS SINUSES: Please refer to report for dedicated CT venogram. ANATOMIC VARIANTS: Nondominant right vertebral artery terminates in PICA. DELAYED PHASE: Not performed Review of the  MIP images confirms the above findings. IMPRESSION: 1. No emergent large vessel occlusion. 2. Bilateral proximal internal carotid artery stenosis, measuring 65% on the right and 75% on the left. 3. Multifocal severe stenosis of the right PCA with multiple short segment occlusions. 4.  Aortic Atherosclerosis (ICD10-I70.0). Electronically Signed   By: Ulyses Jarred M.D.   On: 01/27/2018 02:14   Mr Brain Wo Contrast  Result Date: 01/26/2018 CLINICAL DATA:  Acute onset RIGHT extremity weakness beginning yesterday. EXAM: MRI HEAD WITHOUT CONTRAST TECHNIQUE: Axial and coronal diffusion weighted imaging. Patient did not wish to continue examination and study was terminated. COMPARISON:  CT HEAD January 26, 2018 FINDINGS: INTRACRANIAL CONTENTS: Patchy reduced diffusion bilateral frontoparietal lobes with low ADC values on the LEFT, predominantly normalized ADC values on the RIGHT. Reduced diffusion RIGHT occipital lobe with T2 shine through. No midline shift or mass effect. Borderline parenchymal brain volume loss. VASCULAR: Nondiagnostic. SKULL AND UPPER CERVICAL SPINE: No reduced diffusion within the calvarium to suggest hypercellular tumor. SINUSES/ORBITS: Nondiagnostic. OTHER: None. IMPRESSION: 1. Limited 2 sequence MRI head: Acute LEFT frontal infarcts. Subacute RIGHT frontal and biparietal infarcts. Constellation of findings concerning for vasculopathy or embolic phenomena, less likely PRES or venous infarcts. Given limited tolerance of MRI, consider repeat examination with sedation, MRA  versus CTA head and neck. 2. Old RIGHT occipital lobe/PCA territory infarct. 3. Borderline parenchymal brain volume loss. Electronically Signed   By: Elon Alas M.D.   On: 01/26/2018 13:45   Ct Venogram Head  Result Date: 01/27/2018 CLINICAL DATA:  Right-sided numbness and weakness. EXAM: CT VENOGRAM HEAD TECHNIQUE: Following contrast administration, vena graphic images of the intracranial circulation were acquired and  reformatted and coronal and sagittal planes. CONTRAST:  56mL ISOVUE-370 IOPAMIDOL (ISOVUE-370) INJECTION 76% COMPARISON:  None. FINDINGS: Superior sagittal sinus: Normal. Straight sinus: Normal. Inferior sagittal sinus, vein of [place] and internal cerebral veins: Normal. Transverse sinuses: Normal. Sigmoid sinuses: Normal. Visualized jugular veins: Normal. IMPRESSION: Normal venogram of the head. Electronically Signed   By: Ulyses Jarred M.D.   On: 01/27/2018 02:19    Assessment/Plan: Diagnosis: bilateral CVA Labs and images (see above) independently reviewed.  Records reviewed and summated above. Stroke: Continue secondary stroke prophylaxis and Risk Factor Modification listed below:   Antiplatelet therapy:   Blood Pressure Management:  Continue current medication with prn's with permisive HTN per primary team Statin Agent:   Tobacco abuse:   Right sided hemiparesis Motor recovery: Fluoxetine  1. Does the need for close, 24 hr/day medical supervision in concert with the patient's rehab needs make it unreasonable for this patient to be served in a less intensive setting? Potentially  Co-Morbidities requiring supervision/potential complications: alcohol and tobacco abuse (counsel), HTN (monitor and provide prns in accordance with increased physical exertion and pain), IV drug use (counsel), transaminitis (continue to monitor, treat if necessary), Thrombocytopenia (< 60,000/mm3 no resistive exercise) 2. Due to bladder management, safety, skin/wound care, disease management and patient education, does the patient require 24 hr/day rehab nursing? Yes 3. Does the patient require coordinated care of a physician, rehab nurse, PT (1-2 hrs/day, 5 days/week) and OT (1-2 hrs/day, 5 days/week) to address physical and functional deficits in the context of the above medical diagnosis(es)? Yes Addressing deficits in the following areas: balance, endurance, locomotion, strength, transferring, bathing, dressing,  toileting and psychosocial support 4. Can the patient actively participate in an intensive therapy program of at least 3 hrs of therapy per day at least 5 days per week? Yes 5. The potential for patient to make measurable gains while on inpatient rehab is good and fair 6. Anticipated functional outcomes upon discharge from inpatient rehab are supervision  with PT, supervision with OT, n/a with SLP. 7. Estimated rehab length of stay to reach the above functional goals is: 4-7 days. 8. Anticipated D/C setting: Other 9. Anticipated post D/C treatments: HH therapy and Home excercise program 10. Overall Rehab/Functional Prognosis: good  RECOMMENDATIONS: This patient's condition is appropriate for continued rehabilitative care in the following setting: Anticipate patient will be relatively high functioning after completion of medical workup/intervention and will not require CIR. However, will continue to follow and consider CIR if patient does not improve functionally and if stable discharge venue in place. Patient has agreed to participate in recommended program. Potentially Note that insurance prior authorization may be required for reimbursement for recommended care.  Comment: Rehab Admissions Coordinator to follow up.   I have personally performed a face to face diagnostic evaluation, including, but not limited to relevant history and physical exam findings, of this patient and developed relevant assessment and plan.  Additionally, I have reviewed and concur with the physician assistant's documentation above.   Delice Lesch, MD, ABPMR Lavon Paganini Angiulli, PA-C 01/27/2018

## 2018-01-28 LAB — COMPREHENSIVE METABOLIC PANEL
ALT: 74 U/L — AB (ref 0–44)
AST: 61 U/L — AB (ref 15–41)
Albumin: 2.9 g/dL — ABNORMAL LOW (ref 3.5–5.0)
Alkaline Phosphatase: 60 U/L (ref 38–126)
Anion gap: 7 (ref 5–15)
BUN: 11 mg/dL (ref 6–20)
CO2: 27 mmol/L (ref 22–32)
Calcium: 8.4 mg/dL — ABNORMAL LOW (ref 8.9–10.3)
Chloride: 107 mmol/L (ref 98–111)
Creatinine, Ser: 1.12 mg/dL (ref 0.61–1.24)
GFR calc Af Amer: >60 mL/min (ref >60)
GFR calc non Af Amer: >60 mL/min (ref >60)
Glucose, Bld: 95 mg/dL (ref 70–99)
Potassium: 4 mmol/L (ref 3.5–5.1)
SODIUM: 141 mmol/L (ref 135–145)
Total Bilirubin: 1 mg/dL (ref 0.3–1.2)
Total Protein: 6.6 g/dL (ref 6.5–8.1)

## 2018-01-28 LAB — CBC
HCT: 46.1 % (ref 39.0–52.0)
Hemoglobin: 15.5 g/dL (ref 13.0–17.0)
MCH: 33.4 pg (ref 26.0–34.0)
MCHC: 33.6 g/dL (ref 30.0–36.0)
MCV: 99.4 fL (ref 78.0–100.0)
Platelets: 130 10*3/uL — ABNORMAL LOW (ref 150–400)
RBC: 4.64 MIL/uL (ref 4.22–5.81)
RDW: 13.2 % (ref 11.5–15.5)
WBC: 7.3 10*3/uL (ref 4.0–10.5)

## 2018-01-28 LAB — MAGNESIUM: Magnesium: 2.1 mg/dL (ref 1.7–2.4)

## 2018-01-28 NOTE — Progress Notes (Signed)
  Progress Note    01/28/2018 3:08 PM * No surgery found *  Subjective:  No new issues feels like right arm strength is progressing  Vitals:   01/28/18 0742 01/28/18 1239  BP: (!) 161/84 (!) 143/70  Pulse: 60 63  Resp: 18 18  Temp: 98 F (36.7 C) 98.2 F (36.8 C)  SpO2: 96% 94%    Physical Exam: Awake alert oriented Right upper extremity strength 4-5 Bilateral lower extremities equal strength  CBC    Component Value Date/Time   WBC 7.3 01/28/2018 0006   RBC 4.64 01/28/2018 0006   HGB 15.5 01/28/2018 0006   HCT 46.1 01/28/2018 0006   PLT 130 (L) 01/28/2018 0006   MCV 99.4 01/28/2018 0006   MCH 33.4 01/28/2018 0006   MCHC 33.6 01/28/2018 0006   RDW 13.2 01/28/2018 0006   LYMPHSABS 1.4 01/26/2018 1102   MONOABS 0.9 01/26/2018 1102   EOSABS 0.2 01/26/2018 1102   BASOSABS 0.0 01/26/2018 1102    BMET    Component Value Date/Time   NA 141 01/28/2018 0006   K 4.0 01/28/2018 0006   CL 107 01/28/2018 0006   CO2 27 01/28/2018 0006   GLUCOSE 95 01/28/2018 0006   BUN 11 01/28/2018 0006   CREATININE 1.12 01/28/2018 0006   CALCIUM 8.4 (L) 01/28/2018 0006   GFRNONAA >60 01/28/2018 0006   GFRAA >60 01/28/2018 0006    INR    Component Value Date/Time   INR 1.11 01/26/2018 1102     Intake/Output Summary (Last 24 hours) at 01/28/2018 1508 Last data filed at 01/28/2018 1240 Gross per 24 hour  Intake 2120.4 ml  Output 1390 ml  Net 730.4 ml     Assessment:  57 y.o. male is here with high-grade bilateral carotid artery stenosis with recent left-sided stroke.  Plan: Okay for discharge from vascular standpoint Planning left carotid endarterectomy on August 20. He should continue doing the platelet therapy to the time of surgery.  Dhani Dannemiller C. Donzetta Matters, MD [facility] [place] Office: [phone] Pager: [phone]  01/28/2018 3:08 PM

## 2018-01-28 NOTE — Progress Notes (Signed)
Physical Therapy Treatment Patient Details Name: Brian Huynh MRN: [id number] DOB: [birth year] Today's Date: 01/28/2018    History of Present Illness Brian Huynh is 57 y.o. 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 PMH includes HepC, Jaundice, substance abuse, and alcohol abuse.      PT Comments    Patient is pleasant and eager to participate in therapy. Pt is making progress toward PT goals and able to increased gait distance. Pt requires assistance for balance with all OOB mobility and at least single UE support. Pt continues to present with R side weakness and decreased sensation and limited mobility of R UE. If pt is not eligible for CIR he will need SNF. Pt is at risk for falls and will continue to benefit from further skilled PT services to maximize independence and safety with mobility.     Follow Up Recommendations  CIR     Equipment Recommendations  None recommended by PT    Recommendations for Other Services OT consult     Precautions / Restrictions Precautions Precautions: Fall Restrictions Weight Bearing Restrictions: No    Mobility  Bed Mobility Overal bed mobility: Needs Assistance Bed Mobility: Supine to Sit     Supine to sit: Supervision     General bed mobility comments: supervision for safety; cues for positioning of R UE  Transfers Overall transfer level: Needs assistance Equipment used: None Transfers: Sit to/from Stand Sit to Stand: Min assist;Min guard         General transfer comment: assist to steady  Ambulation/Gait Ambulation/Gait assistance: Min assist Gait Distance (Feet): (75 ft X2 with seated rest break) Assistive device: Straight cane Gait Pattern/deviations: Step-to pattern;Decreased step length - left;Decreased dorsiflexion - right;Step-through pattern;Decreased step length - right;Drifts right/left Gait velocity: Decreased   General Gait Details: assistance for balance  and cues for sequencing and increased bilat step lengths; pt with improved R LE control noted   Stairs             Wheelchair Mobility    Modified Rankin (Stroke Patients Only)       Balance Overall balance assessment: Needs assistance Sitting-balance support: No upper extremity supported;Feet unsupported Sitting balance-Leahy Scale: Fair     Standing balance support: Single extremity supported;During functional activity Standing balance-Leahy Scale: Poor                              Cognition Arousal/Alertness: Awake/alert Behavior During Therapy: WFL for tasks assessed/performed Overall Cognitive Status: Within Functional Limits for tasks assessed                                        Exercises      General Comments        Pertinent Vitals/Pain Pain Assessment: No/denies pain Pain Intervention(s): Monitored during session    Home Living                      Prior Function            PT Goals (current goals can now be found in the care plan section) Acute Rehab PT Goals Patient Stated Goal: to get stronger Progress towards PT goals: Progressing toward goals    Frequency    Min 4X/week      PT Plan Current plan remains  appropriate    Co-evaluation              AM-PAC PT "6 Clicks" Daily Activity  Outcome Measure  Difficulty turning over in bed (including adjusting bedclothes, sheets and blankets)?: A Little Difficulty moving from lying on back to sitting on the side of the bed? : A Little Difficulty sitting down on and standing up from a chair with arms (e.g., wheelchair, bedside commode, etc,.)?: A Lot Help needed moving to and from a bed to chair (including a wheelchair)?: A Little Help needed walking in hospital room?: A Little Help needed climbing 3-5 steps with a railing? : A Lot 6 Click Score: 16    End of Session Equipment Utilized During Treatment: Gait belt Activity Tolerance:  Patient tolerated treatment well Patient left: with call bell/phone within reach;in bed Nurse Communication: Mobility status PT Visit Diagnosis: Unsteadiness on feet (R26.81);Other abnormalities of gait and mobility (R26.89);Muscle weakness (generalized) (M62.81);History of falling (Z91.81);Difficulty in walking, not elsewhere classified (R26.2);Other symptoms and signs involving the nervous system (R29.898);Pain;Hemiplegia and hemiparesis Hemiplegia - Right/Left: Right Hemiplegia - dominant/non-dominant: Dominant Hemiplegia - caused by: Cerebral infarction     Time: 7588-3254 PT Time Calculation (min) (ACUTE ONLY): 31 min  Charges:  $Gait Training: 23-37 mins                     [name], PTA Pager: [phone]     [name] 01/28/2018, 2:40 PM

## 2018-01-28 NOTE — Progress Notes (Addendum)
Inpatient Rehabilitation-Admissions Coordinator   Noted pt has VA insurance and has been approved for a rehab stay at a contracted [facility] SNF. Discussed with pt that CIR would  be self pay and pt has declined due to financial reasons. AC will sign off at this time. Please call if questions.   Jhonnie Garner, OTR/L  Rehab Admissions Coordinator  [phone] 01/28/2018 12:00 PM

## 2018-01-28 NOTE — Progress Notes (Signed)
CM received a call back from SW from [facility] yesterday that Brian Huynh sees Dr Harrell Lark at the [facility]. His SW through them is Eyvonne Left: [phone] or [phone]).  Also received information this am from [facility] that patient has been approved for 32 days at a contracted [facility] SNF for rehab.  CSW updated and CM following for further needs.

## 2018-01-28 NOTE — Progress Notes (Signed)
CSW following for discharge plan. CSW contacted the following [facility] facilities for SNF placement:  -[facility]: no beds available -[facility]: No beds available -[facility]: Janett Billow in Admissions was not available, left a voicemail to check on beds -[facility]: sent referral, they are unable to offer a bed -[facility]: sent referral for review, awaiting decision  No beds available at the [facility] SNF at this time, either. No contract bed available at this time. CSW to continue to follow.  Laveda Abbe, [facility] Clinical Social Worker [phone]

## 2018-01-28 NOTE — Progress Notes (Signed)
PROGRESS NOTE  Brian Huynh BJS:[id number] DOB: [birth year] DOA: 01/26/2018 PCP: [facility]  HPI/Recap of past 24 hours:   No interval changes Feeling better, right sided weakness is improving Denies headache Reports some chronic intermittent seeing floaters Reports chronic back pain and restless leg  Assessment/Plan: Active Problems:   CVA (cerebral vascular accident) ([place])   Tobacco abuse   Essential hypertension   IV drug abuse ([place])   Thrombocytopenia ([place])  Stroke:  bilateral MCA/ACA watershed, right MCA/PCA watershed and b/l ACA patchy infarct, likely due to hypoperfusion from b/l high grade ICA stenosis. Neurology input appreciated, DAPT with asa 325mg  and plavix75mg  daily for three months then single agent Vascular surgery input appreciated, plan to have surgery in two weeks snf placement  Elevation of lft From alcohol use? Prior h/o hep c, spontaneuously resolved per patient Will repeat hepatitis panel   Mild thrombocytopenia From alcohol? Improving, monitor  Polysubstance abuse + alcohol + cigarettes  reports recent use of heroin uds on presentation negative  I have reviewed tele strips, has been stable, will d/c tele D/c ivf  Code Status: full  Family Communication: patient   Disposition Plan: snf ( [facility] insurance)   Consultants:  Neurology  Vascular surgery  Procedures:  none  Antibiotics:  none   Objective: BP (!) 143/70 (BP Location: Right Arm)   Pulse 63   Temp 98.2 F (36.8 C) (Oral)   Resp 18   Ht 5\' 10"  (1.778 m)   Wt 94.3 kg   SpO2 94%   BMI 29.84 kg/m   Intake/Output Summary (Last 24 hours) at 01/28/2018 1452 Last data filed at 01/28/2018 1240 Gross per 24 hour  Intake 2120.4 ml  Output 1390 ml  Net 730.4 ml   Filed Weights   01/26/18 1053  Weight: 94.3 kg    Exam: Patient is examined daily including today on 01/28/2018, exams remain the same as of yesterday except that has changed    General:   NAD  Cardiovascular: RRR  Respiratory: CTABL  Abdomen: Soft/ND/NT, positive BS  Musculoskeletal: No Edema  Neuro: alert, oriented , right sided weakness is improving  Data Reviewed: Basic Metabolic Panel: Recent Labs  Lab 01/26/18 1102 01/26/18 1109 01/27/18 0231 01/28/18 0006  NA 138 138 139 141  K 4.2 4.0 3.5 4.0  CL 102 102 102 107  CO2 25  --  27 27  GLUCOSE 101* 100* 89 95  BUN 9 11 12 11   CREATININE 1.16 1.10 1.08 1.12  CALCIUM 8.8*  --  8.3* 8.4*  MG  --   --   --  2.1   Liver Function Tests: Recent Labs  Lab 01/26/18 1102 01/27/18 0231 01/28/18 0006  AST 70* 57* 61*  ALT 78* 68* 74*  ALKPHOS 64 58 60  BILITOT 1.9* 1.1 1.0  PROT 7.3 6.4* 6.6  ALBUMIN 3.3* 2.9* 2.9*   No results for input(s): LIPASE, AMYLASE in the last 168 hours. No results for input(s): AMMONIA in the last 168 hours. CBC: Recent Labs  Lab 01/26/18 1102 01/26/18 1109 01/27/18 0231 01/28/18 0006  WBC 10.4  --  6.4 7.3  NEUTROABS 7.8*  --   --   --   HGB 16.5 16.7 15.1 15.5  HCT 48.0 49.0 44.9 46.1  MCV 97.6  --  100.0 99.4  PLT 135*  --  126* 130*   Cardiac Enzymes:   No results for input(s): CKTOTAL, CKMB, CKMBINDEX, TROPONINI in the last 168 hours. BNP (last 3  results) No results for input(s): BNP in the last 8760 hours.  ProBNP (last 3 results) No results for input(s): PROBNP in the last 8760 hours.  CBG: No results for input(s): GLUCAP in the last 168 hours.  Recent Results (from the past 240 hour(s))  Culture, blood (routine x 2)     Status: None (Preliminary result)   Collection Time: 01/26/18  7:15 PM  Result Value Ref Range Status   Specimen Description BLOOD RIGHT THUMB  Final   Special Requests   Final    BOTTLES DRAWN AEROBIC AND ANAEROBIC Blood Culture adequate volume   Culture   Final    NO GROWTH 2 DAYS Performed at [facility], [address] [place], [place] [address]    Report Status PENDING  Incomplete  Culture, blood (routine x 2)      Status: None (Preliminary result)   Collection Time: 01/26/18  7:20 PM  Result Value Ref Range Status   Specimen Description BLOOD RIGHT WRIST  Final   Special Requests   Final    BOTTLES DRAWN AEROBIC ONLY Blood Culture results may not be optimal due to an inadequate volume of blood received in culture bottles   Culture   Final    NO GROWTH 2 DAYS Performed at [facility], [place] [address], [place], [place] [address]    Report Status PENDING  Incomplete     Studies: No results found.  Scheduled Meds: . aspirin  325 mg Oral Daily  . atorvastatin  80 mg Oral q1800  . citalopram  40 mg Oral Daily  . clopidogrel  75 mg Oral Daily  . enoxaparin (LOVENOX) injection  40 mg Subcutaneous Q24H  . folic acid  1 mg Oral Daily  . multivitamin with minerals  1 tablet Oral Daily  . nicotine  21 mg Transdermal Daily  . thiamine  100 mg Oral Daily   Or  . thiamine  100 mg Intravenous Daily  . traZODone  100 mg Oral QHS    Continuous Infusions: . sodium chloride 75 mL/hr at 01/28/18 0272     Time spent: 34mins I have personally reviewed and interpreted on  01/28/2018 daily labs, tele strips, imagings as discussed above under date review session and assessment and plans.  I reviewed all nursing notes, pharmacy notes, consultant notes,  vitals, pertinent old records  I have discussed plan of care as described above with RN , patient on 01/28/2018   Florencia Reasons MD, PhD  [facility] Pager [phone]. If 7PM-7AM, please contact night-coverage at [email or web address], password [facility] 01/28/2018, 2:52 PM  LOS: 2 days

## 2018-01-29 LAB — CBC
HEMATOCRIT: 50.1 % (ref 39.0–52.0)
HEMOGLOBIN: 16.9 g/dL (ref 13.0–17.0)
MCH: 33.3 pg (ref 26.0–34.0)
MCHC: 33.7 g/dL (ref 30.0–36.0)
MCV: 98.8 fL (ref 78.0–100.0)
Platelets: 131 10*3/uL — ABNORMAL LOW (ref 150–400)
RBC: 5.07 MIL/uL (ref 4.22–5.81)
RDW: 13.2 % (ref 11.5–15.5)
WBC: 6.9 10*3/uL (ref 4.0–10.5)

## 2018-01-29 LAB — BASIC METABOLIC PANEL
Anion gap: 8 (ref 5–15)
BUN: 11 mg/dL (ref 6–20)
CHLORIDE: 105 mmol/L (ref 98–111)
CO2: 28 mmol/L (ref 22–32)
Calcium: 8.9 mg/dL (ref 8.9–10.3)
Creatinine, Ser: 1.24 mg/dL (ref 0.61–1.24)
GFR calc Af Amer: >60 mL/min (ref >60)
GFR calc non Af Amer: >60 mL/min (ref >60)
Glucose, Bld: 92 mg/dL (ref 70–99)
POTASSIUM: 4.1 mmol/L (ref 3.5–5.1)
SODIUM: 141 mmol/L (ref 135–145)

## 2018-01-29 LAB — HEPATIC FUNCTION PANEL
ALBUMIN: 3.3 g/dL — AB (ref 3.5–5.0)
ALK PHOS: 65 U/L (ref 38–126)
ALT: 89 U/L — AB (ref 0–44)
AST: 75 U/L — ABNORMAL HIGH (ref 15–41)
Bilirubin, Direct: 0.3 mg/dL — ABNORMAL HIGH (ref 0.0–0.2)
Indirect Bilirubin: 0.8 mg/dL (ref 0.3–0.9)
TOTAL PROTEIN: 7.5 g/dL (ref 6.5–8.1)
Total Bilirubin: 1.1 mg/dL (ref 0.3–1.2)

## 2018-01-29 LAB — HCV RNA QUANT
HCV Quantitative Log: 5.775 log10 IU/mL (ref >1.70)
HCV Quantitative: 596000 IU/mL (ref >50)

## 2018-01-29 LAB — HEPATITIS PANEL, ACUTE
HEP A IGM: NEGATIVE
HEP B C IGM: NEGATIVE
HEP B S AG: NEGATIVE

## 2018-01-29 MED ORDER — TRAZODONE HCL 50 MG PO TABS
50.0000 mg | ORAL_TABLET | Freq: Every day | ORAL | Status: DC
  Administered 2018-01-29 – 2018-02-02 (×5): 50 mg via ORAL
  Filled 2018-01-29 (×5): qty 1

## 2018-01-29 MED ORDER — GABAPENTIN 100 MG PO CAPS
100.0000 mg | ORAL_CAPSULE | Freq: Every day | ORAL | Status: DC
  Administered 2018-01-29 – 2018-01-30 (×2): 100 mg via ORAL
  Filled 2018-01-29 (×2): qty 1

## 2018-01-29 NOTE — Progress Notes (Signed)
PROGRESS NOTE  Brian Huynh BUL:[id number] DOB: 02-Jun-1961 DOA: 01/26/2018 PCP: [facility]  HPI/Recap of past 24 hours:   No interval changes Feeling better, right sided weakness is improving Denies headache Reports some chronic intermittent seeing floaters Reports chronic back pain and restless leg He asked for ativan at night for sleep, he reports ativan also helps with restless leg at night  Assessment/Plan: Active Problems:   CVA (cerebral vascular accident) ([place])   Tobacco abuse   Essential hypertension   IV drug abuse ([place])   Thrombocytopenia ([place])  Stroke:  bilateral MCA/ACA watershed, right MCA/PCA watershed and b/l ACA patchy infarct, likely due to hypoperfusion from b/l high grade ICA stenosis. Neurology input appreciated, DAPT with asa 325mg  and plavix75mg  daily for three months then single agent Vascular surgery input appreciated, plan to have surgery in two weeks snf placement  Elevation of lft/ hepatitic C From alcohol use? 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 Improving, monitor  Polysubstance abuse + alcohol + cigarettes  +reports recent use of heroin uds on presentation negative  Restless leg, insomnia Will start low dose neurontin nightly, titrate dose   Code Status: full  Family Communication: patient   Disposition Plan: snf [facility])   Consultants:  Neurology  Vascular surgery  Procedures:  none  Antibiotics:  none   Objective: BP (!) 168/89 (BP Location: Left Arm)   Pulse (!) 57   Temp 97.7 F (36.5 C) (Oral)   Resp 17   Ht 5\' 10"  (1.778 m)   Wt 94.3 kg   SpO2 96%   BMI 29.84 kg/m   Intake/Output Summary (Last 24 hours) at 01/29/2018 1718 Last data filed at  01/29/2018 1123 Gross per 24 hour  Intake -  Output 850 ml  Net -850 ml   Filed Weights   01/26/18 1053  Weight: 94.3 kg    Exam: Patient is examined daily including today on 01/29/2018, exams remain the same as of yesterday except that has changed    General:  NAD  Cardiovascular: RRR  Respiratory: CTABL  Abdomen: Soft/ND/NT, positive BS  Musculoskeletal: No Edema  Neuro: alert, oriented , right sided weakness is improving  Data Reviewed: Basic Metabolic Panel: Recent Labs  Lab 01/26/18 1102 01/26/18 1109 01/27/18 0231 01/28/18 0006 01/29/18 0922  NA 138 138 139 141 141  K 4.2 4.0 3.5 4.0 4.1  CL 102 102 102 107 105  CO2 25  --  27 27 28   GLUCOSE 101* 100* 89 95 92  BUN 9 11 12 11 11   CREATININE 1.16 1.10 1.08 1.12 1.24  CALCIUM 8.8*  --  8.3* 8.4* 8.9  MG  --   --   --  2.1  --    Liver Function Tests: Recent Labs  Lab 01/26/18 1102 01/27/18 0231 01/28/18 0006 01/29/18 0922  AST 70* 57* 61* 75*  ALT 78* 68* 74* 89*  ALKPHOS 64 58 60 65  BILITOT 1.9* 1.1 1.0 1.1  PROT 7.3 6.4* 6.6 7.5  ALBUMIN 3.3* 2.9* 2.9* 3.3*   No results for input(s): LIPASE, AMYLASE in the last 168 hours. No results for input(s): AMMONIA in the last 168 hours. CBC: Recent Labs  Lab 01/26/18 1102 01/26/18 1109 01/27/18 0231 01/28/18 0006 01/29/18 0922  WBC  10.4  --  6.4 7.3 6.9  NEUTROABS 7.8*  --   --   --   --   HGB 16.5 16.7 15.1 15.5 16.9  HCT 48.0 49.0 44.9 46.1 50.1  MCV 97.6  --  100.0 99.4 98.8  PLT 135*  --  126* 130* 131*   Cardiac Enzymes:   No results for input(s): CKTOTAL, CKMB, CKMBINDEX, TROPONINI in the last 168 hours. BNP (last 3 results) No results for input(s): BNP in the last 8760 hours.  ProBNP (last 3 results) No results for input(s): PROBNP in the last 8760 hours.  CBG: No results for input(s): GLUCAP in the last 168 hours.  Recent Results (from the past 240 hour(s))  Culture, blood (routine x 2)     Status: None (Preliminary result)    Collection Time: 01/26/18  7:15 PM  Result Value Ref Range Status   Specimen Description BLOOD RIGHT THUMB  Final   Special Requests   Final    BOTTLES DRAWN AEROBIC AND ANAEROBIC Blood Culture adequate volume   Culture   Final    NO GROWTH 3 DAYS Performed at [facility], [address] [place], [place] [address]    Report Status PENDING  Incomplete  Culture, blood (routine x 2)     Status: None (Preliminary result)   Collection Time: 01/26/18  7:20 PM  Result Value Ref Range Status   Specimen Description BLOOD RIGHT WRIST  Final   Special Requests   Final    BOTTLES DRAWN AEROBIC ONLY Blood Culture results may not be optimal due to an inadequate volume of blood received in culture bottles   Culture   Final    NO GROWTH 3 DAYS Performed at [facility], [place] [address] [place], [place] [address]    Report Status PENDING  Incomplete     Studies: No results found.  Scheduled Meds: . aspirin  325 mg Oral Daily  . atorvastatin  80 mg Oral q1800  . citalopram  40 mg Oral Daily  . clopidogrel  75 mg Oral Daily  . enoxaparin (LOVENOX) injection  40 mg Subcutaneous Q24H  . folic acid  1 mg Oral Daily  . gabapentin  100 mg Oral QHS  . multivitamin with minerals  1 tablet Oral Daily  . nicotine  21 mg Transdermal Daily  . thiamine  100 mg Oral Daily   Or  . thiamine  100 mg Intravenous Daily  . traZODone  50 mg Oral QHS    Continuous Infusions:    Time spent: 39mins I have personally reviewed and interpreted on  01/29/2018 daily labs, tele strips, imagings as discussed above under date review session and assessment and plans.  I reviewed all nursing notes, pharmacy notes, consultant notes,  vitals, pertinent old records  I have discussed plan of care as described above with RN , patient on 01/29/2018   Florencia Reasons MD, PhD  [facility] Pager [phone]. If 7PM-7AM, please contact night-coverage at [email or web address], password [facility] 01/29/2018, 5:18 PM  LOS: 3 days

## 2018-01-29 NOTE — Progress Notes (Signed)
Physical Therapy Treatment Patient Details Name: Brian Huynh MRN: [id number] DOB: 05/07/1961 Today's Date: 01/29/2018    History of Present Illness Brian Huynh is 57 y.o. 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 PMH includes HepC, Jaundice, substance abuse, and alcohol abuse.      PT Comments    Patient continues to make gradual progress toward PT goals. Continue to progress as tolerated.    Follow Up Recommendations  CIR     Equipment Recommendations  None recommended by PT    Recommendations for Other Services OT consult     Precautions / Restrictions Precautions Precautions: Fall    Mobility  Bed Mobility Overal bed mobility: Needs Assistance Bed Mobility: Supine to Sit     Supine to sit: Supervision     General bed mobility comments: supervision for safety  Transfers Overall transfer level: Needs assistance Equipment used: None Transfers: Sit to/from Stand Sit to Stand: Min guard         General transfer comment: for safety  Ambulation/Gait Ambulation/Gait assistance: Min assist Gait Distance (Feet): 120 Feet Assistive device: Straight cane Gait Pattern/deviations: Decreased step length - left;Decreased dorsiflexion - right;Step-through pattern;Decreased step length - right;Drifts right/left Gait velocity: Decreased   General Gait Details: assist to steady; pt with improved R step length and foot clearance noted; standing break required due to increased dizziness    Stairs             Wheelchair Mobility    Modified Rankin (Stroke Patients Only) Modified Rankin (Stroke Patients Only) Modified Rankin: Moderately severe disability     Balance Overall balance assessment: Needs assistance Sitting-balance support: No upper extremity supported;Feet unsupported Sitting balance-Leahy Scale: Fair     Standing balance support: Single extremity supported;During functional  activity Standing balance-Leahy Scale: Poor                              Cognition Arousal/Alertness: Awake/alert Behavior During Therapy: WFL for tasks assessed/performed Overall Cognitive Status: Within Functional Limits for tasks assessed                                        Exercises Shoulder Exercises Shoulder ABduction: AROM;Right;10 reps Elbow Flexion: AROM;Right;10 reps;Standing(wach cloth on wall)    General Comments        Pertinent Vitals/Pain Pain Assessment: No/denies pain    Home Living                      Prior Function            PT Goals (current goals can now be found in the care plan section) Progress towards PT goals: Progressing toward goals    Frequency    Min 4X/week      PT Plan Current plan remains appropriate    Co-evaluation              AM-PAC PT "6 Clicks" Daily Activity  Outcome Measure  Difficulty turning over in bed (including adjusting bedclothes, sheets and blankets)?: None Difficulty moving from lying on back to sitting on the side of the bed? : A Little Difficulty sitting down on and standing up from a chair with arms (e.g., wheelchair, bedside commode, etc,.)?: A Little Help needed moving to and from a bed to chair (including a  wheelchair)?: A Little Help needed walking in hospital room?: A Little Help needed climbing 3-5 steps with a railing? : A Lot 6 Click Score: 18    End of Session Equipment Utilized During Treatment: Gait belt Activity Tolerance: Patient tolerated treatment well Patient left: with call bell/phone within reach;in chair;with chair alarm set Nurse Communication: Mobility status PT Visit Diagnosis: Unsteadiness on feet (R26.81);Other abnormalities of gait and mobility (R26.89);Muscle weakness (generalized) (M62.81);History of falling (Z91.81);Difficulty in walking, not elsewhere classified (R26.2);Other symptoms and signs involving the nervous system  (R29.898);Pain;Hemiplegia and hemiparesis Hemiplegia - Right/Left: Right Hemiplegia - dominant/non-dominant: Dominant Hemiplegia - caused by: Cerebral infarction     Time: 3735-7897 PT Time Calculation (min) (ACUTE ONLY): 24 min  Charges:  $Gait Training: 8-22 mins $Therapeutic Exercise: 8-22 mins                     [name], PTA Pager: [phone]     [name] 01/29/2018, 3:44 PM

## 2018-01-29 NOTE — Progress Notes (Signed)
CSW following for discharge plan. CSW checked in earlier today to confirm that [facility] received referral; referral was currently under clinical review. CSW received no call back about decision today.  CSW to check back in on Monday with [facility]. No other [facility] contract SNF has any beds available at this time.  CSW to follow.  Laveda Abbe, [place] Clinical Social Worker [phone]

## 2018-01-30 DIAGNOSIS — B1920 Unspecified viral hepatitis C without hepatic coma: Secondary | ICD-10-CM

## 2018-01-30 MED ORDER — SENNOSIDES-DOCUSATE SODIUM 8.6-50 MG PO TABS
1.0000 | ORAL_TABLET | Freq: Every day | ORAL | Status: AC
  Administered 2018-01-31: 1 via ORAL
  Filled 2018-01-30 (×2): qty 1

## 2018-01-30 NOTE — Progress Notes (Signed)
Occupational Therapy Treatment Patient Details Name: Brian Huynh MRN: [id number] DOB: 01/06/1961 Today's Date: 01/30/2018    History of present illness Brian Huynh is 57 y.o. 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 PMH includes HepC, Jaundice, substance abuse, and alcohol abuse.     OT comments  Pt. Educated on and provided with built up utensil foam for increasing independence and accuracy with self feeding. Also reviewed fine motor activities to promote function of R ue.  Pt. Able to complete bed mobility, LB dressing, and stand pivot to recliner.  Will cont. With RUE HEP at next session.   Follow Up Recommendations  CIR;Supervision/Assistance - 24 hour    Equipment Recommendations  Other (comment)    Recommendations for Other Services Rehab consult    Precautions / Restrictions Precautions Precautions: Fall       Mobility Bed Mobility Overal bed mobility: Needs Assistance Bed Mobility: Supine to Sit     Supine to sit: Supervision        Transfers Overall transfer level: Needs assistance Equipment used: None Transfers: Sit to/from Stand;Stand Pivot Transfers Sit to Stand: Min guard Stand pivot transfers: Min guard            Balance                                           ADL either performed or assessed with clinical judgement   ADL Overall ADL's : Needs assistance/impaired Eating/Feeding: Minimal assistance Eating/Feeding Details (indicate cue type and reason): pt. reports he is L hand dominant, able to eat without assist per his report. some residual weakness R ue. red foam provided with demo for use with knife/cutting R hand.  pt. also able to return demo of use of both hands to drink from cup with lid and straw for extra support                 Lower Body Dressing: Set up;Sitting/lateral leans Lower Body Dressing Details (indicate cue type and reason): to don socks,  slow but able to complete without assistance. states he feels residual dizziness that makes him slower for completion of this task Toilet Transfer: Min guard;Minimal assistance;Ambulation Toilet Transfer Details (indicate cue type and reason): min guard assist for balance and safety, simulated with in room transfer from eob to recliner         Functional mobility during ADLs: Min guard;Minimal assistance       Vision       Perception     Praxis      Cognition Arousal/Alertness: Awake/alert Behavior During Therapy: WFL for tasks assessed/performed Overall Cognitive Status: Within Functional Limits for tasks assessed                                          Exercises Other Exercises Other Exercises: reviewed examples of fine motor tasks to engage R hand and promote controlled function ie: turning pages in magazine, phone use (texting)   Shoulder Instructions       General Comments      Pertinent Vitals/ Pain       Pain Assessment: No/denies pain  Home Living  Prior Functioning/Environment              Frequency  Min 3X/week        Progress Toward Goals  OT Goals(current goals can now be found in the care plan section)  Progress towards OT goals: Progressing toward goals     Plan      Co-evaluation                 AM-PAC PT "6 Clicks" Daily Activity     Outcome Measure   Help from another person eating meals?: A Lot Help from another person taking care of personal grooming?: A Lot Help from another person toileting, which includes using toliet, bedpan, or urinal?: A Little Help from another person bathing (including washing, rinsing, drying)?: A Little Help from another person to put on and taking off regular upper body clothing?: A Little Help from another person to put on and taking off regular lower body clothing?: A Little 6 Click Score: 16    End of Session     OT Visit Diagnosis: Unsteadiness on feet (R26.81);Other abnormalities of gait and mobility (R26.89);Muscle weakness (generalized) (M62.81);Other symptoms and signs involving the nervous system (R29.898);Hemiplegia and hemiparesis Hemiplegia - Right/Left: Right Hemiplegia - dominant/non-dominant: Non-Dominant Hemiplegia - caused by: Cerebral infarction   Activity Tolerance Patient tolerated treatment well   Patient Left in chair;with call bell/phone within reach;with chair alarm set   Nurse Communication          Time: 1025-1036 OT Time Calculation (min): 11 min  Charges: OT General Charges $OT Visit: 1 Visit OT Treatments $Self Care/Home Management : 8-22 mins  Janice Coffin, COTA/L 01/30/2018, 10:42 AM

## 2018-01-30 NOTE — Plan of Care (Signed)
Brian Huynh has been calm, cooperative and pleasant.  His demeanor suggests he is genuinely interested and thankful for his care.  Although he has rested most of the day, he expresses no pain and feels "improved" function in his right side.    Nursing POC:  plan for D/C to [facility] facility, MNIHSS Q4, pain management, supporting lifestyle changes to minimize further health complications.

## 2018-01-30 NOTE — Progress Notes (Signed)
PROGRESS NOTE  Brian Huynh TGG:[id number] DOB: [birth year] DOA: 01/26/2018 PCP: [facility]  HPI/Recap of past 24 hours:  Slept well,  No bm for three days Continue to improve    Assessment/Plan: Active Problems:   CVA (cerebral vascular accident) ([place])   Tobacco abuse   Essential hypertension   IV drug abuse ([place])   Thrombocytopenia ([place])  Stroke:  bilateral MCA/ACA watershed, right MCA/PCA watershed and b/l ACA patchy infarct, likely due to hypoperfusion from b/l high grade ICA stenosis. Neurology input appreciated, DAPT with asa 325mg  and plavix75mg  daily for three months then single agent Vascular surgery input appreciated, plan to have surgery in two weeks snf placement  Elevation of lft/ hepatitic C From alcohol use? 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 Improving, monitor  Polysubstance abuse + alcohol + cigarettes  +reports recent use of heroin uds on presentation negative  Restless leg, insomnia Will start low dose neurontin nightly, titrate dose   Code Status: full  Family Communication: patient   Disposition Plan: snf [facility])   Consultants:  Neurology  Vascular surgery  Procedures:  none  Antibiotics:  none   Objective: BP 140/88 (BP Location: Right Arm)   Pulse 63   Temp 98.5 F (36.9 C) (Oral)   Resp 17   Ht 5\' 10"  (1.778 m)   Wt 94.3 kg   SpO2 97%   BMI 29.84 kg/m   Intake/Output Summary (Last 24 hours) at 01/30/2018 1039 Last data filed at 01/30/2018 7035 Gross per 24 hour  Intake 420 ml  Output 950 ml  Net -530 ml   Filed Weights   01/26/18 1053  Weight: 94.3 kg    Exam: Patient is examined daily including today on 01/30/2018, exams remain the same as of  yesterday except that has changed    General:  NAD  Cardiovascular: RRR  Respiratory: CTABL  Abdomen: Soft/ND/NT, positive BS  Musculoskeletal: No Edema  Neuro: alert, oriented , right sided weakness is improving  Data Reviewed: Basic Metabolic Panel: Recent Labs  Lab 01/26/18 1102 01/26/18 1109 01/27/18 0231 01/28/18 0006 01/29/18 0922  NA 138 138 139 141 141  K 4.2 4.0 3.5 4.0 4.1  CL 102 102 102 107 105  CO2 25  --  27 27 28   GLUCOSE 101* 100* 89 95 92  BUN 9 11 12 11 11   CREATININE 1.16 1.10 1.08 1.12 1.24  CALCIUM 8.8*  --  8.3* 8.4* 8.9  MG  --   --   --  2.1  --    Liver Function Tests: Recent Labs  Lab 01/26/18 1102 01/27/18 0231 01/28/18 0006 01/29/18 0922  AST 70* 57* 61* 75*  ALT 78* 68* 74* 89*  ALKPHOS 64 58 60 65  BILITOT 1.9* 1.1 1.0 1.1  PROT 7.3 6.4* 6.6 7.5  ALBUMIN 3.3* 2.9* 2.9* 3.3*   No results for input(s): LIPASE, AMYLASE in the last 168 hours. No results for input(s): AMMONIA in the last 168 hours. CBC: Recent Labs  Lab 01/26/18 1102 01/26/18 1109 01/27/18 0231 01/28/18 0006 01/29/18 0922  WBC 10.4  --  6.4 7.3 6.9  NEUTROABS 7.8*  --   --   --   --   HGB 16.5 16.7 15.1 15.5 16.9  HCT 48.0  49.0 44.9 46.1 50.1  MCV 97.6  --  100.0 99.4 98.8  PLT 135*  --  126* 130* 131*   Cardiac Enzymes:   No results for input(s): CKTOTAL, CKMB, CKMBINDEX, TROPONINI in the last 168 hours. BNP (last 3 results) No results for input(s): BNP in the last 8760 hours.  ProBNP (last 3 results) No results for input(s): PROBNP in the last 8760 hours.  CBG: No results for input(s): GLUCAP in the last 168 hours.  Recent Results (from the past 240 hour(s))  Culture, blood (routine x 2)     Status: None (Preliminary result)   Collection Time: 01/26/18  7:15 PM  Result Value Ref Range Status   Specimen Description BLOOD RIGHT THUMB  Final   Special Requests   Final    BOTTLES DRAWN AEROBIC AND ANAEROBIC Blood Culture adequate volume    Culture   Final    NO GROWTH 4 DAYS Performed at [facility], [address], [place], [place] [address]    Report Status PENDING  Incomplete  Culture, blood (routine x 2)     Status: None (Preliminary result)   Collection Time: 01/26/18  7:20 PM  Result Value Ref Range Status   Specimen Description BLOOD RIGHT WRIST  Final   Special Requests   Final    BOTTLES DRAWN AEROBIC ONLY Blood Culture results may not be optimal due to an inadequate volume of blood received in culture bottles   Culture   Final    NO GROWTH 4 DAYS Performed at [facility], [place] [address], [place], [place] [address]    Report Status PENDING  Incomplete     Studies: No results found.  Scheduled Meds: . aspirin  325 mg Oral Daily  . atorvastatin  80 mg Oral q1800  . citalopram  40 mg Oral Daily  . clopidogrel  75 mg Oral Daily  . enoxaparin (LOVENOX) injection  40 mg Subcutaneous Q24H  . folic acid  1 mg Oral Daily  . gabapentin  100 mg Oral QHS  . multivitamin with minerals  1 tablet Oral Daily  . nicotine  21 mg Transdermal Daily  . senna-docusate  1 tablet Oral QHS  . thiamine  100 mg Oral Daily   Or  . thiamine  100 mg Intravenous Daily  . traZODone  50 mg Oral QHS    Continuous Infusions:    Time spent: 61mins I have personally reviewed and interpreted on  01/30/2018 daily labs, imagings as discussed above under date review session and assessment and plans.  I reviewed all nursing notes, pharmacy notes, consultant notes,  vitals, pertinent old records  I have discussed plan of care as described above with RN , patient on 01/30/2018   Florencia Reasons MD, PhD  [facility] Pager [phone]. If 7PM-7AM, please contact night-coverage at [email or web address], password [facility] 01/30/2018, 10:39 AM  LOS: 4 days

## 2018-01-31 LAB — CULTURE, BLOOD (ROUTINE X 2)
CULTURE: NO GROWTH
Culture: NO GROWTH
Special Requests: ADEQUATE

## 2018-01-31 MED ORDER — GABAPENTIN 100 MG PO CAPS
200.0000 mg | ORAL_CAPSULE | Freq: Every day | ORAL | Status: DC
  Administered 2018-01-31 – 2018-02-02 (×3): 200 mg via ORAL
  Filled 2018-01-31 (×3): qty 2

## 2018-01-31 NOTE — Progress Notes (Signed)
PROGRESS NOTE  Brian Huynh FMB:[id number] DOB: 10/30/1960 DOA: 01/26/2018 [facility]CP: [facility]  HPI/Recap of past 24 hours:  No new complaints Right sided weakness continue to improve, Right sided numbness and decreased sensation in lower leg persists  [facility]wait[facility] palcement  Assessment/Plan: Active Problems:   CVA (cerebral vascular accident) ([place])   Tobacco abuse   Essential hypertension   IV drug abuse ([place])   Thrombocytopenia ([place])  Stroke:  bilateral MCA/ACA watershed, right MCA/PCA watershed and b/l ACA patchy infarct, likely due to hypoperfusion from b/l high grade ICA stenosis. Neurology input appreciated, DAPT with asa 325mg  and plavix75mg  daily for three months then single agent Vascular surgery input appreciated, plan to have surgery in two weeks snf placement  Elevation of lft/ hepatitic C From alcohol use? 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 Improving, monitor  Polysubstance abuse + alcohol + cigarettes  +reports recent use of heroin uds on presentation negative  Restless leg, insomnia  neurontin nightly started on 8/10, titrate up   Code Status: full  Family Communication: patient   Disposition Plan: snf [facility])   Consultants:  Neurology  Vascular surgery  Procedures:  none  Antibiotics:  none   Objective: BP (!) 147/98 (BP Location: Left Arm)   Pulse 68   Temp 97.7 F (36.5 C) (Oral)   Resp 18   Ht 5\' 10"  (1.778 m)   Wt 94.3 kg   SpO2 97%   BMI 29.84 kg/m   Intake/Output Summary (Last 24 hours) at 01/31/2018 1110 Last data filed at 01/30/2018 1600 Gross per 24 hour  Intake 240 ml  Output 0 ml  Net 240 ml   Filed Weights   01/26/18 1053  Weight:  94.3 kg    Exam: Patient is examined daily including today on 01/31/2018, exams remain the same as of yesterday except that has changed    General:  NAD  Cardiovascular: RRR  Respiratory: CTABL  Abdomen: Soft/ND/NT, positive BS  Musculoskeletal: No Edema  Neuro: alert, oriented , right sided weakness is improving  Data Reviewed: Basic Metabolic Panel: Recent Labs  Lab 01/26/18 1102 01/26/18 1109 01/27/18 0231 01/28/18 0006 01/29/18 0922  NA 138 138 139 141 141  K 4.2 4.0 3.5 4.0 4.1  CL 102 102 102 107 105  CO2 25  --  27 27 28   GLUCOSE 101* 100* 89 95 92  BUN 9 11 12 11 11   CREATININE 1.16 1.10 1.08 1.12 1.24  CALCIUM 8.8*  --  8.3* 8.4* 8.9  MG  --   --   --  2.1  --    Liver Function Tests: Recent Labs  Lab 01/26/18 1102 01/27/18 0231 01/28/18 0006 01/29/18 0922  AST 70* 57* 61* 75*  ALT 78* 68* 74* 89*  ALKPHOS 64 58 60 65  BILITOT 1.9* 1.1 1.0 1.1  PROT 7.3 6.4* 6.6 7.5  ALBUMIN 3.3* 2.9* 2.9* 3.3*   No results for input(s): LIPASE, AMYLASE in the last 168 hours. No results for input(s): AMMONIA in the last 168 hours. CBC: Recent Labs  Lab 01/26/18 1102 01/26/18 1109 01/27/18 0231 01/28/18 0006 01/29/18 0922  WBC 10.4  --  6.4 7.3 6.9  NEUTROABS 7.8*  --   --   --   --  HGB 16.5 16.7 15.1 15.5 16.9  HCT 48.0 49.0 44.9 46.1 50.1  MCV 97.6  --  100.0 99.4 98.8  PLT 135*  --  126* 130* 131*   Cardiac Enzymes:   No results for input(s): CKTOTAL, CKMB, CKMBINDEX, TROPONINI in the last 168 hours. BNP (last 3 results) No results for input(s): BNP in the last 8760 hours.  ProBNP (last 3 results) No results for input(s): PROBNP in the last 8760 hours.  CBG: No results for input(s): GLUCAP in the last 168 hours.  Recent Results (from the past 240 hour(s))  Culture, blood (routine x 2)     Status: None (Preliminary result)   Collection Time: 01/26/18  7:15 PM  Result Value Ref Range Status   Specimen Description BLOOD RIGHT THUMB  Final    Special Requests   Final    BOTTLES DRAWN AEROBIC AND ANAEROBIC Blood Culture adequate volume   Culture   Final    NO GROWTH 4 DAYS Performed at [facility], [address] [place], [place] [address]    Report Status PENDING  Incomplete  Culture, blood (routine x 2)     Status: None (Preliminary result)   Collection Time: 01/26/18  7:20 PM  Result Value Ref Range Status   Specimen Description BLOOD RIGHT WRIST  Final   Special Requests   Final    BOTTLES DRAWN AEROBIC ONLY Blood Culture results may not be optimal due to an inadequate volume of blood received in culture bottles   Culture   Final    NO GROWTH 4 DAYS Performed at [facility], [place] [address], [place], [place] [address]    Report Status PENDING  Incomplete     Studies: No results found.  Scheduled Meds: . aspirin  325 mg Oral Daily  . atorvastatin  80 mg Oral q1800  . citalopram  40 mg Oral Daily  . clopidogrel  75 mg Oral Daily  . enoxaparin (LOVENOX) injection  40 mg Subcutaneous Q24H  . folic acid  1 mg Oral Daily  . gabapentin  200 mg Oral QHS  . multivitamin with minerals  1 tablet Oral Daily  . nicotine  21 mg Transdermal Daily  . senna-docusate  1 tablet Oral QHS  . thiamine  100 mg Oral Daily   Or  . thiamine  100 mg Intravenous Daily  . traZODone  50 mg Oral QHS    Continuous Infusions:    Time spent: 15mins I have personally reviewed and interpreted on  01/31/2018 daily labs, imagings as discussed above under date review session and assessment and plans.  I reviewed all nursing notes, pharmacy notes, consultant notes,  vitals, pertinent old records  I have discussed plan of care as described above with RN , patient on 01/31/2018   Florencia Reasons MD, PhD  [facility] Pager [phone]. If 7PM-7AM, please contact night-coverage at [email or web address], password [facility] 01/31/2018, 11:10 AM  LOS: 5 days

## 2018-02-01 LAB — CBC
HCT: 52.2 % — ABNORMAL HIGH (ref 39.0–52.0)
HEMOGLOBIN: 18 g/dL — AB (ref 13.0–17.0)
MCH: 33.6 pg (ref 26.0–34.0)
MCHC: 34.5 g/dL (ref 30.0–36.0)
MCV: 97.4 fL (ref 78.0–100.0)
PLATELETS: 142 10*3/uL — AB (ref 150–400)
RBC: 5.36 MIL/uL (ref 4.22–5.81)
RDW: 12.7 % (ref 11.5–15.5)
WBC: 9.5 10*3/uL (ref 4.0–10.5)

## 2018-02-01 LAB — BASIC METABOLIC PANEL
ANION GAP: 7 (ref 5–15)
BUN: 20 mg/dL (ref 6–20)
CHLORIDE: 106 mmol/L (ref 98–111)
CO2: 26 mmol/L (ref 22–32)
Calcium: 9 mg/dL (ref 8.9–10.3)
Creatinine, Ser: 1.24 mg/dL (ref 0.61–1.24)
GFR calc Af Amer: >60 mL/min (ref >60)
GLUCOSE: 95 mg/dL (ref 70–99)
POTASSIUM: 4 mmol/L (ref 3.5–5.1)
SODIUM: 139 mmol/L (ref 135–145)

## 2018-02-01 LAB — HEPATIC FUNCTION PANEL
ALK PHOS: 71 U/L (ref 38–126)
ALT: 120 U/L — AB (ref 0–44)
AST: 86 U/L — AB (ref 15–41)
Albumin: 3.3 g/dL — ABNORMAL LOW (ref 3.5–5.0)
Bilirubin, Direct: 0.3 mg/dL — ABNORMAL HIGH (ref 0.0–0.2)
Indirect Bilirubin: 1.1 mg/dL — ABNORMAL HIGH (ref 0.3–0.9)
TOTAL PROTEIN: 7.6 g/dL (ref 6.5–8.1)
Total Bilirubin: 1.4 mg/dL — ABNORMAL HIGH (ref 0.3–1.2)

## 2018-02-01 NOTE — Care Management Note (Addendum)
Case Management Note  Patient Details  Name: Brian Huynh MRN: [id number] Date of Birth: December 01, 1960  Subjective/Objective:      Pt admitted with CVA. [facility] was staying at the [facility].  [facility]: [place] at [facility]             Action/Plan: Awaiting SNF bed at a contracted [facility] facility. CM following.  Contact person: son: Marcello Moores [phone]  Expected Discharge Date:                  Expected Discharge Plan:  Skilled Nursing Facility  In-House Referral:  Clinical Social Work  Discharge planning Services     Post Acute Care Choice:    Choice offered to:     DME Arranged:    DME Agency:     HH Arranged:    [place] Agency:     Status of Service:  In process, will continue to follow  If discussed at Long Length of Stay Meetings, dates discussed:    Additional Comments:  Pollie Friar, RN 02/01/2018, 1:25 PM

## 2018-02-01 NOTE — Progress Notes (Signed)
Physical Therapy Treatment Patient Details Name: Brian Huynh MRN: [id number] DOB: [birth year] Today's Date: 02/01/2018    History of Present Illness Brian Huynh is 57 y.o. 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 PMH includes HepC, Jaundice, substance abuse, and alcohol abuse.      PT Comments    Patient continues to make progress toward PT goals. Pt requires min A for gait training without AD due to impaired balance. Continue to progress as tolerated.    Follow Up Recommendations  CIR     Equipment Recommendations  None recommended by PT    Recommendations for Other Services OT consult     Precautions / Restrictions Precautions Precautions: Fall    Mobility  Bed Mobility Overal bed mobility: Modified Independent Bed Mobility: Supine to Sit;Sit to Supine              Transfers Overall transfer level: Needs assistance Equipment used: None Transfers: Sit to/from [facility] Sit to Stand: Min guard         General transfer comment: for safety  Ambulation/Gait Ambulation/Gait assistance: Min assist Gait Distance (Feet): 200 Feet Assistive device: None(assist at trunk with gait belt) Gait Pattern/deviations: Decreased step length - left;Decreased dorsiflexion - right;Step-through pattern;Decreased step length - right;Drifts right/left Gait velocity: Decreased   General Gait Details: pt with unsteady gait and decreased cadence; cues for forward gaze and increased stride lenght   Stairs             Wheelchair Mobility    Modified Rankin (Stroke Patients Only) Modified Rankin (Stroke Patients Only) Modified Rankin: Moderately severe disability     Balance Overall balance assessment: Needs assistance Sitting-balance support: No upper extremity supported;Feet unsupported Sitting balance-Leahy Scale: Good     Standing balance support: Single extremity supported;During  functional activity Standing balance-Leahy Scale: Poor                   Standardized Balance Assessment Standardized Balance Assessment : Dynamic Gait Index   Dynamic Gait Index Level Surface: Mild Impairment Change in Gait Speed: Mild Impairment Gait with Horizontal Head Turns: Mild Impairment Gait with Vertical Head Turns: Mild Impairment Gait and Pivot Turn: Normal Step Over Obstacle: Normal Step Around Obstacles: Normal Steps: Mild Impairment Total Score: 19      Cognition Arousal/Alertness: Awake/alert Behavior During Therapy: WFL for tasks assessed/performed Overall Cognitive Status: Within Functional Limits for tasks assessed                                        Exercises      General Comments        Pertinent Vitals/Pain Pain Assessment: No/denies pain    Home Living                      Prior Function            PT Goals (current goals can now be found in the care plan section) Acute Rehab PT Goals Patient Stated Goal: to get stronger PT Goal Formulation: With patient Time For Goal Achievement: 02/09/18 Potential to Achieve Goals: Good Progress towards PT goals: Progressing toward goals    Frequency    Min 4X/week      PT Plan Current plan remains appropriate    Co-evaluation  AM-PAC PT "6 Clicks" Daily Activity  Outcome Measure  Difficulty turning over in bed (including adjusting bedclothes, sheets and blankets)?: None Difficulty moving from lying on back to sitting on the side of the bed? : A Little Difficulty sitting down on and standing up from a chair with arms (e.g., wheelchair, bedside commode, etc,.)?: A Little Help needed moving to and from a bed to chair (including a wheelchair)?: A Little Help needed walking in hospital room?: A Little Help needed climbing 3-5 steps with a railing? : A Little 6 Click Score: 19    End of Session Equipment Utilized During Treatment: Gait  belt Activity Tolerance: Patient tolerated treatment well Patient left: with call bell/phone within reach;in bed Nurse Communication: Mobility status PT Visit Diagnosis: Unsteadiness on feet (R26.81);Other abnormalities of gait and mobility (R26.89);Muscle weakness (generalized) (M62.81);History of falling (Z91.81);Difficulty in walking, not elsewhere classified (R26.2);Other symptoms and signs involving the nervous system (R29.898);Pain;Hemiplegia and hemiparesis Hemiplegia - Right/Left: Right Hemiplegia - dominant/non-dominant: Dominant Hemiplegia - caused by: Cerebral infarction Pain - part of body: (back)     Time: 6160-7371 PT Time Calculation (min) (ACUTE ONLY): 15 min  Charges:  $Gait Training: 8-22 mins                     [name], PTA Pager: [phone]     [name] 02/01/2018, 2:57 PM

## 2018-02-01 NOTE — Progress Notes (Signed)
PROGRESS NOTE  Brian Huynh [id number] DOB: 07/07/1960 DOA: 01/26/2018 PCP: [facility]  HPI/Recap of past 24 hours:  No new complaints Right sided weakness continue to improve, Right sided numbness and decreased sensation in lower leg persists  He reports ambulated in hallway today  Awaiting [facility]  rehab palcement  Assessment/Plan: Active Problems:   CVA (cerebral vascular accident) ([place])   Tobacco abuse   Essential hypertension   IV drug abuse ([place])   Thrombocytopenia ([place])  Stroke:  bilateral MCA/ACA watershed, right MCA/PCA watershed and b/l ACA patchy infarct, likely due to hypoperfusion from b/l high grade ICA stenosis. Neurology input appreciated, DAPT with asa 325mg  and plavix75mg  daily for three months then single agent Vascular surgery input appreciated, plan to have surgery in two weeks VA snf placement  Elevation of lft/ hepatitic C From alcohol use? 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 Improving, monitor  Polysubstance abuse + alcohol + cigarettes  +reports recent use of heroin uds on presentation negative  Restless leg, insomnia  neurontin nightly started on 8/10, titrate up   Code Status: full  Family Communication: patient   Disposition Plan: snf [facility])   Consultants:  Neurology  Vascular surgery  Procedures:  none  Antibiotics:  none   Objective: BP 135/83 (BP Location: Left Arm)   Pulse 65   Temp 99 F (37.2 C) (Oral)   Resp 18   Ht 5\' 10"  (1.778 m)   Wt 94.3 kg   SpO2 99%   BMI 29.84 kg/m   Intake/Output Summary (Last 24 hours) at 02/01/2018 1619 Last data filed at 02/01/2018 1100 Gross per 24 hour  Intake 480 ml  Output -  Net 480 ml   Filed Weights   01/26/18 1053  Weight: 94.3 kg    Exam: Patient is examined daily including today on 02/01/2018, exams remain the same as of yesterday except that has changed    General:  NAD  Cardiovascular: RRR  Respiratory: CTABL  Abdomen: Soft/ND/NT, positive BS  Musculoskeletal: No Edema  Neuro: alert, oriented , right sided weakness is improving  Data Reviewed: Basic Metabolic Panel: Recent Labs  Lab 01/26/18 1102 01/26/18 1109 01/27/18 0231 01/28/18 0006 01/29/18 0922 02/01/18 0911  NA 138 138 139 141 141 139  K 4.2 4.0 3.5 4.0 4.1 4.0  CL 102 102 102 107 105 106  CO2 25  --  27 27 28 26   GLUCOSE 101* 100* 89 95 92 95  BUN 9 11 12 11 11 20   CREATININE 1.16 1.10 1.08 1.12 1.24 1.24  CALCIUM 8.8*  --  8.3* 8.4* 8.9 9.0  MG  --   --   --  2.1  --   --    Liver Function Tests: Recent Labs  Lab 01/26/18 1102 01/27/18 0231 01/28/18 0006 01/29/18 0922 02/01/18 0911  AST 70* 57* 61* 75* 86*  ALT 78* 68* 74* 89* 120*  ALKPHOS 64 58 60 65 71  BILITOT 1.9* 1.1 1.0 1.1 1.4*  PROT 7.3 6.4* 6.6 7.5 7.6  ALBUMIN 3.3* 2.9* 2.9* 3.3* 3.3*   No results for input(s): LIPASE, AMYLASE in the last 168 hours. No results for input(s): AMMONIA in the last 168 hours. CBC: Recent Labs  Lab 01/26/18 1102 01/26/18 1109 01/27/18  0231 01/28/18 0006 01/29/18 0922 02/01/18 0911  WBC 10.4  --  6.4 7.3 6.9 9.5  NEUTROABS 7.8*  --   --   --   --   --   HGB 16.5 16.7 15.1 15.5 16.9 18.0*  HCT 48.0 49.0 44.9 46.1 50.1 52.2*  MCV 97.6  --  100.0 99.4 98.8 97.4  PLT 135*  --  126* 130* 131* 142*   Cardiac Enzymes:   No results for input(s): CKTOTAL, CKMB, CKMBINDEX, TROPONINI in the last 168 hours. BNP (last 3 results) No results for input(s): BNP in the last 8760 hours.  ProBNP (last 3 results) No results for input(s): PROBNP in the last 8760 hours.  CBG: No results for input(s): GLUCAP in the last 168 hours.  Recent Results (from the past 240 hour(s))  Culture, blood (routine x  2)     Status: None   Collection Time: 01/26/18  7:15 PM  Result Value Ref Range Status   Specimen Description BLOOD RIGHT THUMB  Final   Special Requests   Final    BOTTLES DRAWN AEROBIC AND ANAEROBIC Blood Culture adequate volume   Culture   Final    NO GROWTH 5 DAYS Performed at [facility], [address] [place], [place] [address]    Report Status 01/31/2018 FINAL  Final  Culture, blood (routine x 2)     Status: None   Collection Time: 01/26/18  7:20 PM  Result Value Ref Range Status   Specimen Description BLOOD RIGHT WRIST  Final   Special Requests   Final    BOTTLES DRAWN AEROBIC ONLY Blood Culture results may not be optimal due to an inadequate volume of blood received in culture bottles   Culture   Final    NO GROWTH 5 DAYS Performed at [facility], [place] [address], [place], [place] [address]    Report Status 01/31/2018 FINAL  Final     Studies: No results found.  Scheduled Meds: . aspirin  325 mg Oral Daily  . atorvastatin  80 mg Oral q1800  . citalopram  40 mg Oral Daily  . clopidogrel  75 mg Oral Daily  . enoxaparin (LOVENOX) injection  40 mg Subcutaneous Q24H  . folic acid  1 mg Oral Daily  . gabapentin  200 mg Oral QHS  . multivitamin with minerals  1 tablet Oral Daily  . nicotine  21 mg Transdermal Daily  . senna-docusate  1 tablet Oral QHS  . thiamine  100 mg Oral Daily   Or  . thiamine  100 mg Intravenous Daily  . traZODone  50 mg Oral QHS    Continuous Infusions:    Time spent: 83mins I have personally reviewed and interpreted on  02/01/2018 daily labs, imagings as discussed above under date review session and assessment and plans.  I reviewed all nursing notes, pharmacy notes, consultant notes,  vitals, pertinent old records  I have discussed plan of care as described above with RN , patient on 02/01/2018   Florencia Reasons MD, PhD  [facility] Pager [phone]. If 7PM-7AM, please contact night-coverage at [email or web address], password  [facility] 02/01/2018, 4:19 PM  LOS: 6 days

## 2018-02-01 NOTE — Progress Notes (Addendum)
CSW following for discharge plan. CSW contacted Admissions at [facility]; they are clinically able to accept him, but they do not have any VA beds available at this time.  CSW reaching out to contracted facilities within the [facility]. CSW contacted Admissions at [facility] in [place]. Per Admissions, they will take a look at the referral and will determine if they can make a bed offer, and then will have to contact the [facility] for them to reach out to the [facility] for approval. CSW faxed referral, awaiting decision.  CSW to follow. Patient has no bed through a VA contracted SNF at this time.  Laveda Abbe, [place] Clinical Social Worker [phone]

## 2018-02-01 NOTE — Progress Notes (Signed)
Occupational Therapy Treatment Patient Details Name: Brian Huynh MRN: [id number] DOB: 05/19/1961 Today's Date: 02/01/2018    History of present illness Brian Huynh is 57 y.o. 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 PMH includes HepC, Jaundice, substance abuse, and alcohol abuse.     OT comments  Pt continues to make progress towards OT goals. This session able to demonstrate toilet transfer, peri care, sink level grooming, tub transfer (in neuro gym) as well as established HEP for R hand with theraputty and fine motor coordination. Continues to have pain at R shoulder from fall. OT will continue to follow acutely, dc changed to reflect placement needs.    Follow Up Recommendations  SNF    Equipment Recommendations    defer to next venue of care   Recommendations for Other Services      Precautions / Restrictions Precautions Precautions: Fall Restrictions Weight Bearing Restrictions: No       Mobility Bed Mobility Overal bed mobility: Modified Independent Bed Mobility: Supine to Sit              Transfers Overall transfer level: Needs assistance Equipment used: None Transfers: Sit to/from Stand;Stand Pivot Transfers Sit to Stand: Min guard;Supervision         General transfer comment: for safety    Balance Overall balance assessment: Needs assistance Sitting-balance support: No upper extremity supported;Feet unsupported Sitting balance-Leahy Scale: Good     Standing balance support: Single extremity supported;During functional activity Standing balance-Leahy Scale: Fair                   Standardized Balance Assessment Standardized Balance Assessment : Dynamic Gait Index   Dynamic Gait Index Level Surface: Mild Impairment Change in Gait Speed: Mild Impairment Gait with Horizontal Head Turns: Mild Impairment Gait with Vertical Head Turns: Mild Impairment Gait and Pivot Turn:  Normal Step Over Obstacle: Normal Step Around Obstacles: Normal Steps: Mild Impairment Total Score: 19     ADL either performed or assessed with clinical judgement   ADL Overall ADL's : Needs assistance/impaired     Grooming: Supervision/safety;Standing;Wash/dry hands;Wash/dry face Grooming Details (indicate cue type and reason): sink level                 Toilet Transfer: Supervision/safety;Ambulation       Tub/ Shower Transfer: Tub transfer;Supervision/safety;Ambulation   Functional mobility during ADLs: Supervision/safety       Vision       Perception     Praxis      Cognition Arousal/Alertness: Awake/alert Behavior During Therapy: WFL for tasks assessed/performed Overall Cognitive Status: Within Functional Limits for tasks assessed                                          Exercises Exercises: Other exercises Other Exercises Other Exercises: fine motor coordination handout provided and reviewed in full Other Exercises: theraputty exercise handout provided and reviewed in full   Shoulder Instructions       General Comments      Pertinent Vitals/ Pain       Pain Assessment: No/denies pain  Home Living  Prior Functioning/Environment              Frequency  Min 2X/week        Progress Toward Goals  OT Goals(current goals can now be found in the care plan section)  Progress towards OT goals: Progressing toward goals  Acute Rehab OT Goals Patient Stated Goal: to get stronger OT Goal Formulation: With patient Time For Goal Achievement: 02/10/18 Potential to Achieve Goals: Good  Plan Discharge plan needs to be updated;Frequency needs to be updated    Co-evaluation                 AM-PAC PT "6 Clicks" Daily Activity     Outcome Measure   Help from another person eating meals?: None Help from another person taking care of personal grooming?:  None Help from another person toileting, which includes using toliet, bedpan, or urinal?: None Help from another person bathing (including washing, rinsing, drying)?: A Little Help from another person to put on and taking off regular upper body clothing?: A Little Help from another person to put on and taking off regular lower body clothing?: A Little 6 Click Score: 21    End of Session Equipment Utilized During Treatment: Gait belt  OT Visit Diagnosis: Unsteadiness on feet (R26.81);Other abnormalities of gait and mobility (R26.89);Muscle weakness (generalized) (M62.81);Other symptoms and signs involving the nervous system (R29.898);Hemiplegia and hemiparesis Hemiplegia - Right/Left: Right Hemiplegia - dominant/non-dominant: Non-Dominant Hemiplegia - caused by: Cerebral infarction   Activity Tolerance Patient tolerated treatment well   Patient Left in chair;with call bell/phone within reach;with chair alarm set   Nurse Communication Mobility status        Time: 6837-2902 OT Time Calculation (min): 25 min  Charges: OT General Charges $OT Visit: 1 Visit OT Treatments $Self Care/Home Management : 8-22 mins $Therapeutic Exercise: 8-22 mins  Brian Huynh OTR/L [place] 02/01/2018, 5:14 PM

## 2018-02-02 MED ORDER — ATORVASTATIN CALCIUM 40 MG PO TABS
40.0000 mg | ORAL_TABLET | Freq: Every day | ORAL | Status: DC
  Administered 2018-02-02: 40 mg via ORAL
  Filled 2018-02-02: qty 1

## 2018-02-02 NOTE — Progress Notes (Signed)
PROGRESS NOTE  Brian Huynh [id number] DOB: 05-05-1961 DOA: 01/26/2018 [facility]: [facility]  HPI/Recap of past 24 hours:  No new complaints Right sided weakness continue to improve, Right sided numbness and decreased sensation in lower leg persists  He has elevated lft, he denies ab pain, no n/v, he is tolerating regular diet  He reports ambulated in hallway today  Awaiting VA  rehab palcement  Assessment/Plan: Active Problems:   CVA (cerebral vascular accident) ([place])   Tobacco abuse   Essential hypertension   IV drug abuse ([place])   Thrombocytopenia ([place])  Stroke:  bilateral MCA/ACA watershed, right MCA/PCA watershed and b/l ACA patchy infarct, likely due to hypoperfusion from b/l high grade ICA stenosis. Neurology input appreciated, DAPT with asa 325mg  and plavix75mg  daily for three months then single agent, statin. He is to follow up with neurology in 4 weeks, neurology signed off on 8/7. Vascular surgery input appreciated, plan to return to have surgery  on 8/20, sbp goal prior to surgery keep at 130 -160 Currently he is ready to discharge, awaiting VA snf placement   Elevation of lft/ hepatitic C From alcohol use? 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 plt nadir at 126 Improving, monitor  Polysubstance abuse + alcohol + cigarettes  +reports recent use of heroin uds on presentation negative  Restless leg, insomnia  neurontin nightly started on 8/10, titrate up   Code Status: full  Family Communication: patient   Disposition Plan:  Medically ready to discharge, has been waiting for VA snf bed.   Consultants:  Neurology  Vascular  surgery  Procedures:  none  Antibiotics:  none   Objective: BP 137/79 (BP Location: Left Arm)   Pulse (!) 59   Temp 98.2 F (36.8 C) (Oral)   Resp 16   Ht 5\' 10"  (1.778 m)   Wt 94.3 kg   SpO2 97%   BMI 29.84 kg/m   Intake/Output Summary (Last 24 hours) at 02/02/2018 1114 Last data filed at 02/02/2018 0817 Gross per 24 hour  Intake 600 ml  Output 1 ml  Net 599 ml   Filed Weights   01/26/18 1053  Weight: 94.3 kg    Exam: Patient is examined daily including today on 02/02/2018, exams remain the same as of yesterday except that has changed    General:  NAD  Cardiovascular: RRR  Respiratory: CTABL  Abdomen: Soft/ND/NT, positive BS  Musculoskeletal: No Edema  Neuro: alert, oriented , right sided weakness is improving  Data Reviewed: Basic Metabolic Panel: Recent Labs  Lab 01/27/18 0231 01/28/18 0006 01/29/18 0922 02/01/18 0911  NA 139 141 141 139  K 3.5 4.0 4.1 4.0  CL 102 107 105 106  CO2 27 27 28 26   GLUCOSE 89 95 92 95  BUN 12 11 11 20   CREATININE 1.08 1.12 1.24 1.24  CALCIUM 8.3* 8.4* 8.9 9.0  MG  --  2.1  --   --    Liver Function Tests: Recent Labs  Lab 01/27/18 0231 01/28/18 0006 01/29/18 0922 02/01/18 0911  AST 57* 61* 75* 86*  ALT 68* 74* 89* 120*  ALKPHOS 58 60 65 71  BILITOT 1.1 1.0 1.1 1.4*  PROT 6.4* 6.6 7.5 7.6  ALBUMIN  2.9* 2.9* 3.3* 3.3*   No results for input(s): LIPASE, AMYLASE in the last 168 hours. No results for input(s): AMMONIA in the last 168 hours. CBC: Recent Labs  Lab 01/27/18 0231 01/28/18 0006 01/29/18 0922 02/01/18 0911  WBC 6.4 7.3 6.9 9.5  HGB 15.1 15.5 16.9 18.0*  HCT 44.9 46.1 50.1 52.2*  MCV 100.0 99.4 98.8 97.4  PLT 126* 130* 131* 142*   Cardiac Enzymes:   No results for input(s): CKTOTAL, CKMB, CKMBINDEX, TROPONINI in the last 168 hours. BNP (last 3 results) No results for input(s): BNP in the last 8760 hours.  ProBNP (last 3 results) No results for input(s): PROBNP in the last 8760  hours.  CBG: No results for input(s): GLUCAP in the last 168 hours.  Recent Results (from the past 240 hour(s))  Culture, blood (routine x 2)     Status: None   Collection Time: 01/26/18  7:15 PM  Result Value Ref Range Status   Specimen Description BLOOD RIGHT THUMB  Final   Special Requests   Final    BOTTLES DRAWN AEROBIC AND ANAEROBIC Blood Culture adequate volume   Culture   Final    NO GROWTH 5 DAYS Performed at [facility], [address], [place], [place] [address]    Report Status 01/31/2018 FINAL  Final  Culture, blood (routine x 2)     Status: None   Collection Time: 01/26/18  7:20 PM  Result Value Ref Range Status   Specimen Description BLOOD RIGHT WRIST  Final   Special Requests   Final    BOTTLES DRAWN AEROBIC ONLY Blood Culture results may not be optimal due to an inadequate volume of blood received in culture bottles   Culture   Final    NO GROWTH 5 DAYS Performed at [facility], [place] [address] [place], [place] [address]    Report Status 01/31/2018 FINAL  Final     Studies: No results found.  Scheduled Meds: . aspirin  325 mg Oral Daily  . atorvastatin  80 mg Oral q1800  . citalopram  40 mg Oral Daily  . clopidogrel  75 mg Oral Daily  . enoxaparin (LOVENOX) injection  40 mg Subcutaneous Q24H  . folic acid  1 mg Oral Daily  . gabapentin  200 mg Oral QHS  . multivitamin with minerals  1 tablet Oral Daily  . nicotine  21 mg Transdermal Daily  . thiamine  100 mg Oral Daily   Or  . thiamine  100 mg Intravenous Daily  . traZODone  50 mg Oral QHS    Continuous Infusions:    Time spent: 54mins I have personally reviewed and interpreted on  02/02/2018 daily labs, imagings as discussed above under date review session and assessment and plans.  I reviewed all nursing notes, pharmacy notes, consultant notes,  vitals, pertinent old records  I have discussed plan of care as described above with RN , patient on 02/02/2018   Florencia Reasons MD,  PhD  [facility] Pager [phone]. If 7PM-7AM, please contact night-coverage at [email or web address], password [facility] 02/02/2018, 11:14 AM  LOS: 7 days

## 2018-02-02 NOTE — Progress Notes (Addendum)
Physical Therapy Treatment Patient Details Name: Brian Huynh MRN: [id number] DOB: Apr 11, 1961 Today's Date: 02/02/2018    History of Present Illness Rayshard Schirtzinger is 57 y.o. 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 PMH includes HepC, Jaundice, substance abuse, and alcohol abuse.      PT Comments    Patient continues to make progress toward PT goals and overall mod I/S for mobility without AD. Recommend outpatient PT for further skilled PT services to maximize independence and safety with mobility.    Follow Up Recommendations  Other (comment)(Neuro Outpatient PT )     Equipment Recommendations  None recommended by PT    Recommendations for Other Services       Precautions / Restrictions Precautions Precautions: Fall    Mobility  Bed Mobility Overal bed mobility: Independent                Transfers Overall transfer level: Modified independent Equipment used: None Transfers: Sit to/from Stand              Ambulation/Gait Ambulation/Gait assistance: Modified independent (Device/Increase time) Gait Distance (Feet): 300 Feet   Gait Pattern/deviations: Step-through pattern;Decreased stride length Gait velocity: Decreased   General Gait Details: cues for increased cadence   Stairs             Wheelchair Mobility    Modified Rankin (Stroke Patients Only) Modified Rankin (Stroke Patients Only) Modified Rankin: Moderate disability     Balance Overall balance assessment: Needs assistance Sitting-balance support: No upper extremity supported;Feet unsupported Sitting balance-Leahy Scale: Good     Standing balance support: No upper extremity supported;During functional activity Standing balance-Leahy Scale: Fair               High level balance activites: Side stepping;Backward walking;Direction changes;Turns;Sudden stops;Head turns High Level Balance Comments: no significant LOB              Cognition Arousal/Alertness: Awake/alert Behavior During Therapy: WFL for tasks assessed/performed Overall Cognitive Status: Within Functional Limits for tasks assessed                                        Exercises      General Comments        Pertinent Vitals/Pain Pain Assessment: No/denies pain    Home Living                      Prior Function            PT Goals (current goals can now be found in the care plan section) Acute Rehab PT Goals Patient Stated Goal: to get stronger PT Goal Formulation: With patient Time For Goal Achievement: 02/09/18 Potential to Achieve Goals: Good Progress towards PT goals: Progressing toward goals    Frequency    Min 4X/week      PT Plan Discharge plan needs to be updated    Co-evaluation              AM-PAC PT "6 Clicks" Daily Activity  Outcome Measure  Difficulty turning over in bed (including adjusting bedclothes, sheets and blankets)?: None Difficulty moving from lying on back to sitting on the side of the bed? : None Difficulty sitting down on and standing up from a chair with arms (e.g., wheelchair, bedside commode, etc,.)?: None Help needed moving to and from a  bed to chair (including a wheelchair)?: None Help needed walking in hospital room?: A Little Help needed climbing 3-5 steps with a railing? : A Little 6 Click Score: 22    End of Session Equipment Utilized During Treatment: Gait belt Activity Tolerance: Patient tolerated treatment well Patient left: with call bell/phone within reach;in bed Nurse Communication: Mobility status PT Visit Diagnosis: Unsteadiness on feet (R26.81);Other abnormalities of gait and mobility (R26.89);Muscle weakness (generalized) (M62.81);History of falling (Z91.81);Difficulty in walking, not elsewhere classified (R26.2);Other symptoms and signs involving the nervous system (R29.898);Pain;Hemiplegia and hemiparesis Hemiplegia -  Right/Left: Right Hemiplegia - dominant/non-dominant: Dominant Hemiplegia - caused by: Cerebral infarction Pain - part of body: (back)     Time: 9211-9417 PT Time Calculation (min) (ACUTE ONLY): 16 min  Charges:  $Gait Training: 8-22 mins                     [name], PTA Pager: [phone]     [name] 02/02/2018, 4:01 PM

## 2018-02-03 MED ORDER — GABAPENTIN 100 MG PO CAPS: 200.0000 mg | ORAL_CAPSULE | Freq: Every day | ORAL | 0 refills | Status: AC

## 2018-02-03 MED ORDER — ATORVASTATIN CALCIUM 40 MG PO TABS: 40.0000 mg | ORAL_TABLET | Freq: Every day | ORAL | 0 refills | Status: DC

## 2018-02-03 MED ORDER — CLOPIDOGREL BISULFATE 75 MG PO TABS: 75.0000 mg | ORAL_TABLET | Freq: Every day | ORAL | 0 refills | Status: AC

## 2018-02-03 MED ORDER — ASPIRIN 325 MG PO TABS: 325.0000 mg | ORAL_TABLET | Freq: Every day | ORAL | 0 refills | Status: AC

## 2018-02-03 NOTE — Care Management Note (Addendum)
Case Management Note  Patient Details  Name: Brian Huynh MRN: [id number] Date of Birth: 02-16-1961  Subjective/Objective:  Pt presented for weakness. Positive for CVA- PTA from [facility]. Pt states he has a son, however will not be able to stay with him long. Patient states if he stays away from the shelter that his bed may be given away. Pt is without Insurance and uses the [place] in [place]. Ralene Bathe is the CSW at [phone]. Pt had been approved for Short term rehab at SNF- however no bed availability at the Facilities. CSW following to see if the [facility] can accept [facility] to come in to provide therapy.           Action/Plan: CM did speak with Maggie at he [facility] and she can authorize HH if the [place] can accept agency. CM will continue to follow and set up.  Expected Discharge Date:                  Expected Discharge Plan:  [place]  In-House Referral:  Clinical Social Work  Discharge planning Services  CM Consult  Post Acute Care Choice:  Home Health Choice offered to:  Patient  DME Arranged:  N/A DME Agency:  NA  HH Arranged:  PT [place] Agency:  [facility].  Status of Service:  Completed, signed off  If discussed at [place] of Stay Meetings, dates discussed:    Additional Comments: 7371 02-03-18 Jacqlyn Krauss, RN,BSN [phone] CM did fax information to [facility]. Brother states he will be able to assist with medication cost post transition out of hospital. CM did fax ne Rx to [place]. No further needs from CM at this time.    1405 02-03-18 Jacqlyn Krauss, RN,BSN [phone] CM did speak with CSW and patient will be able to use the Library at the [facility] for [place]. Referral called to [place] with [facility]. Plan will be to start care within the next 24-48 hours post transition home. [facility] not able to assist with Outpatient PT Services cost. CSW to  provide patient with Bus Passes. Maggie with [facility] aware that the plan will be for [place] in order to authorize. No further needs from CM at this time.  Bethena Roys, RN 02/03/2018, 11:54 AM

## 2018-02-03 NOTE — Progress Notes (Signed)
Physical Therapy Treatment Patient Details Name: Brian Huynh MRN: [id number] DOB: October 07, 1960 Today's Date: 02/03/2018    History of Present Illness Brian Huynh is 57 y.o. 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 PMH includes HepC, Jaundice, substance abuse, and alcohol abuse.      PT Comments    Patient continues to make progress toward PT goals. Pt presents with R UE weakness and R shoulder pain (especially with flexion). R UE AROM continues to improve. Current plan remains appropriate.    Follow Up Recommendations  Other (comment)(Neuro Outpatient PT )     Equipment Recommendations  None recommended by PT    Recommendations for Other Services       Precautions / Restrictions Precautions Precautions: Fall    Mobility  Bed Mobility Overal bed mobility: Independent                Transfers Overall transfer level: Modified independent Equipment used: None Transfers: Sit to/from Stand              Ambulation/Gait Ambulation/Gait assistance: Modified independent (Device/Increase time)     Gait Pattern/deviations: Step-through pattern;Decreased stride length Gait velocity: Decreased   General Gait Details: cues for stride length and cadence; balance and bilat step length continues to improve   Stairs             Wheelchair Mobility    Modified Rankin (Stroke Patients Only) Modified Rankin (Stroke Patients Only) Modified Rankin: Moderate disability     Balance Overall balance assessment: Needs assistance Sitting-balance support: No upper extremity supported;Feet unsupported Sitting balance-Leahy Scale: Good     Standing balance support: No upper extremity supported;During functional activity Standing balance-Leahy Scale: Fair                              Cognition Arousal/Alertness: Awake/alert Behavior During Therapy: WFL for tasks assessed/performed Overall  Cognitive Status: Within Functional Limits for tasks assessed                                        Exercises Shoulder Exercises Shoulder Flexion: Right;AROM    General Comments        Pertinent Vitals/Pain Pain Assessment: Faces Faces Pain Scale: Hurts a little bit Pain Location: R shoulder especially with flexion Pain Descriptors / Indicators: Guarding;Sore Pain Intervention(s): Limited activity within patient's tolerance    Home Living                      Prior Function            PT Goals (current goals can now be found in the care plan section) Acute Rehab PT Goals Patient Stated Goal: to get stronger PT Goal Formulation: With patient Time For Goal Achievement: 02/09/18 Potential to Achieve Goals: Good Progress towards PT goals: Progressing toward goals    Frequency    Min 4X/week      PT Plan Current plan remains appropriate    Co-evaluation              AM-PAC PT "6 Clicks" Daily Activity  Outcome Measure  Difficulty turning over in bed (including adjusting bedclothes, sheets and blankets)?: None Difficulty moving from lying on back to sitting on the side of the bed? : None Difficulty sitting down on and standing  up from a chair with arms (e.g., wheelchair, bedside commode, etc,.)?: None Help needed moving to and from a bed to chair (including a wheelchair)?: None Help needed walking in hospital room?: A Little Help needed climbing 3-5 steps with a railing? : A Little 6 Click Score: 22    End of Session Equipment Utilized During Treatment: Gait belt Activity Tolerance: Patient tolerated treatment well Patient left: with call bell/phone within reach;in bed Nurse Communication: Mobility status PT Visit Diagnosis: Unsteadiness on feet (R26.81);Other abnormalities of gait and mobility (R26.89);Muscle weakness (generalized) (M62.81);History of falling (Z91.81);Difficulty in walking, not elsewhere classified (R26.2);Other  symptoms and signs involving the nervous system (R29.898);Pain;Hemiplegia and hemiparesis Hemiplegia - Right/Left: Right Hemiplegia - dominant/non-dominant: Dominant Hemiplegia - caused by: Cerebral infarction Pain - part of body: (back)     Time: 1345-1401 PT Time Calculation (min) (ACUTE ONLY): 16 min  Charges:  $Gait Training: 8-22 mins                     [name], PTA Pager: [phone]     [name] 02/03/2018, 2:11 PM

## 2018-02-03 NOTE — Progress Notes (Signed)
CSW following for discharge plan. CSW noting that PT evaluation yesterday has changed recommendations due to patient's progress. There continue to be no SNF beds available at any of the [place] contracted SNFs.  CSW met with patient to confirm that patient still has his bed available at [facility]. CSW contacted Administrator at [facility] and confirmed bed was still available for the patient. There will also be space available within [employer] for home health services to come and work with the patient.  CSW alerted RNCM. Patient no longer qualifies for SNF placement at this time, and there have been no beds available for the patient. CSW signing off.  Laveda Abbe, [place] Clinical Social Worker [phone]

## 2018-02-03 NOTE — Plan of Care (Signed)
Maintaining baseline min assist

## 2018-02-03 NOTE — Discharge Summary (Signed)
Physician Discharge Summary  Brian Huynh:[id number] DOB: Aug 10, 1960 DOA: 01/26/2018  PCP: [facility]  Admit date: 01/26/2018 Discharge date: 02/03/2018  Admitted From: Home Disposition:  Home  Recommendations for Outpatient Follow-up:  1. Follow up with PCP in 1-2 weeks 2. Follow up with Neurology in 4 weeks 3. Follow up with Vascular Surgery for L carotid endarterectomy on 8/20 4. Follow up with ID as scheduled  Home Health:PT   Discharge Condition:Stable CODE STATUS:Full Diet recommendation: Heart healthy   Brief/Interim Summary: 57 y.o. male with medical history significant for chronic anxiety, chronic depression, IV drug use who presented to [facility] with complaints of right-sided weakness.  Onset yesterday late afternoon.  While patient was standing doing dishes noted mild to moderate weakness affecting his right arm and right leg.  Was hoping the weakness will go away.  Went to bed and when he woke up this morning his right-sided weakness was significantly worse to the point where he fell at which point 911 was called.  Denies similar episodes in the past.  Family history with CVA in both his brothers in their early 57s.  Denies any chest pain or palpitations.  Denies difficulty with speech or swallowing. Also denies personal or family history of heart disease. ED Course: Upon presentation to the ED noted right upper arm weakness with inability to raise arms against gravity.  CT head with no contrast unremarkable for any acute intracranial findings.  MRI ordered and stroke neurology team consulted by ED physician.  Vital signs unremarkable. EKG in sinus rhythm.  No significant lab abnormalities  Stroke:bilateralMCA/ACA watershed, right MCA/PCA watershed and b/l ACA patchyinfarct, likely due to hypoperfusion from b/l high grade ICA stenosis. Neurology had been following, recommendations for DAPT with asa 325mg  and plavix75mg  daily for three months then single agent,  statin. He is to follow up with neurology in 4 weeks, neurology signed off on 8/7. Vascular surgery input appreciated, plan to return to have surgery  on 8/20, sbp goal prior to surgery keep at 130 -160  Elevation of lft/ hepatitic C Prior h/o hep c, spontaneuously resolved per patient Pt noted to be + for hepc with hcv 596000 IU/ml Patient aware that [facility] Decreased statin dose in the setting of elevated lft  Mild thrombocytopenia From alcohol? plt nadir at 126 Improving  Polysubstance abuse + alcohol + cigarettes  +reports recent use of heroin uds on presentation negative  Restless leg, insomnia  neurontin nightly started on 8/10   Discharge Diagnoses:  Active Problems:   CVA (cerebral vascular accident) ([place])   Tobacco abuse   Essential hypertension   IV drug abuse ([place])   Thrombocytopenia [facility])    Discharge Instructions  Discharge Instructions    Ambulatory referral to Neurology   Complete by:  As directed    Follow up with stroke clinic NP (Jessica Vanschaick or Cecille Rubin, if both not available, consider Zachery Dauer, or A[facility]) at [facility] in about 4 weeks. Thanks.     Allergies as of 02/03/2018      Reactions   Voltaren [diclofenac] Rash   Voltaren gel caused red bumps on the skin      Medication List    STOP taking these medications   cyclobenzaprine 10 MG tablet Commonly known as:  FLEXERIL   HYDROcodone-acetaminophen 5-325 MG tablet Commonly known as:  NORCO/VICODIN   ibuprofen 400 MG tablet Commonly known as:  ADVIL,MOTRIN   lidocaine 5 % Commonly  known as:  LIDODERM   naproxen sodium 220 MG tablet Commonly known as:  ALEVE   oxyCODONE 5 MG immediate release tablet Commonly known as:  Oxy IR/ROXICODONE   polyethylene glycol packet Commonly known as:  MIRALAX / GLYCOLAX     TAKE these medications   aspirin 325 MG tablet Take 1 tablet (325 mg total) by mouth daily. Start taking  on:  02/04/2018   atorvastatin 40 MG tablet Commonly known as:  LIPITOR Take 1 tablet (40 mg total) by mouth daily at 6 PM.   citalopram 40 MG tablet Commonly known as:  CELEXA Take 40 mg by mouth daily.   clopidogrel 75 MG tablet Commonly known as:  PLAVIX Take 1 tablet (75 mg total) by mouth daily. Start taking on:  02/04/2018   gabapentin 100 MG capsule Commonly known as:  NEURONTIN Take 2 capsules (200 mg total) by mouth at bedtime.   traZODone 100 MG tablet Commonly known as:  DESYREL Take 100 mg by mouth at bedtime.      Follow-up Information    [facility]. Schedule an appointment as soon as possible for a visit in 4 week(s).   Specialty:  Radiology Contact information: [address] [place] [phone]       [name], MD Follow up on 02/09/2018.   Specialties:  Vascular Surgery, Cardiology Contact information: [place] [address] [facility] [place] Follow up.   Why:  va doctor to refer you for hepatitic c testing and treatment. Contact information: [place] [address] [phone]        Home, [facility] Follow up.   Specialty:  Home Health Services Why:  Physical Therapy Contact information: [address] [address] [place] [address] [phone]          Allergies  Allergen Reactions  . Voltaren [Diclofenac] Rash    Voltaren gel caused red bumps on the skin    Consultations:  Neurology  Vascular surgery  Procedures/Studies: Ct Angio Head W Or Wo Contrast  Result Date: 01/27/2018 CLINICAL DATA:  Stroke follow-up EXAM: CT ANGIOGRAPHY HEAD AND NECK TECHNIQUE: Multidetector CT imaging of the head and neck was performed using the standard protocol during bolus administration of intravenous contrast. Multiplanar CT image reconstructions and MIPs were obtained to evaluate the  vascular anatomy. Carotid stenosis measurements (when applicable) are obtained utilizing NASCET criteria, using the distal internal carotid diameter as the denominator. CONTRAST:  17mL ISOVUE-370 IOPAMIDOL (ISOVUE-370) INJECTION 76% COMPARISON:  Brain MRI 01/26/2018 FINDINGS: CTA NECK FINDINGS AORTIC ARCH: There is minimal calcific atherosclerosis of the aortic arch. There is no aneurysm, dissection or hemodynamically significant stenosis of the visualized ascending aorta and aortic arch. Conventional 3 vessel aortic branching pattern. The visualized proximal subclavian arteries are widely patent. RIGHT CAROTID SYSTEM: --Common carotid artery: Widely patent origin without common carotid artery dissection or aneurysm. --Internal carotid artery: Prominently noncalcified plaque at the carotid bifurcation causes approximately 65% stenosis. --External carotid artery: No acute abnormality. LEFT CAROTID SYSTEM: --Common carotid artery: Widely patent origin without common carotid artery dissection or aneurysm. --Internal carotid artery:Predominantly noncalcified plaque at the left carotid bifurcation causes approximately 75% stenosis --External carotid artery: No acute abnormality. VERTEBRAL ARTERIES: Left dominant configuration. Both origins are normal. Right vertebral artery terminates in the right PICA. Left vertebral artery is normal to the vertebrobasilar confluence. SKELETON: There is no bony spinal  canal stenosis. No lytic or blastic lesion. OTHER NECK: Normal pharynx, larynx and major salivary glands. No cervical lymphadenopathy. Unremarkable thyroid gland. UPPER CHEST: No pneumothorax or pleural effusion. No nodules or masses. CTA HEAD FINDINGS ANTERIOR CIRCULATION: --Intracranial internal carotid arteries: Normal. --Anterior cerebral arteries: Normal. Both A1 segments are present. Patent anterior communicating artery. --Middle cerebral arteries: Normal. --Posterior communicating arteries: Present on the right.  POSTERIOR CIRCULATION: --Basilar artery: Normal. --Posterior cerebral arteries: There is multifocal severe stenosis of the right posterior cerebral artery with multiple short segments of occlusion. --Superior cerebellar arteries: Normal. --Inferior cerebellar arteries: Normal anterior and posterior inferior cerebellar arteries. VENOUS SINUSES: Please refer to report for dedicated CT venogram. ANATOMIC VARIANTS: Nondominant right vertebral artery terminates in PICA. DELAYED PHASE: Not performed Review of the MIP images confirms the above findings. IMPRESSION: 1. No emergent large vessel occlusion. 2. Bilateral proximal internal carotid artery stenosis, measuring 65% on the right and 75% on the left. 3. Multifocal severe stenosis of the right PCA with multiple short segment occlusions. 4.  Aortic Atherosclerosis (ICD10-I70.0). Electronically Signed   By: Ulyses Jarred M.D.   On: 01/27/2018 02:14   Ct Head Wo Contrast  Result Date: 01/26/2018 CLINICAL DATA:  CT head w/o contrast due to focal neuro deficit Pt [facility] via EMS; Last night around 2100 pt started experiencing weakness and numbness to R side; mechanical fall last night, hit head, no LOC, EXAM: CT HEAD WITHOUT CONTRAST TECHNIQUE: Contiguous axial images were obtained from the base of the skull through the vertex without intravenous contrast. COMPARISON:  None. FINDINGS: Brain: No acute intracranial hemorrhage. No focal mass lesion. No CT evidence of acute infarction. No midline shift or mass effect. No hydrocephalus. Basilar cisterns are patent. Subcortical white matter hypodensities in the high frontal lobes. Vascular: No hyperdense vessel or unexpected calcification. Skull: Normal. Negative for fracture or focal lesion. Sinuses/Orbits: Paranasal sinuses and mastoid air cells are clear. Orbits are clear. Other: None. IMPRESSION: 1. No acute intracranial findings. 2. White matter microvascular disease Electronically Signed   By: Suzy Bouchard M.D.   On: 01/26/2018 11:48   Ct Angio Neck W Or Wo Contrast  Result Date: 01/27/2018 CLINICAL DATA:  Stroke follow-up EXAM: CT ANGIOGRAPHY HEAD AND NECK TECHNIQUE: Multidetector CT imaging of the head and neck was performed using the standard protocol during bolus administration of intravenous contrast. Multiplanar CT image reconstructions and MIPs were obtained to evaluate the vascular anatomy. Carotid stenosis measurements (when applicable) are obtained utilizing NASCET criteria, using the distal internal carotid diameter as the denominator. CONTRAST:  76mL ISOVUE-370 IOPAMIDOL (ISOVUE-370) INJECTION 76% COMPARISON:  Brain MRI 01/26/2018 FINDINGS: CTA NECK FINDINGS AORTIC ARCH: There is minimal calcific atherosclerosis of the aortic arch. There is no aneurysm, dissection or hemodynamically significant stenosis of the visualized ascending aorta and aortic arch. Conventional 3 vessel aortic branching pattern. The visualized proximal subclavian arteries are widely patent. RIGHT CAROTID SYSTEM: --Common carotid artery: Widely patent origin without common carotid artery dissection or aneurysm. --Internal carotid artery: Prominently noncalcified plaque at the carotid bifurcation causes approximately 65% stenosis. --External carotid artery: No acute abnormality. LEFT CAROTID SYSTEM: --Common carotid artery: Widely patent origin without common carotid artery dissection or aneurysm. --Internal carotid artery:Predominantly noncalcified plaque at the left carotid bifurcation causes approximately 75% stenosis --External carotid artery: No acute abnormality. VERTEBRAL ARTERIES: Left dominant configuration. Both origins are normal. Right vertebral artery terminates in the right PICA. Left vertebral artery is normal to the vertebrobasilar confluence. SKELETON: There is no  bony spinal canal stenosis. No lytic or blastic lesion. OTHER NECK: Normal pharynx, larynx and major salivary glands. No cervical lymphadenopathy.  Unremarkable thyroid gland. UPPER CHEST: No pneumothorax or pleural effusion. No nodules or masses. CTA HEAD FINDINGS ANTERIOR CIRCULATION: --Intracranial internal carotid arteries: Normal. --Anterior cerebral arteries: Normal. Both A1 segments are present. Patent anterior communicating artery. --Middle cerebral arteries: Normal. --Posterior communicating arteries: Present on the right. POSTERIOR CIRCULATION: --Basilar artery: Normal. --Posterior cerebral arteries: There is multifocal severe stenosis of the right posterior cerebral artery with multiple short segments of occlusion. --Superior cerebellar arteries: Normal. --Inferior cerebellar arteries: Normal anterior and posterior inferior cerebellar arteries. VENOUS SINUSES: Please refer to report for dedicated CT venogram. ANATOMIC VARIANTS: Nondominant right vertebral artery terminates in PICA. DELAYED PHASE: Not performed Review of the MIP images confirms the above findings. IMPRESSION: 1. No emergent large vessel occlusion. 2. Bilateral proximal internal carotid artery stenosis, measuring 65% on the right and 75% on the left. 3. Multifocal severe stenosis of the right PCA with multiple short segment occlusions. 4.  Aortic Atherosclerosis (ICD10-I70.0). Electronically Signed   By: Ulyses Jarred M.D.   On: 01/27/2018 02:14   Mr Brain Wo Contrast  Result Date: 01/26/2018 CLINICAL DATA:  Acute onset RIGHT extremity weakness beginning yesterday. EXAM: MRI HEAD WITHOUT CONTRAST TECHNIQUE: Axial and coronal diffusion weighted imaging. Patient did not wish to continue examination and study was terminated. COMPARISON:  CT HEAD January 26, 2018 FINDINGS: INTRACRANIAL CONTENTS: Patchy reduced diffusion bilateral frontoparietal lobes with low ADC values on the LEFT, predominantly normalized ADC values on the RIGHT. Reduced diffusion RIGHT occipital lobe with T2 shine through. No midline shift or mass effect. Borderline parenchymal brain volume loss. VASCULAR:  Nondiagnostic. SKULL AND UPPER CERVICAL SPINE: No reduced diffusion within the calvarium to suggest hypercellular tumor. SINUSES/ORBITS: Nondiagnostic. OTHER: None. IMPRESSION: 1. Limited 2 sequence MRI head: Acute LEFT frontal infarcts. Subacute RIGHT frontal and biparietal infarcts. Constellation of findings concerning for vasculopathy or embolic phenomena, less likely PRES or venous infarcts. Given limited tolerance of MRI, consider repeat examination with sedation, MRA versus CTA head and neck. 2. Old RIGHT occipital lobe/PCA territory infarct. 3. Borderline parenchymal brain volume loss. Electronically Signed   By: Elon Alas M.D.   On: 01/26/2018 13:45   Ct Venogram Head  Result Date: 01/27/2018 CLINICAL DATA:  Right-sided numbness and weakness. EXAM: CT VENOGRAM HEAD TECHNIQUE: Following contrast administration, vena graphic images of the intracranial circulation were acquired and reformatted and coronal and sagittal planes. CONTRAST:  50mL ISOVUE-370 IOPAMIDOL (ISOVUE-370) INJECTION 76% COMPARISON:  None. FINDINGS: Superior sagittal sinus: Normal. Straight sinus: Normal. Inferior sagittal sinus, vein of Galen and internal cerebral veins: Normal. Transverse sinuses: Normal. Sigmoid sinuses: Normal. Visualized jugular veins: Normal. IMPRESSION: Normal venogram of the head. Electronically Signed   By: Ulyses Jarred M.D.   On: 01/27/2018 02:19     Subjective: Eager to be discharged  Discharge Exam: Vitals:   02/03/18 0820 02/03/18 1218  BP: 137/83 129/88  Pulse: 60 60  Resp:  16  Temp: 98.7 F (37.1 C) 97.6 F (36.4 C)  SpO2: 99% 100%   Vitals:   02/03/18 0342 02/03/18 0818 02/03/18 0820 02/03/18 1218  BP: 131/75 137/83 137/83 129/88  Pulse: (!) 51 60 60 60  Resp: 20 20  16   Temp: 98.6 F (37 C) 98.7 F (37.1 C) 98.7 F (37.1 C) 97.6 F (36.4 C)  TempSrc: Oral Oral Oral Oral  SpO2: 96% 99% 99% 100%  Weight:  Height:        General: Pt is alert, awake, not in acute  distress Cardiovascular: RRR, S1/S2 +, no rubs, no gallops Respiratory: CTA bilaterally, no wheezing, no rhonchi Abdominal: Soft, NT, ND, bowel sounds + Extremities: no edema, no cyanosis   The results of significant diagnostics from this hospitalization (including imaging, microbiology, ancillary and laboratory) are listed below for reference.     Microbiology: Recent Results (from the past 240 hour(s))  Culture, blood (routine x 2)     Status: None   Collection Time: 01/26/18  7:15 PM  Result Value Ref Range Status   Specimen Description BLOOD RIGHT THUMB  Final   Special Requests   Final    BOTTLES DRAWN AEROBIC AND ANAEROBIC Blood Culture adequate volume   Culture   Final    NO GROWTH 5 DAYS Performed at [facility], [address], [place], [place] [address]    Report Status 01/31/2018 FINAL  Final  Culture, blood (routine x 2)     Status: None   Collection Time: 01/26/18  7:20 PM  Result Value Ref Range Status   Specimen Description BLOOD RIGHT WRIST  Final   Special Requests   Final    BOTTLES DRAWN AEROBIC ONLY Blood Culture results may not be optimal due to an inadequate volume of blood received in culture bottles   Culture   Final    NO GROWTH 5 DAYS Performed at [facility], [place] [address], [place], [place] [address]    Report Status 01/31/2018 FINAL  Final     Labs: BNP (last 3 results) No results for input(s): BNP in the last 8760 hours. Basic Metabolic Panel: Recent Labs  Lab 01/28/18 0006 01/29/18 0922 02/01/18 0911  NA 141 141 139  K 4.0 4.1 4.0  CL 107 105 106  CO2 27 28 26   GLUCOSE 95 92 95  BUN 11 11 20   CREATININE 1.12 1.24 1.24  CALCIUM 8.4* 8.9 9.0  MG 2.1  --   --    Liver Function Tests: Recent Labs  Lab 01/28/18 0006 01/29/18 0922 02/01/18 0911  AST 61* 75* 86*  ALT 74* 89* 120*  ALKPHOS 60 65 71  BILITOT 1.0 1.1 1.4*  PROT 6.6 7.5 7.6  ALBUMIN 2.9* 3.3* 3.3*   No results for input(s): LIPASE, AMYLASE in the  last 168 hours. No results for input(s): AMMONIA in the last 168 hours. CBC: Recent Labs  Lab 01/28/18 0006 01/29/18 0922 02/01/18 0911  WBC 7.3 6.9 9.5  HGB 15.5 16.9 18.0*  HCT 46.1 50.1 52.2*  MCV 99.4 98.8 97.4  PLT 130* 131* 142*   Cardiac Enzymes: No results for input(s): CKTOTAL, CKMB, CKMBINDEX, TROPONINI in the last 168 hours. BNP: Invalid input(s): POCBNP CBG: No results for input(s): GLUCAP in the last 168 hours. D-Dimer No results for input(s): DDIMER in the last 72 hours. Hgb A1c No results for input(s): HGBA1C in the last 72 hours. Lipid Profile No results for input(s): CHOL, HDL, LDLCALC, TRIG, CHOLHDL, LDLDIRECT in the last 72 hours. Thyroid function studies No results for input(s): TSH, T4TOTAL, T3FREE, THYROIDAB in the last 72 hours.  Invalid input(s): FREET3 Anemia work up No results for input(s): VITAMINB12, FOLATE, FERRITIN, TIBC, IRON, RETICCTPCT in the last 72 hours. Urinalysis    Component Value Date/Time   COLORURINE YELLOW 01/26/2018 1055   APPEARANCEUR CLEAR 01/26/2018 1055   LABSPEC 1.011 01/26/2018 1055   PHURINE 6.0 01/26/2018 1055   GLUCOSEU NEGATIVE  01/26/2018 1055   HGBUR SMALL (A) 01/26/2018 1055   BILIRUBINUR NEGATIVE 01/26/2018 1055   KETONESUR 5 (A) 01/26/2018 1055   PROTEINUR NEGATIVE 01/26/2018 1055   UROBILINOGEN 1.0 10/29/2014 2052   NITRITE NEGATIVE 01/26/2018 1055   LEUKOCYTESUR NEGATIVE 01/26/2018 1055   Sepsis Labs Invalid input(s): PROCALCITONIN,  WBC,  LACTICIDVEN Microbiology Recent Results (from the past 240 hour(s))  Culture, blood (routine x 2)     Status: None   Collection Time: 01/26/18  7:15 PM  Result Value Ref Range Status   Specimen Description BLOOD RIGHT THUMB  Final   Special Requests   Final    BOTTLES DRAWN AEROBIC AND ANAEROBIC Blood Culture adequate volume   Culture   Final    NO GROWTH 5 DAYS Performed at [facility], [place] [address] [place], [place] [address]    Report Status  01/31/2018 FINAL  Final  Culture, blood (routine x 2)     Status: None   Collection Time: 01/26/18  7:20 PM  Result Value Ref Range Status   Specimen Description BLOOD RIGHT WRIST  Final   Special Requests   Final    BOTTLES DRAWN AEROBIC ONLY Blood Culture results may not be optimal due to an inadequate volume of blood received in culture bottles   Culture   Final    NO GROWTH 5 DAYS Performed at [facility], [place] [address] [place], [place] [address]    Report Status 01/31/2018 FINAL  Final   Time spent: 48min  SIGNED:   Marylu Lund, MD  [facility] 02/03/2018, 2:20 PM  If 7PM-7AM, please contact night-coverage [email or web address] Password [email or web address]

## 2018-02-05 ENCOUNTER — Other Ambulatory Visit: Payer: Self-pay | Admitting: *Deleted

## 2018-02-08 ENCOUNTER — Encounter (HOSPITAL_COMMUNITY): Payer: Self-pay | Admitting: *Deleted

## 2018-02-08 ENCOUNTER — Encounter (HOSPITAL_COMMUNITY): Payer: Self-pay

## 2018-02-08 ENCOUNTER — Other Ambulatory Visit: Payer: Self-pay

## 2018-02-08 NOTE — Progress Notes (Signed)
Spoke with pt for pre-op call. Pt denies cardiac history, chest pain or sob. Pt states he's not diabetic. Recently had a stroke.

## 2018-02-08 NOTE — Anesthesia Preprocedure Evaluation (Deleted)
Anesthesia Evaluation    Airway        Dental   Pulmonary Current Smoker,           Cardiovascular hypertension, + Peripheral Vascular Disease       Neuro/Psych  Headaches, Anxiety Depression  Neuromuscular disease CVA    GI/Hepatic negative GI ROS, (+) Hepatitis -, C  Endo/Other  negative endocrine ROS  Renal/GU negative Renal ROS     Musculoskeletal   Abdominal   Peds  Hematology   Anesthesia Other Findings   Reproductive/Obstetrics                             Lab Results  Component Value Date   WBC 9.5 02/01/2018   HGB 18.0 (H) 02/01/2018   HCT 52.2 (H) 02/01/2018   MCV 97.4 02/01/2018   PLT 142 (L) 02/01/2018   Lab Results  Component Value Date   CREATININE 1.24 02/01/2018   BUN 20 02/01/2018   NA 139 02/01/2018   K 4.0 02/01/2018   CL 106 02/01/2018   CO2 26 02/01/2018   Lab Results  Component Value Date   INR 1.11 01/26/2018   INR 0.94 10/29/2014   INR 1.1 (H) 05/13/2013   ECho:- Left ventricle: The cavity size was normal. Wall thickness was   increased in a pattern of mild LVH. Systolic function was normal.   The estimated ejection fraction was in the range of 55% to 60%.   Wall motion was normal; there were no regional wall motion   abnormalities. Doppler parameters are consistent with abnormal   left ventricular relaxation (grade 1 diastolic dysfunction). - Aortic valve: There was no stenosis. - Mitral valve: There was no significant regurgitation. - Right ventricle: The cavity size was normal. Systolic function   was normal. - Pulmonary arteries: No complete TR doppler jet so unable to   estimate PA systolic pressure. - Systemic veins: IVC measured 2.1 cm with > 50% respirophasic   variation, suggesting RA pressure 8 mmHg  Anesthesia Physical Anesthesia Plan  ASA: III  Anesthesia Plan: General   Post-op Pain Management:    Induction: Intravenous  PONV  Risk Score and Plan: 2 and Dexamethasone, Ondansetron and Midazolam  Airway Management Planned: Oral ETT  Additional Equipment: Arterial line  Intra-op Plan:   Post-operative Plan: Extubation in OR  Informed Consent:   Plan Discussed with: CRNA  Anesthesia Plan Comments:         Anesthesia Quick Evaluation

## 2018-02-09 ENCOUNTER — Ambulatory Visit (HOSPITAL_COMMUNITY)
Admission: RE | Admit: 2018-02-09 | Discharge: 2018-02-09 | Disposition: A | Discharge status: Home / Self Care | Payer: Non-veteran care | Source: Ambulatory Visit | Attending: Vascular Surgery | Admitting: Vascular Surgery

## 2018-02-09 ENCOUNTER — Other Ambulatory Visit: Payer: Self-pay | Admitting: *Deleted

## 2018-02-09 HISTORY — DX: Major depressive disorder, single episode, unspecified: F32.9

## 2018-02-09 HISTORY — DX: Anxiety disorder, unspecified: F41.9

## 2018-02-09 HISTORY — DX: Depression, unspecified: F32.A

## 2018-02-16 ENCOUNTER — Encounter (HOSPITAL_COMMUNITY)
Admission: RE | Disposition: A | Discharge status: Home / Self Care | Payer: Self-pay | Source: Home / Self Care | Attending: Vascular Surgery

## 2018-02-16 ENCOUNTER — Encounter (HOSPITAL_COMMUNITY): Payer: Self-pay | Admitting: *Deleted

## 2018-02-16 ENCOUNTER — Inpatient Hospital Stay (HOSPITAL_COMMUNITY): Payer: Non-veteran care | Admitting: Certified Registered Nurse Anesthetist

## 2018-02-16 ENCOUNTER — Inpatient Hospital Stay (HOSPITAL_COMMUNITY)
Admission: RE | Admit: 2018-02-16 | Discharge: 2018-02-17 | DRG: 039 | Disposition: A | Discharge status: Home / Self Care | Payer: Non-veteran care | Attending: Vascular Surgery | Admitting: Vascular Surgery

## 2018-02-16 DIAGNOSIS — Z8673 Personal history of transient ischemic attack (TIA), and cerebral infarction without residual deficits: Secondary | ICD-10-CM

## 2018-02-16 DIAGNOSIS — F418 Other specified anxiety disorders: Secondary | ICD-10-CM | POA: Diagnosis present

## 2018-02-16 DIAGNOSIS — I739 Peripheral vascular disease, unspecified: Secondary | ICD-10-CM | POA: Diagnosis present

## 2018-02-16 DIAGNOSIS — Z888 Allergy status to other drugs, medicaments and biological substances status: Secondary | ICD-10-CM

## 2018-02-16 DIAGNOSIS — B1920 Unspecified viral hepatitis C without hepatic coma: Secondary | ICD-10-CM | POA: Diagnosis present

## 2018-02-16 DIAGNOSIS — G2581 Restless legs syndrome: Secondary | ICD-10-CM | POA: Diagnosis present

## 2018-02-16 DIAGNOSIS — I6522 Occlusion and stenosis of left carotid artery: Secondary | ICD-10-CM | POA: Diagnosis not present

## 2018-02-16 DIAGNOSIS — F1010 Alcohol abuse, uncomplicated: Secondary | ICD-10-CM | POA: Diagnosis present

## 2018-02-16 DIAGNOSIS — F17210 Nicotine dependence, cigarettes, uncomplicated: Secondary | ICD-10-CM | POA: Diagnosis present

## 2018-02-16 HISTORY — PX: ENDARTERECTOMY: SHX5162

## 2018-02-16 HISTORY — PX: PATCH ANGIOPLASTY: SHX6230

## 2018-02-16 LAB — URINALYSIS, ROUTINE W REFLEX MICROSCOPIC
BILIRUBIN URINE: NEGATIVE
Glucose, UA: NEGATIVE mg/dL
Hgb urine dipstick: NEGATIVE
KETONES UR: NEGATIVE mg/dL
Leukocytes, UA: NEGATIVE
NITRITE: NEGATIVE
PROTEIN: NEGATIVE mg/dL
Specific Gravity, Urine: 1.018 (ref 1.005–1.030)
pH: 6 (ref 5.0–8.0)

## 2018-02-16 LAB — COMPREHENSIVE METABOLIC PANEL
ALBUMIN: 3.1 g/dL — AB (ref 3.5–5.0)
ALT: 88 U/L — ABNORMAL HIGH (ref 0–44)
AST: 52 U/L — ABNORMAL HIGH (ref 15–41)
Alkaline Phosphatase: 66 U/L (ref 38–126)
Anion gap: 7 (ref 5–15)
BILIRUBIN TOTAL: 1 mg/dL (ref 0.3–1.2)
BUN: 15 mg/dL (ref 6–20)
CO2: 25 mmol/L (ref 22–32)
Calcium: 9 mg/dL (ref 8.9–10.3)
Chloride: 107 mmol/L (ref 98–111)
Creatinine, Ser: 1.18 mg/dL (ref 0.61–1.24)
GFR calc Af Amer: >60 mL/min (ref >60)
GFR calc non Af Amer: >60 mL/min (ref >60)
GLUCOSE: 112 mg/dL — AB (ref 70–99)
POTASSIUM: 4.2 mmol/L (ref 3.5–5.1)
Sodium: 139 mmol/L (ref 135–145)
TOTAL PROTEIN: 7.1 g/dL (ref 6.5–8.1)

## 2018-02-16 LAB — SURGICAL PCR SCREEN
MRSA, PCR: NEGATIVE
Staphylococcus aureus: NEGATIVE

## 2018-02-16 LAB — CBC
HEMATOCRIT: 49.4 % (ref 39.0–52.0)
Hemoglobin: 16.4 g/dL (ref 13.0–17.0)
MCH: 32.7 pg (ref 26.0–34.0)
MCHC: 33.2 g/dL (ref 30.0–36.0)
MCV: 98.6 fL (ref 78.0–100.0)
Platelets: 158 10*3/uL (ref 150–400)
RBC: 5.01 MIL/uL (ref 4.22–5.81)
RDW: 12.4 % (ref 11.5–15.5)
WBC: 8.6 10*3/uL (ref 4.0–10.5)

## 2018-02-16 LAB — APTT: APTT: 32 s (ref 24–36)

## 2018-02-16 LAB — TYPE AND SCREEN
ABO/RH(D): A POS
ANTIBODY SCREEN: NEGATIVE

## 2018-02-16 LAB — ABO/RH: ABO/RH(D): A POS

## 2018-02-16 LAB — PROTIME-INR
INR: 1.07
Prothrombin Time: 13.8 seconds (ref 11.4–15.2)

## 2018-02-16 SURGERY — ENDARTERECTOMY, CAROTID
Anesthesia: General | Site: Neck | Laterality: Left

## 2018-02-16 SURGERY — ENDARTERECTOMY, CAROTID
Anesthesia: General | Laterality: Left

## 2018-02-16 MED ORDER — ACETAMINOPHEN 325 MG PO TABS: 325.0000 mg | ORAL_TABLET | ORAL | Status: DC | PRN

## 2018-02-16 MED ORDER — HYDROMORPHONE HCL 1 MG/ML IJ SOLN
0.2500 mg | INTRAMUSCULAR | Status: DC | PRN
  Administered 2018-02-16 (×2): 0.5 mg via INTRAVENOUS

## 2018-02-16 MED ORDER — DOCUSATE SODIUM 100 MG PO CAPS
100.0000 mg | ORAL_CAPSULE | Freq: Every day | ORAL | Status: DC
  Administered 2018-02-17: 100 mg via ORAL
  Filled 2018-02-16: qty 1

## 2018-02-16 MED ORDER — SODIUM CHLORIDE 0.9 % IV SOLN
INTRAVENOUS | Status: DC
  Administered 2018-02-16: 19:00:00 via INTRAVENOUS

## 2018-02-16 MED ORDER — ONDANSETRON HCL 4 MG/2ML IJ SOLN
INTRAMUSCULAR | Status: AC
  Filled 2018-02-16: qty 2

## 2018-02-16 MED ORDER — METOPROLOL TARTRATE 5 MG/5ML IV SOLN: 2.0000 mg | INTRAVENOUS | Status: DC | PRN

## 2018-02-16 MED ORDER — ASPIRIN 325 MG PO TABS
325.0000 mg | ORAL_TABLET | Freq: Every day | ORAL | Status: DC
  Administered 2018-02-17: 325 mg via ORAL
  Filled 2018-02-16: qty 1

## 2018-02-16 MED ORDER — POLYETHYLENE GLYCOL 3350 17 G PO PACK: 17.0000 g | PACK | Freq: Every day | ORAL | Status: DC | PRN

## 2018-02-16 MED ORDER — DEXAMETHASONE SODIUM PHOSPHATE 10 MG/ML IJ SOLN
INTRAMUSCULAR | Status: AC
  Filled 2018-02-16: qty 1

## 2018-02-16 MED ORDER — ACETAMINOPHEN 325 MG RE SUPP: 325.0000 mg | RECTAL | Status: DC | PRN

## 2018-02-16 MED ORDER — PROPOFOL 10 MG/ML IV BOLUS
INTRAVENOUS | Status: AC
  Filled 2018-02-16: qty 40

## 2018-02-16 MED ORDER — PROTAMINE SULFATE 10 MG/ML IV SOLN
INTRAVENOUS | Status: AC
  Filled 2018-02-16: qty 5

## 2018-02-16 MED ORDER — GABAPENTIN 100 MG PO CAPS
200.0000 mg | ORAL_CAPSULE | Freq: Every day | ORAL | Status: DC
  Administered 2018-02-16: 200 mg via ORAL
  Filled 2018-02-16: qty 2

## 2018-02-16 MED ORDER — SODIUM CHLORIDE 0.9 % IV SOLN
INTRAVENOUS | Status: DC | PRN
  Administered 2018-02-16: 25 ug/min via INTRAVENOUS

## 2018-02-16 MED ORDER — ONDANSETRON HCL 4 MG/2ML IJ SOLN: 4.0000 mg | Freq: Four times a day (QID) | INTRAMUSCULAR | Status: DC | PRN

## 2018-02-16 MED ORDER — PROPOFOL 10 MG/ML IV BOLUS
INTRAVENOUS | Status: DC | PRN
  Administered 2018-02-16: 200 mg via INTRAVENOUS

## 2018-02-16 MED ORDER — HEPARIN SODIUM (PORCINE) 1000 UNIT/ML IJ SOLN
INTRAMUSCULAR | Status: DC | PRN
  Administered 2018-02-16: 10000 [IU] via INTRAVENOUS

## 2018-02-16 MED ORDER — CHLORHEXIDINE GLUCONATE CLOTH 2 % EX PADS: 6.0000 | MEDICATED_PAD | Freq: Once | CUTANEOUS | Status: DC

## 2018-02-16 MED ORDER — SODIUM CHLORIDE 0.9 % IV SOLN
500.0000 mL | Freq: Once | INTRAVENOUS | Status: AC | PRN
  Administered 2018-02-16: 500 mL via INTRAVENOUS

## 2018-02-16 MED ORDER — LACTATED RINGERS IV SOLN
INTRAVENOUS | Status: DC | PRN
  Administered 2018-02-16: 11:00:00 via INTRAVENOUS

## 2018-02-16 MED ORDER — ROCURONIUM BROMIDE 50 MG/5ML IV SOSY
PREFILLED_SYRINGE | INTRAVENOUS | Status: AC
  Filled 2018-02-16: qty 5

## 2018-02-16 MED ORDER — LIDOCAINE HCL (PF) 1 % IJ SOLN
INTRAMUSCULAR | Status: AC
  Filled 2018-02-16: qty 5

## 2018-02-16 MED ORDER — HEPARIN SODIUM (PORCINE) 1000 UNIT/ML IJ SOLN
INTRAMUSCULAR | Status: AC
  Filled 2018-02-16: qty 1

## 2018-02-16 MED ORDER — FENTANYL CITRATE (PF) 250 MCG/5ML IJ SOLN
INTRAMUSCULAR | Status: AC
  Filled 2018-02-16: qty 5

## 2018-02-16 MED ORDER — PHENYLEPHRINE 40 MCG/ML (10ML) SYRINGE FOR IV PUSH (FOR BLOOD PRESSURE SUPPORT)
PREFILLED_SYRINGE | INTRAVENOUS | Status: AC
  Filled 2018-02-16: qty 10

## 2018-02-16 MED ORDER — 0.9 % SODIUM CHLORIDE (POUR BTL) OPTIME
TOPICAL | Status: DC | PRN
  Administered 2018-02-16: 2000 mL

## 2018-02-16 MED ORDER — HYDRALAZINE HCL 20 MG/ML IJ SOLN
INTRAMUSCULAR | Status: DC | PRN
  Administered 2018-02-16: 5 mg via INTRAVENOUS

## 2018-02-16 MED ORDER — PROTAMINE SULFATE 10 MG/ML IV SOLN
INTRAVENOUS | Status: DC | PRN
  Administered 2018-02-16 (×2): 25 mg via INTRAVENOUS

## 2018-02-16 MED ORDER — SODIUM CHLORIDE 0.9 % IV SOLN
INTRAVENOUS | Status: DC
  Administered 2018-02-16: 10:00:00 via INTRAVENOUS

## 2018-02-16 MED ORDER — MORPHINE SULFATE (PF) 2 MG/ML IV SOLN
2.0000 mg | INTRAVENOUS | Status: DC | PRN
  Administered 2018-02-16 – 2018-02-17 (×6): 2 mg via INTRAVENOUS
  Filled 2018-02-16 (×6): qty 1

## 2018-02-16 MED ORDER — ALUM & MAG HYDROXIDE-SIMETH 200-200-20 MG/5ML PO SUSP: 15.0000 mL | ORAL | Status: DC | PRN

## 2018-02-16 MED ORDER — PROMETHAZINE HCL 25 MG/ML IJ SOLN: 6.2500 mg | INTRAMUSCULAR | Status: DC | PRN

## 2018-02-16 MED ORDER — GLYCOPYRROLATE PF 0.2 MG/ML IJ SOSY
PREFILLED_SYRINGE | INTRAMUSCULAR | Status: AC
  Filled 2018-02-16: qty 1

## 2018-02-16 MED ORDER — PANTOPRAZOLE SODIUM 40 MG PO TBEC
40.0000 mg | DELAYED_RELEASE_TABLET | Freq: Every day | ORAL | Status: DC
  Administered 2018-02-16 – 2018-02-17 (×2): 40 mg via ORAL
  Filled 2018-02-16 (×2): qty 1

## 2018-02-16 MED ORDER — LIDOCAINE 2% (20 MG/ML) 5 ML SYRINGE
INTRAMUSCULAR | Status: DC | PRN
  Administered 2018-02-16: 100 mg via INTRAVENOUS

## 2018-02-16 MED ORDER — MIDAZOLAM HCL 2 MG/2ML IJ SOLN
INTRAMUSCULAR | Status: DC | PRN
  Administered 2018-02-16: 2 mg via INTRAVENOUS

## 2018-02-16 MED ORDER — HYDRALAZINE HCL 20 MG/ML IJ SOLN: 5.0000 mg | INTRAMUSCULAR | Status: DC | PRN

## 2018-02-16 MED ORDER — POTASSIUM CHLORIDE CRYS ER 20 MEQ PO TBCR: 20.0000 meq | EXTENDED_RELEASE_TABLET | Freq: Every day | ORAL | Status: DC | PRN

## 2018-02-16 MED ORDER — PHENOL 1.4 % MT LIQD: 1.0000 | OROMUCOSAL | Status: DC | PRN

## 2018-02-16 MED ORDER — SODIUM CHLORIDE 0.9 % IV SOLN
INTRAVENOUS | Status: DC | PRN
  Administered 2018-02-16: 12:00:00

## 2018-02-16 MED ORDER — CEFAZOLIN SODIUM-DEXTROSE 2-4 GM/100ML-% IV SOLN
2.0000 g | Freq: Three times a day (TID) | INTRAVENOUS | Status: AC
  Administered 2018-02-16 – 2018-02-17 (×2): 2 g via INTRAVENOUS
  Filled 2018-02-16 (×2): qty 100

## 2018-02-16 MED ORDER — GUAIFENESIN-DM 100-10 MG/5ML PO SYRP: 15.0000 mL | ORAL_SOLUTION | ORAL | Status: DC | PRN

## 2018-02-16 MED ORDER — MUPIROCIN 2 % EX OINT
1.0000 "application " | TOPICAL_OINTMENT | Freq: Once | CUTANEOUS | Status: AC
  Administered 2018-02-16: 1 via TOPICAL

## 2018-02-16 MED ORDER — DEXAMETHASONE SODIUM PHOSPHATE 10 MG/ML IJ SOLN
INTRAMUSCULAR | Status: DC | PRN
  Administered 2018-02-16: 10 mg via INTRAVENOUS

## 2018-02-16 MED ORDER — CEFAZOLIN SODIUM-DEXTROSE 2-4 GM/100ML-% IV SOLN
2.0000 g | INTRAVENOUS | Status: AC
  Administered 2018-02-16: 2 g via INTRAVENOUS
  Filled 2018-02-16: qty 100

## 2018-02-16 MED ORDER — LIDOCAINE 2% (20 MG/ML) 5 ML SYRINGE
INTRAMUSCULAR | Status: AC
  Filled 2018-02-16: qty 5

## 2018-02-16 MED ORDER — TRAZODONE HCL 100 MG PO TABS
100.0000 mg | ORAL_TABLET | Freq: Every day | ORAL | Status: DC
  Administered 2018-02-16: 100 mg via ORAL
  Filled 2018-02-16: qty 1

## 2018-02-16 MED ORDER — LABETALOL HCL 5 MG/ML IV SOLN: 10.0000 mg | INTRAVENOUS | Status: DC | PRN

## 2018-02-16 MED ORDER — FENTANYL CITRATE (PF) 250 MCG/5ML IJ SOLN
INTRAMUSCULAR | Status: DC | PRN
  Administered 2018-02-16: 100 ug via INTRAVENOUS
  Administered 2018-02-16 (×2): 50 ug via INTRAVENOUS
  Administered 2018-02-16: 150 ug via INTRAVENOUS

## 2018-02-16 MED ORDER — ONDANSETRON HCL 4 MG/2ML IJ SOLN
INTRAMUSCULAR | Status: DC | PRN
  Administered 2018-02-16: 4 mg via INTRAVENOUS

## 2018-02-16 MED ORDER — GLYCOPYRROLATE 0.2 MG/ML IJ SOLN
INTRAMUSCULAR | Status: DC | PRN
  Administered 2018-02-16: 0.2 mg via INTRAVENOUS

## 2018-02-16 MED ORDER — SODIUM CHLORIDE 0.9 % IV SOLN
INTRAVENOUS | Status: AC
  Filled 2018-02-16: qty 1.2

## 2018-02-16 MED ORDER — MUPIROCIN 2 % EX OINT
TOPICAL_OINTMENT | CUTANEOUS | Status: AC
  Filled 2018-02-16: qty 22

## 2018-02-16 MED ORDER — ROCURONIUM BROMIDE 50 MG/5ML IV SOSY
PREFILLED_SYRINGE | INTRAVENOUS | Status: AC
  Filled 2018-02-16: qty 10

## 2018-02-16 MED ORDER — MIDAZOLAM HCL 2 MG/2ML IJ SOLN
INTRAMUSCULAR | Status: AC
  Filled 2018-02-16: qty 2

## 2018-02-16 MED ORDER — ATORVASTATIN CALCIUM 40 MG PO TABS
40.0000 mg | ORAL_TABLET | Freq: Every day | ORAL | Status: DC
  Administered 2018-02-16: 40 mg via ORAL
  Filled 2018-02-16: qty 1

## 2018-02-16 MED ORDER — LABETALOL HCL 5 MG/ML IV SOLN
INTRAVENOUS | Status: AC
  Filled 2018-02-16: qty 4

## 2018-02-16 MED ORDER — BISACODYL 10 MG RE SUPP: 10.0000 mg | Freq: Every day | RECTAL | Status: DC | PRN

## 2018-02-16 MED ORDER — HEMOSTATIC AGENTS (NO CHARGE) OPTIME
TOPICAL | Status: DC | PRN
  Administered 2018-02-16: 1 via TOPICAL

## 2018-02-16 MED ORDER — HYDROMORPHONE HCL 1 MG/ML IJ SOLN
INTRAMUSCULAR | Status: AC
  Filled 2018-02-16: qty 1

## 2018-02-16 MED ORDER — PHENYLEPHRINE 40 MCG/ML (10ML) SYRINGE FOR IV PUSH (FOR BLOOD PRESSURE SUPPORT)
PREFILLED_SYRINGE | INTRAVENOUS | Status: DC | PRN
  Administered 2018-02-16: 120 ug via INTRAVENOUS

## 2018-02-16 MED ORDER — SUGAMMADEX SODIUM 200 MG/2ML IV SOLN
INTRAVENOUS | Status: DC | PRN
  Administered 2018-02-16: 200 mg via INTRAVENOUS

## 2018-02-16 MED ORDER — DEXMEDETOMIDINE HCL IN NACL 200 MCG/50ML IV SOLN
INTRAVENOUS | Status: DC | PRN
  Administered 2018-02-16: .5 ug/kg/h via INTRAVENOUS

## 2018-02-16 MED ORDER — CITALOPRAM HYDROBROMIDE 20 MG PO TABS
40.0000 mg | ORAL_TABLET | Freq: Every day | ORAL | Status: DC
  Administered 2018-02-17: 40 mg via ORAL
  Filled 2018-02-16 (×2): qty 2

## 2018-02-16 MED ORDER — ROCURONIUM BROMIDE 10 MG/ML (PF) SYRINGE
PREFILLED_SYRINGE | INTRAVENOUS | Status: DC | PRN
  Administered 2018-02-16: 50 mg via INTRAVENOUS
  Administered 2018-02-16: 20 mg via INTRAVENOUS

## 2018-02-16 MED ORDER — CLOPIDOGREL BISULFATE 75 MG PO TABS
75.0000 mg | ORAL_TABLET | Freq: Every day | ORAL | Status: DC
  Administered 2018-02-17: 75 mg via ORAL
  Filled 2018-02-16: qty 1

## 2018-02-16 MED ORDER — MAGNESIUM SULFATE 2 GM/50ML IV SOLN: 2.0000 g | Freq: Every day | INTRAVENOUS | Status: DC | PRN

## 2018-02-16 SURGICAL SUPPLY — 59 items
ADH SKN CLS APL DERMABOND .7 (GAUZE/BANDAGES/DRESSINGS) ×4
ADPR TBG 2 MALE LL ART (MISCELLANEOUS)
CANISTER SUCT 3000ML PPV (MISCELLANEOUS) ×4 IMPLANT
CATH ROBINSON RED A/P 18FR (CATHETERS) ×4 IMPLANT
CLIP VESOCCLUDE MED 24/CT (CLIP) ×4 IMPLANT
CLIP VESOCCLUDE SM WIDE 24/CT (CLIP) ×4 IMPLANT
CONT SPEC 4OZ CLIKSEAL STRL BL (MISCELLANEOUS) ×2 IMPLANT
COVER PROBE W GEL 5X96 (DRAPES) IMPLANT
CRADLE DONUT ADULT HEAD (MISCELLANEOUS) ×4 IMPLANT
DERMABOND ADVANCED (GAUZE/BANDAGES/DRESSINGS) ×4
DERMABOND ADVANCED .7 DNX12 (GAUZE/BANDAGES/DRESSINGS) ×2 IMPLANT
DRAIN CHANNEL 15F RND FF W/TCR (WOUND CARE) IMPLANT
ELECT REM PT RETURN 9FT ADLT (ELECTROSURGICAL) ×4
ELECTRODE REM PT RTRN 9FT ADLT (ELECTROSURGICAL) ×2 IMPLANT
EVACUATOR SILICONE 100CC (DRAIN) IMPLANT
GLOVE BIO SURGEON STRL SZ 6.5 (GLOVE) ×2 IMPLANT
GLOVE BIO SURGEON STRL SZ7.5 (GLOVE) ×4 IMPLANT
GLOVE BIO SURGEONS STRL SZ 6.5 (GLOVE) ×2
GLOVE BIOGEL PI IND STRL 6.5 (GLOVE) IMPLANT
GLOVE BIOGEL PI IND STRL 7.0 (GLOVE) IMPLANT
GLOVE BIOGEL PI INDICATOR 6.5 (GLOVE) ×2
GLOVE BIOGEL PI INDICATOR 7.0 (GLOVE) ×2
GLOVE SURG SS PI 6.5 STRL IVOR (GLOVE) ×4 IMPLANT
GOWN STRL REUS W/ TWL LRG LVL3 (GOWN DISPOSABLE) ×4 IMPLANT
GOWN STRL REUS W/ TWL XL LVL3 (GOWN DISPOSABLE) ×2 IMPLANT
GOWN STRL REUS W/TWL LRG LVL3 (GOWN DISPOSABLE) ×12
GOWN STRL REUS W/TWL XL LVL3 (GOWN DISPOSABLE) ×4
HEMOSTAT SNOW SURGICEL 2X4 (HEMOSTASIS) ×2 IMPLANT
INSERT FOGARTY SM (MISCELLANEOUS) ×4 IMPLANT
IV ADAPTER SYR DOUBLE MALE LL (MISCELLANEOUS) IMPLANT
KIT BASIN OR (CUSTOM PROCEDURE TRAY) ×4 IMPLANT
KIT SHUNT ARGYLE CAROTID ART 6 (VASCULAR PRODUCTS) ×2 IMPLANT
KIT TURNOVER KIT B (KITS) ×4 IMPLANT
NDL HYPO 25GX1X1/2 BEV (NEEDLE) IMPLANT
NDL SPNL 20GX3.5 QUINCKE YW (NEEDLE) IMPLANT
NEEDLE HYPO 25GX1X1/2 BEV (NEEDLE) IMPLANT
NEEDLE SPNL 20GX3.5 QUINCKE YW (NEEDLE) IMPLANT
NS IRRIG 1000ML POUR BTL (IV SOLUTION) ×12 IMPLANT
PACK CAROTID (CUSTOM PROCEDURE TRAY) ×4 IMPLANT
PAD ARMBOARD 7.5X6 YLW CONV (MISCELLANEOUS) ×8 IMPLANT
PATCH VASC XENOSURE 1CMX6CM (Vascular Products) ×4 IMPLANT
PATCH VASC XENOSURE 1X6 (Vascular Products) IMPLANT
SHUNT CAROTID BYPASS 10 (VASCULAR PRODUCTS) IMPLANT
SHUNT CAROTID BYPASS 12FRX15.5 (VASCULAR PRODUCTS) IMPLANT
STOPCOCK 4 WAY LG BORE MALE ST (IV SETS) IMPLANT
SUT ETHILON 3 0 PS 1 (SUTURE) IMPLANT
SUT MNCRL AB 4-0 PS2 18 (SUTURE) ×4 IMPLANT
SUT PROLENE 6 0 BV (SUTURE) ×4 IMPLANT
SUT PROLENE 7 0 BV1 MDA (SUTURE) ×2 IMPLANT
SUT SILK 3 0 (SUTURE)
SUT SILK 3-0 18XBRD TIE 12 (SUTURE) IMPLANT
SUT VIC AB 2-0 CT1 27 (SUTURE) ×4
SUT VIC AB 2-0 CT1 TAPERPNT 27 (SUTURE) ×2 IMPLANT
SUT VIC AB 3-0 SH 27 (SUTURE) ×4
SUT VIC AB 3-0 SH 27X BRD (SUTURE) ×2 IMPLANT
SYR CONTROL 10ML LL (SYRINGE) IMPLANT
TOWEL GREEN STERILE (TOWEL DISPOSABLE) ×4 IMPLANT
TUBING ART PRESS 48 MALE/FEM (TUBING) IMPLANT
WATER STERILE IRR 1000ML POUR (IV SOLUTION) ×4 IMPLANT

## 2018-02-16 NOTE — Progress Notes (Signed)
Patient's incision oozing distally. Open area at the base, cleaned with normal saline, gauze placed over area. Slight puffiness by upper part of incision lateral side. Patient c/o pain in the area. Morphine given. Will continue to monitor.

## 2018-02-16 NOTE — Progress Notes (Signed)
Pt received from PACU. VSS. CHG complete. Telemetry. Pt oriented to room and unit. Will continue to monitor.  Clyde Canterbury, RN

## 2018-02-16 NOTE — Anesthesia Procedure Notes (Signed)
Procedure Name: Intubation Date/Time: 02/16/2018 11:19 AM Performed by: Duane Boston, MD Pre-anesthesia Checklist: Patient identified, Emergency Drugs available, Suction available and Patient being monitored Patient Re-evaluated:Patient Re-evaluated prior to induction Oxygen Delivery Method: Circle System Utilized Preoxygenation: Pre-oxygenation with 100% oxygen Induction Type: IV induction Ventilation: Oral airway inserted - appropriate to patient size and Mask ventilation without difficulty Laryngoscope Size: Mac and 4 Grade View: Grade I Tube type: Oral Tube size: 7.5 mm Number of attempts: 1 Airway Equipment and Method: Stylet and Oral airway Placement Confirmation: ETT inserted through vocal cords under direct vision,  positive ETCO2 and breath sounds checked- equal and bilateral Tube secured with: Tape Dental Injury: Teeth and Oropharynx as per pre-operative assessment  Comments: DL by Darrell Jewel, SRNA

## 2018-02-16 NOTE — Discharge Instructions (Signed)
° °  [facility] ° °Discharge Instructions °  °Carotid Endarterectomy (CEA) ° °Please refer to the following instructions for your post-procedure care. Your surgeon or physician assistant will discuss any changes with you. ° °Activity ° °You are encouraged to walk as much as you can. You can slowly return to normal activities but must avoid strenuous activity and heavy lifting until your doctor tell you it's okay. Avoid activities such as vacuuming or swinging a golf club. You can drive after one week if you are comfortable and you are no longer taking prescription pain medications. It is normal to feel tired for serval weeks after your surgery. It is also normal to have difficulty with sleep habits, eating, and bowel movements after surgery. These will go away with time. ° °Bathing/Showering ° °Shower daily after you go home. Do not soak in a bathtub, hot tub, or swim until the incision heals completely. ° °Incision Care ° °Shower every day. Clean your incision with mild soap and water. Pat the area dry with a clean towel. You do not need a bandage unless otherwise instructed. Do not apply any ointments or creams to your incision. You may have skin glue on your incision. Do not peel it off. It will come off on its own in about one week. Your incision may feel thickened and raised for several weeks after your surgery. This is normal and the skin will soften over time.  ° °For Men Only: It's okay to shave around the incision but do not shave the incision itself for 2 weeks. It is common to have numbness under your chin that could last for several months. ° °Diet ° °Resume your normal diet. There are no special food restrictions following this procedure. A low fat/low cholesterol diet is recommended for all patients with vascular disease. In order to heal from your surgery, it is CRITICAL to get adequate nutrition. Your body requires vitamins, minerals, and protein. Vegetables are the  best source of vitamins and minerals. Vegetables also provide the perfect balance of protein. Processed food has little nutritional value, so try to avoid this. ° °Medications ° °Resume taking all of your medications unless your doctor or physician assistant tells you not to. If your incision is causing pain, you may take over-the- counter pain relievers such as acetaminophen (Tylenol). If you were prescribed a stronger pain medication, please be aware these medications can cause nausea and constipation. Prevent nausea by taking the medication with a snack or meal. Avoid constipation by drinking plenty of fluids and eating foods with a high amount of fiber, such as fruits, vegetables, and grains.  °Do not take Tylenol if you are taking prescription pain medications. ° °Follow Up ° °Our office will schedule a follow up appointment 2-3 weeks following discharge. ° °Please call us immediately for any of the following conditions ° °Increased pain, redness, drainage (pus) from your incision site. °Fever of 101 degrees or higher. °If you should develop stroke (slurred speech, difficulty swallowing, weakness on one side of your body, loss of vision) you should call 911 and go to the nearest emergency room. ° °Reduce your risk of vascular disease: ° °Stop smoking. If you would like help call [facility] at 1-800-QUIT-NOW (1-800-784-8669) or [facility] at 336-586-4000. °Manage your cholesterol °Maintain a desired weight °Control your diabetes °Keep your blood pressure down ° °If you have any questions, please call the office at [phone]. ° °

## 2018-02-16 NOTE — Anesthesia Postprocedure Evaluation (Signed)
Anesthesia Post Note  Patient: Brian Huynh  Procedure(s) Performed: Left Carotid ENDARTERECTOMY (Left Neck) PATCH ANGIOPLASTY (Neck)     Patient location during evaluation: PACU Anesthesia Type: General Level of consciousness: sedated Pain management: pain level controlled Vital Signs Assessment: post-procedure vital signs reviewed and stable Respiratory status: spontaneous breathing and respiratory function stable Cardiovascular status: stable Postop Assessment: no apparent nausea or vomiting Anesthetic complications: no    Last Vitals:  Vitals:   02/16/18 1445 02/16/18 1457  BP: 92/61 (!) 87/59  Pulse: (!) 59 (!) 59  Resp: 14 16  Temp:    SpO2: 98% 98%                 Jw Covin DANIEL

## 2018-02-16 NOTE — Anesthesia Procedure Notes (Signed)
Arterial Line Insertion Performed by: White, Amedeo Plenty, Immunologist, CRNA  Preanesthetic checklist: patient identified, site marked, risks and benefits discussed, surgical consent and timeout performed Lidocaine 1% used for infiltration Left, radial was placed Catheter size: 20 G Hand hygiene performed  and maximum sterile barriers used  Allen's test indicative of satisfactory collateral circulation Attempts: 1 Procedure performed without using ultrasound guided technique. Following insertion, dressing applied and Biopatch. Post procedure assessment: normal  Patient tolerated the procedure well with no immediate complications. Additional procedure comments: Placed by Rexene Edison .

## 2018-02-16 NOTE — H&P (Signed)
   History and Physical Update  The patient was interviewed and re-examined.  The patient's previous History and Physical has been reviewed and is unchanged from recent hospitalization. Plan for left cea.   Uzma Hellmer C. Donzetta Matters, MD [facility] Office: [phone] Pager: [phone]   02/16/2018, 10:35 AM

## 2018-02-16 NOTE — Anesthesia Preprocedure Evaluation (Addendum)
Anesthesia Evaluation  Patient identified by MRN, date of birth, ID band Patient awake    Reviewed: Allergy & Precautions, NPO status , Patient's Chart, lab work & pertinent test results  History of Anesthesia Complications Negative for: history of anesthetic complications  Airway Mallampati: I  TM Distance: >3 FB Neck ROM: Full    Dental  (+) Edentulous Upper, Edentulous Lower, Dental Advisory Given   Pulmonary Current Smoker,    Pulmonary exam normal        Cardiovascular hypertension, + Peripheral Vascular Disease  Normal cardiovascular exam  Impressions:  - Normal LV size with mild LV hypertrophy. EF 55-60%. Normal RV   size and systolic function. No significant valvular   abnormalities.   Neuro/Psych  Headaches, PSYCHIATRIC DISORDERS Anxiety Depression  Neuromuscular disease CVA    GI/Hepatic negative GI ROS, (+) Hepatitis -, C  Endo/Other  negative endocrine ROS  Renal/GU negative Renal ROS     Musculoskeletal   Abdominal   Peds  Hematology   Anesthesia Other Findings   Reproductive/Obstetrics                            Lab Results  Component Value Date   WBC 9.5 02/01/2018   HGB 18.0 (H) 02/01/2018   HCT 52.2 (H) 02/01/2018   MCV 97.4 02/01/2018   PLT 142 (L) 02/01/2018   Lab Results  Component Value Date   CREATININE 1.24 02/01/2018   BUN 20 02/01/2018   NA 139 02/01/2018   K 4.0 02/01/2018   CL 106 02/01/2018   CO2 26 02/01/2018   Lab Results  Component Value Date   INR 1.11 01/26/2018   INR 0.94 10/29/2014   INR 1.1 (H) 05/13/2013   ECho:- Left ventricle: The cavity size was normal. Wall thickness was   increased in a pattern of mild LVH. Systolic function was normal.   The estimated ejection fraction was in the range of 55% to 60%.   Wall motion was normal; there were no regional wall motion   abnormalities. Doppler parameters are consistent with abnormal  left ventricular relaxation (grade 1 diastolic dysfunction). - Aortic valve: There was no stenosis. - Mitral valve: There was no significant regurgitation. - Right ventricle: The cavity size was normal. Systolic function   was normal. - Pulmonary arteries: No complete TR doppler jet so unable to   estimate PA systolic pressure. - Systemic veins: IVC measured 2.1 cm with > 50% respirophasic   variation, suggesting RA pressure 8 mmHg  Anesthesia Physical  Anesthesia Plan  ASA: III  Anesthesia Plan: General   Post-op Pain Management:    Induction: Intravenous  PONV Risk Score and Plan: 3 and Dexamethasone, Ondansetron and Diphenhydramine  Airway Management Planned: Oral ETT  Additional Equipment: Arterial line  Intra-op Plan:   Post-operative Plan: Extubation in OR  Informed Consent: I have reviewed the patients History and Physical, chart, labs and discussed the procedure including the risks, benefits and alternatives for the proposed anesthesia with the patient or authorized representative who has indicated his/her understanding and acceptance.   Dental advisory given  Plan Discussed with: CRNA and Anesthesiologist  Anesthesia Plan Comments:        Anesthesia Quick Evaluation

## 2018-02-16 NOTE — Op Note (Signed)
    Patient name: Brian Huynh MRN: [id number] DOB: 12-19-1960 Sex: male  02/16/2018 Pre-operative Diagnosis: Symptomatic left carotid stenosis Post-operative diagnosis:  Same Surgeon:  Erlene Quan C. Donzetta Matters, MD Assistant: Leontine Locket, PA Procedure Performed: Left carotid endarterectomy with bovine pericardial patch angioplasty  Indications: 57 year old male with recent left brain stroke found to have bilateral high-grade carotid artery stenosis.  He is indicated for left carotid endarterectomy.  Findings: There was very tight stenosis with soft plaque at the carotid bifurcation extending up into the internal carotid artery.  The external and distal, was actually free of disease.  I completion there were expected signals with flow in diastole in the internal carotid artery.   Procedure:  The patient was identified in the holding area and taken to the operating room where is placed supine on the operating table sterilely prepped and draped in the left neck in the usual fashion he was given antibiotics and a timeout was called.  We began with a longitudinal incision along the anterior border the sternocleidomastoid.  We dissected down first identified the jugular vein and divided multiple facial vein branches between ties.  We identified the common carotid artery placed a umbilical tape around this administer 10,000 units of heparin and ACT ultimately returned greater than 300.  We dissected up onto the external carotid artery placed a silk tie around superior thyroidal branch and then a vessel loop around the external.  We then identified our hypoglossal nerve protected this as well as her vagus nerve.  We dissected up closely on the internal carotid artery placed a vessel loop around this.  We prepared a 10 [place] shunt.  Pressure was confirmed systolic 201 and ACT had returned.  We clamped our internal carotid artery followed by our common and external.  We open the artery longitudinally first with 11  blade followed by Potts scissors up until the artery appeared normal.  We then placed a 10 [place] shunt distally in the internal carotid artery allowed backbleeding and placed it into the common carotid artery.  We confirm flow with Doppler.  Endarterectomy was performed we did have smooth tapering distally but there was one small flap which was tacked with interrupted 7-0 Prolene sutures.  We then irrigated with heparinized saline trimmed a bovine pericardial patch to size and sewn in place with 6-0 Prolene suture.  Prior to completing anastomosis we removed our shunt and flushed in all directions and again irrigated with hep saline.  We then completed the anastomosis unclamped external followed by common carotid arteries and after several cardiac cycles are internal.  We confirmed with Doppler good flow throughout diastole in the internal carotid artery we also flow into the external and expected signals throughout.  We did place one repair suture with 7-0 Prolene suture in a U configuration.  We administered 50 mg of protamine obtain hemostasis and the wound irrigated and closed in layers with the platysma 2-0 Vicryl and the skin with 4 Monocryl.  Dermabond was placed to level the skin.  All counts were correct at completion.  He awakened from anesthesia was neurologically intact transfer the PACU in stable condition.   EBL: 150 cc.    Jennie Bolar C. Donzetta Matters, MD [facility] Office: [phone] Pager: [phone]

## 2018-02-16 NOTE — Progress Notes (Signed)
SBP 90-98 by cuff, map 70-72. Art line waveform has become increasingly dampened . Dr Donzetta Matters at bedside & aware. Pt is awake, alert, mentating clearly.

## 2018-02-16 NOTE — Transfer of Care (Signed)
Immediate Anesthesia Transfer of Care Note  Patient: Brian Huynh  Procedure(s) Performed: Left Carotid ENDARTERECTOMY (Left Neck) PATCH ANGIOPLASTY (Neck)  Patient Location: PACU  Anesthesia Type:General  Level of Consciousness: awake, alert  and oriented  Airway & Oxygen Therapy: Patient Spontanous Breathing and Patient connected to nasal cannula oxygen  Post-op Assessment: Report given to RN, Post -op Vital signs reviewed and stable, Patient moving all extremities X 4 and Patient able to stick tongue midline  Post vital signs: Reviewed and stable  Last Vitals:  Vitals Value Taken Time  BP 105/76 02/16/2018  1:22 PM  Temp 36.6 C 02/16/2018  1:26 PM  Pulse 69 02/16/2018  1:33 PM  Resp 12 02/16/2018  1:33 PM  SpO2 99 % 02/16/2018  1:33 PM  Vitals shown include unvalidated device data.  Last Pain:  Vitals:   02/16/18 1326  TempSrc:   PainSc: 0-No pain      Patients Stated Pain Goal: 3 (31/54/00 8676)  Complications: No apparent anesthesia complications

## 2018-02-17 ENCOUNTER — Other Ambulatory Visit: Payer: Self-pay

## 2018-02-17 ENCOUNTER — Encounter (HOSPITAL_COMMUNITY): Payer: Self-pay | Admitting: General Practice

## 2018-02-17 ENCOUNTER — Telehealth: Payer: Self-pay | Admitting: Vascular Surgery

## 2018-02-17 LAB — BASIC METABOLIC PANEL
ANION GAP: 8 (ref 5–15)
BUN: 15 mg/dL (ref 6–20)
CHLORIDE: 108 mmol/L (ref 98–111)
CO2: 22 mmol/L (ref 22–32)
Calcium: 7.8 mg/dL — ABNORMAL LOW (ref 8.9–10.3)
Creatinine, Ser: 1.11 mg/dL (ref 0.61–1.24)
Glucose, Bld: 163 mg/dL — ABNORMAL HIGH (ref 70–99)
POTASSIUM: 4.6 mmol/L (ref 3.5–5.1)
SODIUM: 138 mmol/L (ref 135–145)

## 2018-02-17 LAB — POCT ACTIVATED CLOTTING TIME
ACTIVATED CLOTTING TIME: 241 s
Activated Clotting Time: 345 seconds

## 2018-02-17 LAB — CBC
HCT: 43.2 % (ref 39.0–52.0)
HEMOGLOBIN: 14.2 g/dL (ref 13.0–17.0)
MCH: 33.3 pg (ref 26.0–34.0)
MCHC: 32.9 g/dL (ref 30.0–36.0)
MCV: 101.4 fL — ABNORMAL HIGH (ref 78.0–100.0)
PLATELETS: 157 10*3/uL (ref 150–400)
RBC: 4.26 MIL/uL (ref 4.22–5.81)
RDW: 12.5 % (ref 11.5–15.5)
WBC: 16.4 10*3/uL — AB (ref 4.0–10.5)

## 2018-02-17 MED ORDER — OXYCODONE-ACETAMINOPHEN 5-325 MG PO TABS: 1.0000 | ORAL_TABLET | Freq: Four times a day (QID) | ORAL | 0 refills | Status: DC | PRN

## 2018-02-17 NOTE — Telephone Encounter (Signed)
sch appt spk to pt son 03/19/18 830am f/u MD

## 2018-02-17 NOTE — Progress Notes (Addendum)
  Progress Note    02/17/2018 7:19 AM 1 Day Post-Op  Subjective:  Pt says he's a little tired this morning.  Says his throat is sore but no trouble swallowing.  He says he has walked and voided.  Afebrile HR 50's-60's NSR 45'O-592'T systolic 24% RA  Vitals:   02/16/18 2004 02/17/18 0334  BP: 92/62 119/72  Pulse: (!) 59 (!) 58  Resp: 15 16  Temp: 97.9 F (36.6 C) 97.9 F (36.6 C)  SpO2: 98% 97%     Physical Exam: Neuro:  In tact; moving all 4 extremities equally; tongue is midline; mild marginal mandibular neuropraxia Lungs:  Non labored Incision:  Mild fullness at incision; incision is clean and dry  CBC    Component Value Date/Time   WBC 16.4 (H) 02/17/2018 0458   RBC 4.26 02/17/2018 0458   HGB 14.2 02/17/2018 0458   HCT 43.2 02/17/2018 0458   PLT 157 02/17/2018 0458   MCV 101.4 (H) 02/17/2018 0458   MCH 33.3 02/17/2018 0458   MCHC 32.9 02/17/2018 0458   RDW 12.5 02/17/2018 0458   LYMPHSABS 1.4 01/26/2018 1102   MONOABS 0.9 01/26/2018 1102   EOSABS 0.2 01/26/2018 1102   BASOSABS 0.0 01/26/2018 1102    BMET    Component Value Date/Time   NA 138 02/17/2018 0458   K 4.6 02/17/2018 0458   CL 108 02/17/2018 0458   CO2 22 02/17/2018 0458   GLUCOSE 163 (H) 02/17/2018 0458   BUN 15 02/17/2018 0458   CREATININE 1.11 02/17/2018 0458   CALCIUM 7.8 (L) 02/17/2018 0458   GFRNONAA >60 02/17/2018 0458   GFRAA >60 02/17/2018 0458     Intake/Output Summary (Last 24 hours) at 02/17/2018 0719 Last data filed at 02/17/2018 0456 Gross per 24 hour  Intake 2989.45 ml  Output 400 ml  Net 2589.45 ml     Assessment/Plan:  This is a 57 y.o. male who is s/p left CEA 1 Day Post-Op  -pt is doing well this am. -pt neuro exam is in tact-he does have a slight marginal mandibular neurpraxia -pt has ambulated -pt has voided -restart plavix/asa today -f/u with Dr. Donzetta Matters in 2 weeks.   Leontine Locket, PA-C [facility] [phone]   I have  independently interviewed and examined patient and agree with PA assessment and plan above.   Ellakate Gonsalves C. Donzetta Matters, MD [facility] Office: [phone] Pager: [phone]

## 2018-02-17 NOTE — Progress Notes (Signed)
D/c instructions given to pt. Prescriptions given. Wound care reviewed. All questions answered. Son to escort home.  Clyde Canterbury, RN

## 2018-02-17 NOTE — Plan of Care (Signed)
  Problem: Education: Goal: Knowledge of General Education information will improve Description Including pain rating scale, medication(s)/side effects and non-pharmacologic comfort measures Outcome: Progressing   Problem: Education: Goal: Knowledge of General Education information will improve Description Including pain rating scale, medication(s)/side effects and non-pharmacologic comfort measures Outcome: Progressing   Problem: Clinical Measurements: Goal: Ability to maintain clinical measurements within normal limits will improve Outcome: Progressing Goal: Diagnostic test results will improve Outcome: Progressing Goal: Respiratory complications will improve Outcome: Progressing Goal: Cardiovascular complication will be avoided Outcome: Progressing   Problem: Activity: Goal: Risk for activity intolerance will decrease Outcome: Progressing   Problem: Pain Managment: Goal: General experience of comfort will improve Outcome: Progressing   Problem: Safety: Goal: Ability to remain free from injury will improve Outcome: Progressing   Problem: Skin Integrity: Goal: Risk for impaired skin integrity will decrease Outcome: Progressing   Problem: Nutrition: Goal: Adequate nutrition will be maintained Outcome: Completed/Met   Problem: Coping: Goal: Level of anxiety will decrease Outcome: Completed/Met   Problem: Elimination: Goal: Will not experience complications related to urinary retention Outcome: Completed/Met

## 2018-02-17 NOTE — Discharge Summary (Signed)
Discharge Summary     Brian Huynh [birth year] 57 y.o. male  [id number]  Admission Date: 02/16/2018  Discharge Date: 02/17/18  Physician: Thomes Lolling*  Admission Diagnosis: LEFT CAROTID STENOSIS   HPI:   This is a 57 y.o. male without previous vascular history although he does have left lower extremity pain and his been told that he has blockages.  Presented with right upper extremity weakness and right lower extremity numbness.  Found to have bilateral strokes on MRI.  Has never had a stroke or coronary issues in the past.  He does not take any blood thinners or antiplatelet medications.  He has been started on aspirin since admission.  Hospital Course:  The patient was admitted to the hospital and taken to the operating room on 02/16/2018 and underwent left carotid endarterectomy.  The pt tolerated the procedure well and was transported to the PACU in good condition.  Findings: There was very tight stenosis with soft plaque at the carotid bifurcation extending up into the internal carotid artery.  The external and distal, was actually free of disease.  I completion there were expected signals with flow in diastole in the internal carotid artery.   By POD 1, the pt neuro status was in tact with a slight marginal mandibular neuropraxia.  He had ambulated and voided and was discharged home.   The remainder of the hospital course consisted of increasing mobilization and increasing intake of solids without difficulty.   Recent Labs    02/16/18 0946 02/17/18 0458  NA 139 138  K 4.2 4.6  CL 107 108  CO2 25 22  GLUCOSE 112* 163*  BUN 15 15  CALCIUM 9.0 7.8*   Recent Labs    02/16/18 0946 02/17/18 0458  WBC 8.6 16.4*  HGB 16.4 14.2  HCT 49.4 43.2  PLT 158 157   Recent Labs    02/16/18 0946  INR 1.07       Discharge Diagnosis:  LEFT CAROTID STENOSIS  Secondary Diagnosis: Patient Active Problem List   Diagnosis Date Noted  . Symptomatic  carotid artery stenosis, left 02/16/2018  . Tobacco abuse   . Essential hypertension   . IV drug abuse ([place])   . Thrombocytopenia ([place])   . CVA (cerebral vascular accident) ([place]) 01/26/2018  . Restless legs syndrome 05/13/2013  . Special screening for malignant neoplasms, colon 05/13/2013  . Hepatitis C, acute 03/31/2013  . ETOH abuse 03/29/2013  . Cholecystitis, acute 03/29/2013  . Transaminitis 03/28/2013   Past Medical History:  Diagnosis Date  . Alcohol abuse   . Anxiety   . Cholecystitis   . Chronic back pain    "mostly lower; some in my middle" (01/26/2018)  . CVA (cerebral vascular accident) ([place]) 01/25/2018   bilateral left and right subacute infarcts in the frontal and biparietal area/notes 01/26/2018;  . Daily headache   . Depression   . Hepatitis C   . Hypertension    "not high enough to take RX for it" (01/26/2018)  . Jaundice   . Peripheral vascular disease ()    carotid artery stenosis  . Pneumonia    as a child  . Sciatica   . Transaminitis     Allergies as of 02/17/2018      Reactions   Voltaren [diclofenac] Rash   Voltaren gel caused red bumps on the skin      Medication List    TAKE these medications   aspirin 325 MG tablet Take 1 tablet (325 mg  total) by mouth daily.   atorvastatin 40 MG tablet Commonly known as:  LIPITOR Take 1 tablet (40 mg total) by mouth daily at 6 PM.   citalopram 40 MG tablet Commonly known as:  CELEXA Take 40 mg by mouth daily.   clopidogrel 75 MG tablet Commonly known as:  PLAVIX Take 1 tablet (75 mg total) by mouth daily.   gabapentin 100 MG capsule Commonly known as:  NEURONTIN Take 2 capsules (200 mg total) by mouth at bedtime.   oxyCODONE-acetaminophen 5-325 MG tablet Commonly known as:  PERCOCET/ROXICET Take 1 tablet by mouth every 6 (six) hours as needed for severe pain.   traZODone 100 MG tablet Commonly known as:  DESYREL Take 100 mg by mouth at bedtime.        [facility] Discharge Instructions Carotid Endarterectomy (CEA)  Please refer to the following instructions for your post-procedure care. Your surgeon or physician assistant will discuss any changes with you.  Activity  You are encouraged to walk as much as you can. You can slowly return to normal activities but must avoid strenuous activity and heavy lifting until your doctor tell you it's OK. Avoid activities such as vacuuming or swinging a golf club. You can drive after one week if you are comfortable and you are no longer taking prescription pain medications. It is normal to feel tired for serval weeks after your surgery. It is also normal to have difficulty with sleep habits, eating, and bowel movements after surgery. These will go away with time.  Bathing/Showering  You may shower after you come home. Do not soak in a bathtub, hot tub, or swim until the incision heals completely.  Incision Care  Shower every day. Clean your incision with mild soap and water. Pat the area dry with a clean towel. You do not need a bandage unless otherwise instructed. Do not apply any ointments or creams to your incision. You may have skin glue on your incision. Do not peel it off. It will come off on its own in about one week. Your incision may feel thickened and raised for several weeks after your surgery. This is normal and the skin will soften over time. For Men Only: It's OK to shave around the incision but do not shave the incision itself for 2 weeks. It is common to have numbness under your chin that could last for several months.  Diet  Resume your normal diet. There are no special food restrictions following this procedure. A low fat/low cholesterol diet is recommended for all patients with vascular disease. In order to heal from your surgery, it is CRITICAL to get adequate nutrition. Your body requires vitamins, minerals, and protein. Vegetables are the best source of vitamins and minerals. Vegetables  also provide the perfect balance of protein. Processed food has little nutritional value, so try to avoid this.  Medications  Resume taking all of your medications unless your doctor or physician assistant tells you not to.  If your incision is causing pain, you may take over-the- counter pain relievers such as acetaminophen (Tylenol). If you were prescribed a stronger pain medication, please be aware these medications can cause nausea and constipation.  Prevent nausea by taking the medication with a snack or meal. Avoid constipation by drinking plenty of fluids and eating foods with a high amount of fiber, such as fruits, vegetables, and grains. Do not take Tylenol if you are taking prescription pain medications.  Follow Up  Our  office will schedule a follow up appointment 2-3 weeks following discharge.  Please call us immediately for any of the following conditions  . Increased pain, redness, drainage (pus) from your incision site. . Fever of 101 degrees or higher. . If you should develop stroke (slurred speech, difficulty swallowing, weakness on one side of your body, loss of vision) you should call 911 and go to the nearest emergency room. .  Reduce your risk of vascular disease:  . Stop smoking. If you would like help call [facility] at 1-800-QUIT-NOW 2516657189) or [place] at 339-326-8739. . Manage your cholesterol . Maintain a desired weight . Control your diabetes . Keep your blood pressure down .  If you have any questions, please call the office at [phone].  Prescriptions given: Roxicet #10 No Refill  Disposition: home  Patient's condition: is Good  Follow up: 1. Dr. Donzetta Matters in 2 weeks.   Leontine Locket, PA-C [facility] [phone]   --- For [facility] use ---   Modified Rankin score at D/C (0-6): 1  IV medication needed for:  1. Hypertension: No 2. Hypotension: No  Post-op Complications: No  1. Post-op CVA or TIA:  No  If yes: Event classification (right eye, left eye, right cortical, left cortical, verterobasilar, other): n/a  If yes: Timing of event (intra-op, <6 hrs post-op, >=6 hrs post-op, unknown): n/a  2. CN injury: No  If yes: CN n/a injuried   3. Myocardial infarction: No  If yes: Dx by (EKG or clinical, Troponin): n/a  4.  CHF: No  5.  Dysrhythmia (new): No  6. Wound infection: No  7. Reperfusion symptoms: No  8. Return to OR: No  If yes: return to OR for (bleeding, neurologic, other CEA incision, other): n/a  Discharge medications: Statin use:  Yes ASA use:  Yes   Beta blocker use:  No ACE-Inhibitor use:  No  ARB use:  No CCB use: No P2Y12 Antagonist use: Yes, [ x] Plavix, [ ]  Plasugrel, [ ]  Ticlopinine, [ ]  Ticagrelor, [ ]  Other, [ ]  No for medical reason, [ ]  Non-compliant, [ ]  Not-indicated Anti-coagulant use:  No, [ ]  Warfarin, [ ]  Rivaroxaban, [ ]  Dabigatran,

## 2018-03-19 ENCOUNTER — Ambulatory Visit: Payer: Non-veteran care | Admitting: Vascular Surgery

## 2018-03-31 NOTE — Progress Notes (Deleted)
[facility] [address] [place]. [place] [address] [phone]       OFFICE FOLLOW UP NOTE  [name] Date of Birth:  11-14-1960 Medical Record Number:  [id number]   Reason for Referral:  hospital stroke follow up  CHIEF COMPLAINT:  No chief complaint on file.   HPI: Brian Huynh is being seen today for initial visit in the office for bilateral MCA/ACA watershed, right MCA/PCA watershed and bilateral ACA patchy infarcts likely due to hypoperfusion from bilateral high-grade ICA stenosis on 01/26/2018. History obtained from *** and chart review. Reviewed all radiology images and labs personally.  [name] is a 57 y.o. male with history of alcohol abuse, substance use, HepC, and smoker who was admitted for right sided weakness.  Per notes, denied history of prior symptoms, neurological concerns or stroke/TIA.  No tPA given due to OSW. CT head reviewed and negative for acute infarct. MRI reviewed and showed bilateral MCA/ACA watershed, right MCA/PCA watershed and b/l ACA patchy infarct. CTA head/neck showed b/l ICA proximal high grade stenosis and right PCA stenosis. Carotid doppler b/l ICA 80-99% stenosis.  Due to current right-sided weakness, it was determined to undergo left CEA first within 2 weeks and to ensure BP goal 1 30-1 60 until this time given severe stenosis.  2D echo showed an EF of ***. LDL 108 and recommended starting lipitor 80mg  with recommendation of LFT monitoring by PCP.  Current tobacco user with smoking cessation counseling provided inpatient.  Current EtOH abuse and gradually decreasing alcohol intake was also highly recommended.  Patient is not on antithrombotic PTA and recommended DAPT with aspirin 325 mg and Plavix for 3 months and then single agent alone.  Patient was previously residing at [facility] and recommended for home health therapy as it was determined that shelter is able to accept agency and was  discharged in stable condition. Patient underwent left carotid enterectomy on 02/16/2018 which he tolerated well without complication.  It was recommended for him to follow-up 2 to 3 weeks postop but per epic, patient was a no-show for follow-up appointment.   ROS:   14 system review of systems performed and negative with exception of ***  PMH:  Past Medical History:  Diagnosis Date  . Alcohol abuse   . Anxiety   . Cholecystitis   . Chronic back pain    "mostly lower; some in my middle" (01/26/2018)  . CVA (cerebral vascular accident) ([place]) 01/25/2018   bilateral left and right subacute infarcts in the frontal and biparietal area/notes 01/26/2018;  . Daily headache   . Depression   . Hepatitis C   . Hypertension    "not high enough to take RX for it" (01/26/2018)  . Jaundice   . Peripheral vascular disease ([place])    carotid artery stenosis  . Pneumonia    as a child  . Sciatica   . Transaminitis     PSH:  Past Surgical History:  Procedure Laterality Date  . APPENDECTOMY    . BACK SURGERY    . CAROTID ENDARTERECTOMY Left 02/16/2018   PATCH ANGIOPLASTY  . ENDARTERECTOMY Left 02/16/2018   Procedure: Left Carotid ENDARTERECTOMY;  Surgeon: [name], MD;  Location: [facility];  Service: Vascular;  Laterality: Left;  . INGUINAL HERNIA REPAIR Right   . KNEE ARTHROSCOPY Left   . LUMBAR LAMINECTOMY  X 2  . ORCHIOPEXY Right   . PATCH ANGIOPLASTY  02/16/2018   Procedure: [facility]  ANGIOPLASTY;  Surgeon: [name], MD;  Location: [facility] OR;  Service: Vascular;;    Social History:  Social History   Socioeconomic History  . Marital status: Divorced    Spouse name: Not on file  . Number of children: 2  . Years of education: Not on file  . Highest education level: Not on file  Occupational History  . Not on file  Social Needs  . Financial resource strain: Not on file  . Food insecurity:    Worry: Not on file    Inability: Not on file  . Transportation needs:      Medical: Not on file    Non-medical: Not on file  Tobacco Use  . Smoking status: Current Some Day Smoker    Packs/day: 1.00    Years: 38.00    Pack years: 38.00    Types: Cigarettes  . Smokeless tobacco: Former Systems developer    Types: Snuff, Chew  . 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 Alcohol use: Yes    Alcohol/week: 8.0 standard drinks    Types: 8 Cans of beer per week    Comment: 01/26/2018 "2, 12oz beers, 3-4 days/wk"  . Drug use: Not Currently    Comment: 01/26/2018 "nothing for years"  . Sexual activity: Not Currently  Lifestyle  . Physical activity:    Days per week: Not on file    Minutes per session: Not on file  . Stress: Not on file  Relationships  . Social connections:    Talks on phone: Not on file    Gets together: Not on file    Attends religious service: Not on file    Active member of club or organization: Not on file    Attends meetings of clubs or organizations: Not on file    Relationship status: Not on file  . Intimate partner violence:    Fear of current or ex partner: Not on file    Emotionally abused: Not on file    Physically abused: Not on file    Forced sexual activity: Not on file  Other Topics Concern  . Not on file  Social History Narrative  . Not on file    Family History:  Family History  Problem Relation Age of Onset  . Stroke Brother        x 2 brothers  . AAA (abdominal aortic aneurysm) Father   . Diabetes Mother   . Lupus Mother     Medications:   Current Outpatient Medications on File Prior to Visit  Medication Sig Dispense Refill  . atorvastatin (LIPITOR) 40 MG tablet Take 1 tablet (40 mg total) by mouth daily at 6 PM. 30 tablet 0  . citalopram (CELEXA) 40 MG tablet Take 40 mg by mouth daily.    Marland Kitchen gabapentin (NEURONTIN) 100 MG capsule Take 2 capsules (200 mg total) by mouth at bedtime. 60 capsule 0  . oxyCODONE-acetaminophen (PERCOCET) 5-325 MG tablet Take 1 tablet by mouth  every 6 (six) hours as needed for severe pain. 10 tablet 0  . traZODone (DESYREL) 100 MG tablet Take 100 mg by mouth at bedtime.     No current facility-administered medications on file prior to visit.     Allergies:   Allergies  Allergen Reactions  . Voltaren [Diclofenac] Rash    Voltaren gel caused red bumps on the skin     Physical Exam  There were no vitals filed for this visit. There  is no height or weight on file to calculate BMI. No exam data present  General: well developed, well nourished, seated, in no evident distress Head: head normocephalic and atraumatic.   Neck: supple with no carotid or supraclavicular bruits Cardiovascular: regular rate and rhythm, no murmurs Musculoskeletal: no deformity Skin:  no rash/petichiae Vascular:  Normal pulses all extremities  Neurologic Exam Mental Status: Awake and fully alert. Oriented to place and time. Recent and remote memory intact. Attention span, concentration and fund of knowledge appropriate. Mood and affect appropriate.  Cranial Nerves: Fundoscopic exam reveals sharp disc margins. Pupils equal, briskly reactive to light. Extraocular movements full without nystagmus. Visual fields full to confrontation. Hearing intact. Facial sensation intact. Face, tongue, palate moves normally and symmetrically.  Motor: Normal bulk and tone. Normal strength in all tested extremity muscles. Sensory.: intact to touch , pinprick , position and vibratory sensation.  Coordination: Rapid alternating movements normal in all extremities. Finger-to-nose and heel-to-shin performed accurately bilaterally. Gait and Station: Arises from chair without difficulty. Stance is normal. Gait demonstrates normal stride length and balance . Able to heel, toe and tandem walk without difficulty.  Reflexes: 1+ and symmetric. Toes downgoing.    NIHSS  *** Modified Rankin  ***   Diagnostic Data (Labs, Imaging, Testing)  CT HEAD WO  CONTRAST 01/26/2018 IMPRESSION: 1. No acute intracranial findings. 2. White matter microvascular disease  MR BRAIN WO CONTRAST 01/26/2018 IMPRESSION: 1. Limited 2 sequence MRI head: Acute LEFT frontal infarcts. Subacute RIGHT frontal and biparietal infarcts. Constellation of findings concerning for vasculopathy or embolic phenomena, less likely PRES or venous infarcts. Given limited tolerance of MRI, consider repeat examination with sedation, MRA versus CTA head and neck. 2. Old RIGHT occipital lobe/PCA territory infarct. 3. Borderline parenchymal brain volume loss.  CT ANGIO HEAD W OR WO CONTRAST CT ANGIO NECK W OR WO CONTRAST 01/27/2018 IMPRESSION: 1. No emergent large vessel occlusion. 2. Bilateral proximal internal carotid artery stenosis, measuring 65% on the right and 75% on the left. 3. Multifocal severe stenosis of the right PCA with multiple short segment occlusions. 4.  Aortic Atherosclerosis (ICD10-I70.0).     ASSESSMENT: Brian Huynh is a 57 y.o. year old male here with bilateral MCA/ACA watershed, right MCA/PCA watershed and bilateral ACA territory infarcts likely due to hypoperfusion from bilateral high-grade ICA stenosis on 01/26/2018.  Patient underwent left carotid enterectomy on 02/15/2018.  Vascular risk factors include alcohol abuse, substance abuse, HTN, and HLD.     PLAN: -Continue {anticoagulants:31417}  and ***  for secondary stroke prevention -F/u with PCP regarding your *** management -continue to monitor BP at home -advised to continue to stay active and maintain a healthy diet -Maintain strict control of hypertension with blood pressure goal below 130/90, diabetes with hemoglobin A1c goal below 6.5% and cholesterol with LDL cholesterol (bad cholesterol) goal below 70 mg/dL. I also advised the patient to eat a healthy diet with plenty of whole grains, cereals, fruits and vegetables, exercise regularly and maintain ideal body weight.  Follow up in *** or  call earlier if needed   Greater than 50% of time during this 25 minute visit was spent on counseling,explanation of diagnosis of ***, reviewing risk factor management of ***, planning of further management, discussion with patient and family and coordination of care    Venancio Poisson, AGNP-BC  [facility] [address] [place], [place] [address]  Phone [phone] Fax [phone] Note: This document was prepared with digital dictation and possible smart phrase  technology. Any transcriptional errors that result from this process are unintentional.

## 2018-04-01 ENCOUNTER — Ambulatory Visit: Payer: Non-veteran care | Admitting: Adult Health

## 2018-04-01 ENCOUNTER — Encounter: Payer: Self-pay | Admitting: Adult Health

## 2018-04-05 ENCOUNTER — Telehealth: Payer: Self-pay

## 2018-04-05 NOTE — Telephone Encounter (Signed)
Patient no show for appt on 04/01/2018.

## 2018-04-09 ENCOUNTER — Other Ambulatory Visit: Payer: Self-pay

## 2018-04-09 ENCOUNTER — Other Ambulatory Visit: Payer: Self-pay | Admitting: *Deleted

## 2018-04-09 ENCOUNTER — Encounter: Payer: Self-pay | Admitting: *Deleted

## 2018-04-09 ENCOUNTER — Ambulatory Visit (INDEPENDENT_AMBULATORY_CARE_PROVIDER_SITE_OTHER): Payer: Non-veteran care | Admitting: Vascular Surgery

## 2018-04-09 ENCOUNTER — Encounter: Payer: Self-pay | Admitting: Vascular Surgery

## 2018-04-09 VITALS — BP 144/92 | HR 79 | Temp 97.4°F | Resp 18 | Ht 70.0 in | Wt 204.0 lb

## 2018-04-09 DIAGNOSIS — I63233 Cerebral infarction due to unspecified occlusion or stenosis of bilateral carotid arteries: Secondary | ICD-10-CM

## 2018-04-09 DIAGNOSIS — I6521 Occlusion and stenosis of right carotid artery: Secondary | ICD-10-CM

## 2018-04-09 MED ORDER — CLOPIDOGREL BISULFATE 75 MG PO TABS: 75.0000 mg | ORAL_TABLET | Freq: Every day | ORAL | 11 refills | Status: AC

## 2018-04-09 NOTE — Progress Notes (Signed)
Patient is taking Plavix since his last CEA

## 2018-04-09 NOTE — Progress Notes (Signed)
Patient ID: Brian Huynh, male   DOB: 12/13/1960, 57 y.o.   MRN: [id number]  Reason for Consult: Routine Post Op (2-3 wk f/u s/p L CEA)   Referred by [facility], [facility]  Subjective:     HPI:  Brian Huynh is a 57 y.o. male follows up from left carotid endarterectomy for symptomatic disease.  States that he is nearly back to normal from strength standpoint.  He has a known high-grade stenosis of the right as well.  He has never had any symptoms from that.  Continues on his   Past Medical History:  Diagnosis Date  . Alcohol abuse   . Anxiety   . Cholecystitis   . Chronic back pain    "mostly lower; some in my middle" (01/26/2018)  . CVA (cerebral vascular accident) ([place]) 01/25/2018   bilateral left and right subacute infarcts in the frontal and biparietal area/notes 01/26/2018;  . Daily headache   . Depression   . Hepatitis C   . Hypertension    "not high enough to take RX for it" (01/26/2018)  . Jaundice   . Peripheral vascular disease ([place])    carotid artery stenosis  . Pneumonia    as a child  . Sciatica   . Transaminitis    Family History  Problem Relation Age of Onset  . Stroke Brother        x 2 brothers  . AAA (abdominal aortic aneurysm) Father   . Diabetes Mother   . Lupus Mother    Past Surgical History:  Procedure Laterality Date  . APPENDECTOMY    . BACK SURGERY    . CAROTID ENDARTERECTOMY Left 02/16/2018   PATCH ANGIOPLASTY  . ENDARTERECTOMY Left 02/16/2018   Procedure: Left Carotid ENDARTERECTOMY;  Surgeon: [name], MD;  Location: [facility];  Service: Vascular;  Laterality: Left;  . INGUINAL HERNIA REPAIR Right   . KNEE ARTHROSCOPY Left   . LUMBAR LAMINECTOMY  X 2  . ORCHIOPEXY Right   . PATCH ANGIOPLASTY  02/16/2018   Procedure: PATCH ANGIOPLASTY;  Surgeon: [name], MD;  Location: [facility] OR;  Service: Vascular;;    Short Social History:  Social History   Tobacco Use  . Smoking status: Current Some Day Smoker    Packs/day: 1.00    Years: 38.00    Pack years: 38.00    Types: Cigarettes  . Smokeless tobacco: Former Systems developer    Types: Snuff, Chew  . 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 Alcohol use: Yes    Alcohol/week: 8.0 standard drinks    Types: 8 Cans of beer per week    Comment: 01/26/2018 "2, 12oz beers, 3-4 days/wk"    Allergies  Allergen Reactions  . Voltaren [Diclofenac] Rash    Voltaren gel caused red bumps on the skin    Current Outpatient Medications  Medication Sig Dispense Refill  . citalopram (CELEXA) 40 MG tablet Take 40 mg by mouth daily.    Marland Kitchen oxyCODONE-acetaminophen (PERCOCET) 5-325 MG tablet Take 1 tablet by mouth every 6 (six) hours as needed for severe pain. 10 tablet 0  . traZODone (DESYREL) 100 MG tablet Take 100 mg by mouth at bedtime.    Marland Kitchen atorvastatin (LIPITOR) 40 MG tablet Take 1 tablet (40 mg total) by mouth daily at 6 PM. 30 tablet 0  . clopidogrel (PLAVIX) 75 MG tablet Take 1 tablet (75 mg total) by mouth daily. 30 tablet 11  .  gabapentin (NEURONTIN) 100 MG capsule Take 2 capsules (200 mg total) by mouth at bedtime. 60 capsule 0   No current facility-administered medications for this visit.     Review of Systems  Constitutional:  Constitutional negative. Eyes: Eyes negative.  Cardiovascular: Positive for claudication.  GI: Gastrointestinal negative.  Musculoskeletal: Musculoskeletal negative.  Skin: Skin negative.  Neurological: Neurological negative. Hematologic: Hematologic/lymphatic negative.  Psychiatric: Psychiatric negative.        Objective:  Objective   Vitals:   04/09/18 1537 04/09/18 1538  BP: 131/84 (!) 144/92  Pulse: 79   Resp: 18   Temp: (!) 97.4 F (36.3 C)   TempSrc: Oral   SpO2: 96%   Weight: 204 lb (92.5 kg)   Height: 5\' 10"  (1.778 m)    Body mass index is 29.27 kg/m.  Physical Exam  Constitutional: He is oriented to person, place, and time. He appears well-developed.  HENT:    Head: Normocephalic.  Eyes: Pupils are equal, round, and reactive to light.  Neck: Neck supple.  Well-healed incision left neck  Cardiovascular: Normal rate.  Pulses:      Radial pulses are 2+ on the right side, and 2+ on the left side.  Pulmonary/Chest: Effort normal.  Musculoskeletal: Normal range of motion. He exhibits no edema.  Neurological: He is alert and oriented to person, place, and time. No cranial nerve deficit.  Skin: Skin is warm and dry.  Psychiatric: He has a normal mood and affect. His behavior is normal. Judgment and thought content normal.    Data: IMPRESSION: 1. No emergent large vessel occlusion. 2. Bilateral proximal internal carotid artery stenosis, measuring 65% on the right and 75% on the left. 3. Multifocal severe stenosis of the right PCA with multiple short segment occlusions. 4.  Aortic Atherosclerosis (ICD10-I70.0).      Assessment/Plan:     57 year old male had a left carotid endarterectomy for symptom medic disease.  He has an astigmatic high-grade stenosis on the right.  We will proceed with right carotid endarterectomy tentatively planned for October 31.  We have again discussed the risk and benefits of cranial nerve injury, myocardial infarction, stroke at 1 to 2% and that this is to prevent stroke.  He demonstrates good understanding.     [name] MD [facility]

## 2018-04-14 NOTE — Pre-Procedure Instructions (Signed)
Brian Huynh  04/14/2018      [facility] [place], [place] - [address] [place] [address] Phone: [phone] Fax: [phone]  [id number] - [place], [place] [address] [place] [address] Phone: [phone] Fax: [phone]    Your procedure is scheduled on Oct. 31  Report to [facility] at 5:30  A.M.  Call this number if you have problems the morning of surgery:  [phone]   Remember:   Do not eat or drink after midnight.      Take these medicines the morning of surgery with A SIP OF WATER :               Amlodipine (norvasc)              Citalopram (celexa)              7 days prior to surgery STOP taking  Aleve, Naproxen, Ibuprofen, Motrin, Advil, Goody's, BC's, all herbal medications, fish oil,  meloxicam (mobic)and all vitamins                              STOP PLAVIX PER DR. CAIN                  Do not wear jewelry.  Do not wear lotions, powders, or perfumes, or deodorant.  Do not shave 48 hours prior to surgery.  Men may shave face and neck.  Do not bring valuables to the hospital.  [facility] is not responsible for any belongings or valuables.  Contacts, dentures or bridgework may not be worn into surgery.  Leave your suitcase in the car.  After surgery it may be brought to your room.  For patients admitted to the hospital, discharge time will be determined by your treatment team.  Patients discharged the day of surgery will not be allowed to drive home.    Special instructions:   [facility]- Preparing For Surgery  Before surgery, you can play an important role. Because skin is not sterile, your skin needs to be as free of germs as possible. You can reduce the number of germs on your skin by washing with CHG (chlorahexidine gluconate) Soap before surgery.  CHG is an antiseptic cleaner  which kills germs and bonds with the skin to continue killing germs even after washing.    Oral Hygiene is also important to reduce your risk of infection.  Remember - BRUSH YOUR TEETH THE MORNING OF SURGERY WITH YOUR REGULAR TOOTHPASTE  Please do not use if you have an allergy to CHG or antibacterial soaps. If your skin becomes reddened/irritated stop using the CHG.  Do not shave (including legs and underarms) for at least 48 hours prior to first CHG shower. It is OK to shave your face.  Please follow these instructions carefully.   1. Shower the NIGHT BEFORE SURGERY and the MORNING OF SURGERY with CHG.   2. If you chose to wash your hair, wash your hair first as usual with your normal shampoo.  3. After you shampoo, rinse your hair and body thoroughly to remove the shampoo.  4. Use CHG as you would any other liquid soap. You can apply CHG directly to the skin and wash gently with a scrungie or a clean washcloth.   5. Apply the  CHG Soap to your body ONLY FROM THE NECK DOWN.  Do not use on open wounds or open sores. Avoid contact with your eyes, ears, mouth and genitals (private parts). Wash Face and genitals (private parts)  with your normal soap.  6. Wash thoroughly, paying special attention to the area where your surgery will be performed.  7. Thoroughly rinse your body with warm water from the neck down.  8. DO NOT shower/wash with your normal soap after using and rinsing off the CHG Soap.  9. Pat yourself dry with a CLEAN TOWEL.  10. Wear CLEAN PAJAMAS to bed the night before surgery, wear comfortable clothes the morning of surgery  11. Place CLEAN SHEETS on your bed the night of your first shower and DO NOT SLEEP WITH PETS.    Day of Surgery:  Do not apply any deodorants/lotions.  Please wear clean clothes to the hospital/surgery center.   Remember to brush your teeth WITH YOUR REGULAR TOOTHPASTE.    Please read over the following fact sheets that you were  given. Coughing and Deep Breathing, MRSA Information and Surgical Site Infection Prevention

## 2018-04-15 ENCOUNTER — Encounter (HOSPITAL_COMMUNITY)
Admission: RE | Admit: 2018-04-15 | Discharge: 2018-04-15 | Disposition: A | Discharge status: Home / Self Care | Payer: No Typology Code available for payment source | Source: Ambulatory Visit | Attending: Vascular Surgery | Admitting: Vascular Surgery

## 2018-04-15 ENCOUNTER — Encounter (HOSPITAL_COMMUNITY): Payer: Self-pay

## 2018-04-15 ENCOUNTER — Other Ambulatory Visit: Payer: Self-pay

## 2018-04-15 DIAGNOSIS — Z01812 Encounter for preprocedural laboratory examination: Secondary | ICD-10-CM | POA: Diagnosis not present

## 2018-04-15 LAB — COMPREHENSIVE METABOLIC PANEL
ALT: 59 U/L — AB (ref 0–44)
ANION GAP: 6 (ref 5–15)
AST: 49 U/L — ABNORMAL HIGH (ref 15–41)
Albumin: 3.5 g/dL (ref 3.5–5.0)
Alkaline Phosphatase: 80 U/L (ref 38–126)
BUN: 13 mg/dL (ref 6–20)
CHLORIDE: 108 mmol/L (ref 98–111)
CO2: 24 mmol/L (ref 22–32)
CREATININE: 0.94 mg/dL (ref 0.61–1.24)
Calcium: 8.9 mg/dL (ref 8.9–10.3)
GFR calc Af Amer: >60 mL/min (ref >60)
Glucose, Bld: 113 mg/dL — ABNORMAL HIGH (ref 70–99)
POTASSIUM: 4.1 mmol/L (ref 3.5–5.1)
SODIUM: 138 mmol/L (ref 135–145)
Total Bilirubin: 1 mg/dL (ref 0.3–1.2)
Total Protein: 7.8 g/dL (ref 6.5–8.1)

## 2018-04-15 LAB — URINALYSIS, ROUTINE W REFLEX MICROSCOPIC
Bilirubin Urine: NEGATIVE
Glucose, UA: NEGATIVE mg/dL
Hgb urine dipstick: NEGATIVE
KETONES UR: NEGATIVE mg/dL
LEUKOCYTES UA: NEGATIVE
Nitrite: NEGATIVE
Protein, ur: NEGATIVE mg/dL
Specific Gravity, Urine: 1.015 (ref 1.005–1.030)
pH: 5 (ref 5.0–8.0)

## 2018-04-15 LAB — CBC
HEMATOCRIT: 46.8 % (ref 39.0–52.0)
HEMOGLOBIN: 16 g/dL (ref 13.0–17.0)
MCH: 32.8 pg (ref 26.0–34.0)
MCHC: 34.2 g/dL (ref 30.0–36.0)
MCV: 95.9 fL (ref 80.0–100.0)
Platelets: 171 10*3/uL (ref 150–400)
RBC: 4.88 MIL/uL (ref 4.22–5.81)
RDW: 12.6 % (ref 11.5–15.5)
WBC: 13.6 10*3/uL — ABNORMAL HIGH (ref 4.0–10.5)
nRBC: 0 % (ref 0.0–0.2)

## 2018-04-15 LAB — TYPE AND SCREEN
ABO/RH(D): A POS
Antibody Screen: NEGATIVE

## 2018-04-15 LAB — SURGICAL PCR SCREEN
MRSA, PCR: NEGATIVE
Staphylococcus aureus: NEGATIVE

## 2018-04-15 LAB — PROTIME-INR
INR: 1.09
Prothrombin Time: 14 seconds (ref 11.4–15.2)

## 2018-04-15 LAB — APTT: APTT: 29 s (ref 24–36)

## 2018-04-15 NOTE — Progress Notes (Signed)
[facility] [place] - Denies  Chest x-ray - Denies  EKG - 01/26/18 (E)  Stress Test - Denies  ECHO - 8/19 (E)  Cardiac Cath - Denies  AICD- n/a PM- n/a LOOP- n/a  Sleep Study - Denies CPAP - None  LABS- 04/15/18: CBC, CMP, PT, PTT, T/S, UA, PCR  ASA- Continue Plavix- Continue  Anesthesia- No  Pt denies having chest pain, sob, or fever at this time. All instructions explained to the pt, with a verbal understanding of the material. Pt agrees to go over the instructions while at home for a better understanding. The opportunity to ask questions was provided.

## 2018-04-21 NOTE — Anesthesia Preprocedure Evaluation (Signed)
Anesthesia Evaluation  Patient identified by MRN, date of birth, ID band Patient awake    Reviewed: Allergy & Precautions, NPO status , Patient's Chart, lab work & pertinent test results  History of Anesthesia Complications Negative for: history of anesthetic complications  Airway Mallampati: I  TM Distance: >3 FB Neck ROM: Full    Dental  (+) Edentulous Upper, Edentulous Lower, Dental Advisory Given   Pulmonary pneumonia, Current Smoker,    Pulmonary exam normal        Cardiovascular hypertension, + Peripheral Vascular Disease  Normal cardiovascular exam Rhythm:Regular  Impressions:  - Normal LV size with mild LV hypertrophy. EF 55-60%. Normal RV   size and systolic function. No significant valvular   abnormalities.   Neuro/Psych  Headaches, PSYCHIATRIC DISORDERS Anxiety Depression  Neuromuscular disease CVA    GI/Hepatic negative GI ROS, (+) Hepatitis -, C  Endo/Other  negative endocrine ROS  Renal/GU negative Renal ROS     Musculoskeletal   Abdominal   Peds  Hematology   Anesthesia Other Findings   Reproductive/Obstetrics                             Lab Results  Component Value Date   WBC 13.6 (H) 04/15/2018   HGB 16.0 04/15/2018   HCT 46.8 04/15/2018   MCV 95.9 04/15/2018   PLT 171 04/15/2018   Lab Results  Component Value Date   CREATININE 0.94 04/15/2018   BUN 13 04/15/2018   NA 138 04/15/2018   K 4.1 04/15/2018   CL 108 04/15/2018   CO2 24 04/15/2018   Lab Results  Component Value Date   INR 1.09 04/15/2018   INR 1.07 02/16/2018   INR 1.11 01/26/2018   ECho:- Left ventricle: The cavity size was normal. Wall thickness was   increased in a pattern of mild LVH. Systolic function was normal.   The estimated ejection fraction was in the range of 55% to 60%.   Wall motion was normal; there were no regional wall motion   abnormalities. Doppler parameters are consistent  with abnormal   left ventricular relaxation (grade 1 diastolic dysfunction). - Aortic valve: There was no stenosis. - Mitral valve: There was no significant regurgitation. - Right ventricle: The cavity size was normal. Systolic function   was normal. - Pulmonary arteries: No complete TR doppler jet so unable to   estimate PA systolic pressure. - Systemic veins: IVC measured 2.1 cm with > 50% respirophasic   variation, suggesting RA pressure 8 mmHg  Anesthesia Physical  Anesthesia Plan  ASA: III  Anesthesia Plan: General   Post-op Pain Management:    Induction: Intravenous  PONV Risk Score and Plan: 3 and Dexamethasone, Ondansetron and Diphenhydramine  Airway Management Planned: Oral ETT  Additional Equipment: Arterial line  Intra-op Plan:   Post-operative Plan: Possible Post-op intubation/ventilation  Informed Consent: I have reviewed the patients History and Physical, chart, labs and discussed the procedure including the risks, benefits and alternatives for the proposed anesthesia with the patient or authorized representative who has indicated his/her understanding and acceptance.   Dental advisory given  Plan Discussed with: CRNA  Anesthesia Plan Comments:         Anesthesia Quick Evaluation

## 2018-04-22 ENCOUNTER — Inpatient Hospital Stay (HOSPITAL_COMMUNITY): Payer: No Typology Code available for payment source

## 2018-04-22 ENCOUNTER — Inpatient Hospital Stay (HOSPITAL_COMMUNITY): Payer: No Typology Code available for payment source | Admitting: Anesthesiology

## 2018-04-22 ENCOUNTER — Other Ambulatory Visit: Payer: Self-pay

## 2018-04-22 ENCOUNTER — Encounter (HOSPITAL_COMMUNITY): Payer: Self-pay | Admitting: *Deleted

## 2018-04-22 ENCOUNTER — Inpatient Hospital Stay (HOSPITAL_COMMUNITY)
Admission: RE | Admit: 2018-04-22 | Discharge: 2018-04-23 | DRG: 037 | Disposition: A | Discharge status: Home / Self Care | Payer: No Typology Code available for payment source | Attending: Vascular Surgery | Admitting: Vascular Surgery

## 2018-04-22 ENCOUNTER — Encounter (HOSPITAL_COMMUNITY)
Admission: RE | Disposition: A | Discharge status: Home / Self Care | Payer: Self-pay | Source: Home / Self Care | Attending: Vascular Surgery

## 2018-04-22 ENCOUNTER — Telehealth: Payer: Self-pay | Admitting: Vascular Surgery

## 2018-04-22 DIAGNOSIS — I952 Hypotension due to drugs: Secondary | ICD-10-CM | POA: Diagnosis not present

## 2018-04-22 DIAGNOSIS — Z8249 Family history of ischemic heart disease and other diseases of the circulatory system: Secondary | ICD-10-CM

## 2018-04-22 DIAGNOSIS — Z9889 Other specified postprocedural states: Secondary | ICD-10-CM

## 2018-04-22 DIAGNOSIS — Z833 Family history of diabetes mellitus: Secondary | ICD-10-CM | POA: Diagnosis not present

## 2018-04-22 DIAGNOSIS — Z7902 Long term (current) use of antithrombotics/antiplatelets: Secondary | ICD-10-CM

## 2018-04-22 DIAGNOSIS — Z79899 Other long term (current) drug therapy: Secondary | ICD-10-CM | POA: Diagnosis not present

## 2018-04-22 DIAGNOSIS — F17210 Nicotine dependence, cigarettes, uncomplicated: Secondary | ICD-10-CM | POA: Diagnosis present

## 2018-04-22 DIAGNOSIS — Z8619 Personal history of other infectious and parasitic diseases: Secondary | ICD-10-CM | POA: Diagnosis not present

## 2018-04-22 DIAGNOSIS — I700 Atherosclerosis of aorta: Secondary | ICD-10-CM | POA: Diagnosis present

## 2018-04-22 DIAGNOSIS — G8929 Other chronic pain: Secondary | ICD-10-CM | POA: Diagnosis present

## 2018-04-22 DIAGNOSIS — Z9289 Personal history of other medical treatment: Secondary | ICD-10-CM

## 2018-04-22 DIAGNOSIS — I6529 Occlusion and stenosis of unspecified carotid artery: Secondary | ICD-10-CM | POA: Diagnosis present

## 2018-04-22 DIAGNOSIS — J9601 Acute respiratory failure with hypoxia: Secondary | ICD-10-CM | POA: Diagnosis not present

## 2018-04-22 DIAGNOSIS — Z8673 Personal history of transient ischemic attack (TIA), and cerebral infarction without residual deficits: Secondary | ICD-10-CM | POA: Diagnosis not present

## 2018-04-22 DIAGNOSIS — Z23 Encounter for immunization: Secondary | ICD-10-CM

## 2018-04-22 DIAGNOSIS — I6521 Occlusion and stenosis of right carotid artery: Principal | ICD-10-CM | POA: Diagnosis present

## 2018-04-22 DIAGNOSIS — Z9049 Acquired absence of other specified parts of digestive tract: Secondary | ICD-10-CM

## 2018-04-22 DIAGNOSIS — Z8269 Family history of other diseases of the musculoskeletal system and connective tissue: Secondary | ICD-10-CM | POA: Diagnosis not present

## 2018-04-22 DIAGNOSIS — Y848 Other medical procedures as the cause of abnormal reaction of the patient, or of later complication, without mention of misadventure at the time of the procedure: Secondary | ICD-10-CM | POA: Diagnosis not present

## 2018-04-22 DIAGNOSIS — M545 Low back pain: Secondary | ICD-10-CM | POA: Diagnosis present

## 2018-04-22 DIAGNOSIS — Z9862 Peripheral vascular angioplasty status: Secondary | ICD-10-CM

## 2018-04-22 DIAGNOSIS — Y92234 Operating room of hospital as the place of occurrence of the external cause: Secondary | ICD-10-CM | POA: Diagnosis not present

## 2018-04-22 DIAGNOSIS — Z886 Allergy status to analgesic agent status: Secondary | ICD-10-CM | POA: Diagnosis not present

## 2018-04-22 DIAGNOSIS — T457X5A Adverse effect of anticoagulant antagonists, vitamin K and other coagulants, initial encounter: Secondary | ICD-10-CM | POA: Diagnosis not present

## 2018-04-22 DIAGNOSIS — Z823 Family history of stroke: Secondary | ICD-10-CM

## 2018-04-22 DIAGNOSIS — F329 Major depressive disorder, single episode, unspecified: Secondary | ICD-10-CM | POA: Diagnosis present

## 2018-04-22 DIAGNOSIS — Z9911 Dependence on respirator [ventilator] status: Secondary | ICD-10-CM

## 2018-04-22 DIAGNOSIS — T886XXA Anaphylactic reaction due to adverse effect of correct drug or medicament properly administered, initial encounter: Secondary | ICD-10-CM | POA: Diagnosis not present

## 2018-04-22 DIAGNOSIS — I739 Peripheral vascular disease, unspecified: Secondary | ICD-10-CM | POA: Diagnosis present

## 2018-04-22 DIAGNOSIS — I10 Essential (primary) hypertension: Secondary | ICD-10-CM | POA: Diagnosis present

## 2018-04-22 DIAGNOSIS — T782XXA Anaphylactic shock, unspecified, initial encounter: Secondary | ICD-10-CM

## 2018-04-22 HISTORY — PX: ENDARTERECTOMY: SHX5162

## 2018-04-22 LAB — CBC
HCT: 47.3 % (ref 39.0–52.0)
HEMOGLOBIN: 15.3 g/dL (ref 13.0–17.0)
MCH: 31.6 pg (ref 26.0–34.0)
MCHC: 32.3 g/dL (ref 30.0–36.0)
MCV: 97.7 fL (ref 80.0–100.0)
Platelets: 165 10*3/uL (ref 150–400)
RBC: 4.84 MIL/uL (ref 4.22–5.81)
RDW: 13 % (ref 11.5–15.5)
WBC: 13.2 10*3/uL — ABNORMAL HIGH (ref 4.0–10.5)
nRBC: 0 % (ref 0.0–0.2)

## 2018-04-22 LAB — BASIC METABOLIC PANEL
Anion gap: 9 (ref 5–15)
BUN: 16 mg/dL (ref 6–20)
CHLORIDE: 104 mmol/L (ref 98–111)
CO2: 22 mmol/L (ref 22–32)
CREATININE: 1.08 mg/dL (ref 0.61–1.24)
Calcium: 8.5 mg/dL — ABNORMAL LOW (ref 8.9–10.3)
GFR calc Af Amer: >60 mL/min (ref >60)
GFR calc non Af Amer: >60 mL/min (ref >60)
GLUCOSE: 197 mg/dL — AB (ref 70–99)
POTASSIUM: 4.5 mmol/L (ref 3.5–5.1)
SODIUM: 135 mmol/L (ref 135–145)

## 2018-04-22 LAB — POCT I-STAT 7, (LYTES, BLD GAS, ICA,H+H)
Acid-base deficit: 2 mmol/L (ref 0.0–2.0)
Bicarbonate: 29.9 mmol/L — ABNORMAL HIGH (ref 20.0–28.0)
Calcium, Ion: 1.23 mmol/L (ref 1.15–1.40)
HEMATOCRIT: 49 % (ref 39.0–52.0)
HEMOGLOBIN: 16.7 g/dL (ref 13.0–17.0)
O2 Saturation: 95 %
PCO2 ART: 86.4 mmHg — AB (ref 32.0–48.0)
Potassium: 3.9 mmol/L (ref 3.5–5.1)
SODIUM: 141 mmol/L (ref 135–145)
TCO2: 32 mmol/L (ref 22–32)
pH, Arterial: 7.147 — CL (ref 7.350–7.450)
pO2, Arterial: 98 mmHg (ref 83.0–108.0)

## 2018-04-22 LAB — POCT ACTIVATED CLOTTING TIME: Activated Clotting Time: 367 seconds

## 2018-04-22 SURGERY — ENDARTERECTOMY, CAROTID
Anesthesia: General | Site: Neck | Laterality: Right

## 2018-04-22 MED ORDER — LIDOCAINE 2% (20 MG/ML) 5 ML SYRINGE
INTRAMUSCULAR | Status: DC | PRN
  Administered 2018-04-22: 100 mg via INTRAVENOUS

## 2018-04-22 MED ORDER — CLOPIDOGREL BISULFATE 75 MG PO TABS
75.0000 mg | ORAL_TABLET | Freq: Every day | ORAL | Status: DC
  Administered 2018-04-23: 75 mg via ORAL
  Filled 2018-04-22: qty 1

## 2018-04-22 MED ORDER — SODIUM CHLORIDE 0.9 % IV SOLN: INTRAVENOUS | Status: DC

## 2018-04-22 MED ORDER — FENTANYL CITRATE (PF) 250 MCG/5ML IJ SOLN
INTRAMUSCULAR | Status: AC
  Filled 2018-04-22: qty 5

## 2018-04-22 MED ORDER — PROPOFOL 10 MG/ML IV BOLUS
INTRAVENOUS | Status: AC
  Filled 2018-04-22: qty 20

## 2018-04-22 MED ORDER — GABAPENTIN 100 MG PO CAPS
200.0000 mg | ORAL_CAPSULE | Freq: Every day | ORAL | Status: DC
  Administered 2018-04-22: 200 mg via ORAL
  Filled 2018-04-22: qty 2

## 2018-04-22 MED ORDER — ALBUTEROL SULFATE HFA 108 (90 BASE) MCG/ACT IN AERS
INHALATION_SPRAY | RESPIRATORY_TRACT | Status: DC | PRN
  Administered 2018-04-22: 4 via RESPIRATORY_TRACT
  Administered 2018-04-22: 8 via RESPIRATORY_TRACT
  Administered 2018-04-22: 4 via RESPIRATORY_TRACT

## 2018-04-22 MED ORDER — ESMOLOL HCL 100 MG/10ML IV SOLN
INTRAVENOUS | Status: DC | PRN
  Administered 2018-04-22: 30 mg via INTRAVENOUS

## 2018-04-22 MED ORDER — ONDANSETRON HCL 4 MG/2ML IJ SOLN: 4.0000 mg | Freq: Four times a day (QID) | INTRAMUSCULAR | Status: DC | PRN

## 2018-04-22 MED ORDER — ASPIRIN 325 MG PO TABS
325.0000 mg | ORAL_TABLET | Freq: Every day | ORAL | Status: DC
  Administered 2018-04-23: 325 mg via ORAL
  Filled 2018-04-22: qty 1

## 2018-04-22 MED ORDER — TRAZODONE HCL 50 MG PO TABS
50.0000 mg | ORAL_TABLET | Freq: Every day | ORAL | Status: DC
  Administered 2018-04-22: 50 mg via ORAL
  Filled 2018-04-22: qty 1

## 2018-04-22 MED ORDER — MAGNESIUM SULFATE 2 GM/50ML IV SOLN: 2.0000 g | Freq: Every day | INTRAVENOUS | Status: DC | PRN

## 2018-04-22 MED ORDER — AMLODIPINE BESYLATE 10 MG PO TABS
10.0000 mg | ORAL_TABLET | Freq: Every day | ORAL | Status: DC
  Administered 2018-04-23: 10 mg via ORAL
  Filled 2018-04-22: qty 1

## 2018-04-22 MED ORDER — PHENYLEPHRINE 40 MCG/ML (10ML) SYRINGE FOR IV PUSH (FOR BLOOD PRESSURE SUPPORT)
PREFILLED_SYRINGE | INTRAVENOUS | Status: DC | PRN
  Administered 2018-04-22 (×2): 80 ug via INTRAVENOUS
  Administered 2018-04-22: 120 ug via INTRAVENOUS
  Administered 2018-04-22 (×2): 80 ug via INTRAVENOUS
  Administered 2018-04-22: 120 ug via INTRAVENOUS
  Administered 2018-04-22: 80 ug via INTRAVENOUS

## 2018-04-22 MED ORDER — OXYCODONE-ACETAMINOPHEN 5-325 MG PO TABS
1.0000 | ORAL_TABLET | Freq: Four times a day (QID) | ORAL | Status: DC | PRN
  Administered 2018-04-23 (×3): 1 via ORAL
  Filled 2018-04-22 (×3): qty 1

## 2018-04-22 MED ORDER — ROCURONIUM BROMIDE 50 MG/5ML IV SOSY
PREFILLED_SYRINGE | INTRAVENOUS | Status: AC
  Filled 2018-04-22: qty 5

## 2018-04-22 MED ORDER — GLYCOPYRROLATE PF 0.2 MG/ML IJ SOSY
PREFILLED_SYRINGE | INTRAMUSCULAR | Status: DC | PRN
  Administered 2018-04-22: .2 mg via INTRAVENOUS

## 2018-04-22 MED ORDER — HEMOSTATIC AGENTS (NO CHARGE) OPTIME
TOPICAL | Status: DC | PRN
  Administered 2018-04-22: 1 via TOPICAL

## 2018-04-22 MED ORDER — LABETALOL HCL 5 MG/ML IV SOLN: 10.0000 mg | INTRAVENOUS | Status: DC | PRN

## 2018-04-22 MED ORDER — CEFAZOLIN SODIUM-DEXTROSE 2-4 GM/100ML-% IV SOLN
2.0000 g | INTRAVENOUS | Status: AC
  Administered 2018-04-22: 2 g via INTRAVENOUS

## 2018-04-22 MED ORDER — ACETAMINOPHEN 325 MG RE SUPP: 325.0000 mg | RECTAL | Status: DC | PRN

## 2018-04-22 MED ORDER — FENTANYL CITRATE (PF) 100 MCG/2ML IJ SOLN
25.0000 ug | INTRAMUSCULAR | Status: DC | PRN
  Administered 2018-04-22 (×2): 50 ug via INTRAVENOUS
  Filled 2018-04-22 (×2): qty 2

## 2018-04-22 MED ORDER — PHENOL 1.4 % MT LIQD: 1.0000 | OROMUCOSAL | Status: DC | PRN

## 2018-04-22 MED ORDER — 0.9 % SODIUM CHLORIDE (POUR BTL) OPTIME
TOPICAL | Status: DC | PRN
  Administered 2018-04-22: 1000 mL

## 2018-04-22 MED ORDER — CEFAZOLIN SODIUM-DEXTROSE 2-4 GM/100ML-% IV SOLN
2.0000 g | Freq: Three times a day (TID) | INTRAVENOUS | Status: AC
  Administered 2018-04-22 – 2018-04-23 (×2): 2 g via INTRAVENOUS
  Filled 2018-04-22 (×2): qty 100

## 2018-04-22 MED ORDER — PROPOFOL 10 MG/ML IV BOLUS
INTRAVENOUS | Status: DC | PRN
  Administered 2018-04-22: 150 mg via INTRAVENOUS
  Administered 2018-04-22: 50 mg via INTRAVENOUS

## 2018-04-22 MED ORDER — GLYCOPYRROLATE PF 0.2 MG/ML IJ SOSY
PREFILLED_SYRINGE | INTRAMUSCULAR | Status: AC
  Filled 2018-04-22: qty 1

## 2018-04-22 MED ORDER — FENTANYL CITRATE (PF) 100 MCG/2ML IJ SOLN: 25.0000 ug | INTRAMUSCULAR | Status: DC | PRN

## 2018-04-22 MED ORDER — HEPARIN SODIUM (PORCINE) 1000 UNIT/ML IJ SOLN
INTRAMUSCULAR | Status: DC | PRN
  Administered 2018-04-22: 10000 [IU] via INTRAVENOUS

## 2018-04-22 MED ORDER — ATORVASTATIN CALCIUM 40 MG PO TABS
40.0000 mg | ORAL_TABLET | Freq: Every day | ORAL | Status: DC
  Administered 2018-04-22: 40 mg via ORAL
  Filled 2018-04-22: qty 1

## 2018-04-22 MED ORDER — PNEUMOCOCCAL VAC POLYVALENT 25 MCG/0.5ML IJ INJ
0.5000 mL | INJECTION | INTRAMUSCULAR | Status: AC
  Administered 2018-04-23: 0.5 mL via INTRAMUSCULAR
  Filled 2018-04-22: qty 0.5

## 2018-04-22 MED ORDER — ONDANSETRON HCL 4 MG/2ML IJ SOLN
INTRAMUSCULAR | Status: DC | PRN
  Administered 2018-04-22: 4 mg via INTRAVENOUS

## 2018-04-22 MED ORDER — SODIUM CHLORIDE 0.9 % IV SOLN
INTRAVENOUS | Status: DC | PRN
  Administered 2018-04-22: 08:00:00

## 2018-04-22 MED ORDER — PROPOFOL 1000 MG/100ML IV EMUL
5.0000 ug/kg/min | INTRAVENOUS | Status: DC
  Filled 2018-04-22: qty 100

## 2018-04-22 MED ORDER — PHENYLEPHRINE 40 MCG/ML (10ML) SYRINGE FOR IV PUSH (FOR BLOOD PRESSURE SUPPORT)
PREFILLED_SYRINGE | INTRAVENOUS | Status: AC
  Filled 2018-04-22: qty 10

## 2018-04-22 MED ORDER — ROCURONIUM BROMIDE 10 MG/ML (PF) SYRINGE
PREFILLED_SYRINGE | INTRAVENOUS | Status: DC | PRN
  Administered 2018-04-22: 20 mg via INTRAVENOUS
  Administered 2018-04-22: 50 mg via INTRAVENOUS

## 2018-04-22 MED ORDER — LACTATED RINGERS IV SOLN
INTRAVENOUS | Status: DC | PRN
  Administered 2018-04-22 (×2): via INTRAVENOUS

## 2018-04-22 MED ORDER — LIDOCAINE 2% (20 MG/ML) 5 ML SYRINGE
INTRAMUSCULAR | Status: AC
  Filled 2018-04-22: qty 5

## 2018-04-22 MED ORDER — CEFAZOLIN SODIUM-DEXTROSE 2-4 GM/100ML-% IV SOLN
INTRAVENOUS | Status: AC
  Filled 2018-04-22: qty 100

## 2018-04-22 MED ORDER — DEXAMETHASONE SODIUM PHOSPHATE 10 MG/ML IJ SOLN
INTRAMUSCULAR | Status: DC | PRN
  Administered 2018-04-22: 10 mg via INTRAVENOUS

## 2018-04-22 MED ORDER — METOPROLOL TARTRATE 5 MG/5ML IV SOLN: 2.0000 mg | INTRAVENOUS | Status: DC | PRN

## 2018-04-22 MED ORDER — MIDAZOLAM HCL 2 MG/2ML IJ SOLN
INTRAMUSCULAR | Status: AC
  Filled 2018-04-22: qty 2

## 2018-04-22 MED ORDER — ENOXAPARIN SODIUM 40 MG/0.4ML ~~LOC~~ SOLN
40.0000 mg | SUBCUTANEOUS | Status: DC
  Administered 2018-04-23: 40 mg via SUBCUTANEOUS
  Filled 2018-04-22: qty 0.4

## 2018-04-22 MED ORDER — CHLORHEXIDINE GLUCONATE CLOTH 2 % EX PADS: 6.0000 | MEDICATED_PAD | Freq: Once | CUTANEOUS | Status: DC

## 2018-04-22 MED ORDER — HYDRALAZINE HCL 20 MG/ML IJ SOLN: 5.0000 mg | INTRAMUSCULAR | Status: DC | PRN

## 2018-04-22 MED ORDER — ONDANSETRON HCL 4 MG/2ML IJ SOLN
INTRAMUSCULAR | Status: AC
  Filled 2018-04-22: qty 2

## 2018-04-22 MED ORDER — MEPERIDINE HCL 50 MG/ML IJ SOLN: 6.2500 mg | INTRAMUSCULAR | Status: DC | PRN

## 2018-04-22 MED ORDER — MIDAZOLAM HCL 5 MG/5ML IJ SOLN
INTRAMUSCULAR | Status: DC | PRN
  Administered 2018-04-22: 2 mg via INTRAVENOUS

## 2018-04-22 MED ORDER — EPINEPHRINE PF 1 MG/10ML IJ SOSY
PREFILLED_SYRINGE | INTRAMUSCULAR | Status: DC | PRN
  Administered 2018-04-22 (×4): 10 ug via INTRAVENOUS

## 2018-04-22 MED ORDER — GUAIFENESIN-DM 100-10 MG/5ML PO SYRP: 15.0000 mL | ORAL_SOLUTION | ORAL | Status: DC | PRN

## 2018-04-22 MED ORDER — ACETAMINOPHEN 325 MG PO TABS: 325.0000 mg | ORAL_TABLET | ORAL | Status: DC | PRN

## 2018-04-22 MED ORDER — SODIUM CHLORIDE 0.9 % IV SOLN
INTRAVENOUS | Status: AC
  Filled 2018-04-22: qty 1.2

## 2018-04-22 MED ORDER — LACTATED RINGERS IV SOLN
INTRAVENOUS | Status: DC | PRN
  Administered 2018-04-22: 07:00:00 via INTRAVENOUS

## 2018-04-22 MED ORDER — DEXTRAN 40 IN D5W 10 % IV SOLN
INTRAVENOUS | Status: AC
  Filled 2018-04-22: qty 500

## 2018-04-22 MED ORDER — SODIUM CHLORIDE 0.9 % IV SOLN
INTRAVENOUS | Status: DC
  Administered 2018-04-22 – 2018-04-23 (×3): via INTRAVENOUS

## 2018-04-22 MED ORDER — PANTOPRAZOLE SODIUM 40 MG PO TBEC
40.0000 mg | DELAYED_RELEASE_TABLET | Freq: Every day | ORAL | Status: DC
  Administered 2018-04-22 – 2018-04-23 (×2): 40 mg via ORAL
  Filled 2018-04-22 (×2): qty 1

## 2018-04-22 MED ORDER — DIPHENHYDRAMINE HCL 50 MG/ML IJ SOLN
INTRAMUSCULAR | Status: DC | PRN
  Administered 2018-04-22: 25 mg via INTRAVENOUS

## 2018-04-22 MED ORDER — FENTANYL CITRATE (PF) 250 MCG/5ML IJ SOLN
INTRAMUSCULAR | Status: DC | PRN
  Administered 2018-04-22 (×2): 50 ug via INTRAVENOUS

## 2018-04-22 MED ORDER — LIDOCAINE HCL (PF) 1 % IJ SOLN
INTRAMUSCULAR | Status: AC
  Filled 2018-04-22: qty 30

## 2018-04-22 MED ORDER — ALUM & MAG HYDROXIDE-SIMETH 200-200-20 MG/5ML PO SUSP: 15.0000 mL | ORAL | Status: DC | PRN

## 2018-04-22 MED ORDER — CITALOPRAM HYDROBROMIDE 20 MG PO TABS
40.0000 mg | ORAL_TABLET | Freq: Every day | ORAL | Status: DC
  Administered 2018-04-22 – 2018-04-23 (×2): 40 mg via ORAL
  Filled 2018-04-22 (×3): qty 2

## 2018-04-22 MED ORDER — DOCUSATE SODIUM 100 MG PO CAPS
100.0000 mg | ORAL_CAPSULE | Freq: Every day | ORAL | Status: DC
  Administered 2018-04-23: 100 mg via ORAL
  Filled 2018-04-22: qty 1

## 2018-04-22 MED ORDER — PROTAMINE SULFATE 10 MG/ML IV SOLN
INTRAVENOUS | Status: DC | PRN
  Administered 2018-04-22: 50 mg via INTRAVENOUS

## 2018-04-22 MED ORDER — SODIUM CHLORIDE 0.9 % IV SOLN
INTRAVENOUS | Status: DC | PRN
  Administered 2018-04-22: 15 ug/min via INTRAVENOUS

## 2018-04-22 MED ORDER — BISACODYL 10 MG RE SUPP: 10.0000 mg | Freq: Every day | RECTAL | Status: DC | PRN

## 2018-04-22 MED ORDER — PROPOFOL 500 MG/50ML IV EMUL
INTRAVENOUS | Status: DC | PRN
  Administered 2018-04-22: 100 ug/kg/min via INTRAVENOUS

## 2018-04-22 MED ORDER — DEXAMETHASONE SODIUM PHOSPHATE 10 MG/ML IJ SOLN
INTRAMUSCULAR | Status: AC
  Filled 2018-04-22: qty 1

## 2018-04-22 MED ORDER — SODIUM CHLORIDE 0.9 % IV SOLN: 500.0000 mL | Freq: Once | INTRAVENOUS | Status: DC | PRN

## 2018-04-22 MED ORDER — MORPHINE SULFATE (PF) 2 MG/ML IV SOLN
1.0000 mg | INTRAVENOUS | Status: DC | PRN
  Administered 2018-04-22 (×3): 2 mg via INTRAVENOUS
  Filled 2018-04-22 (×3): qty 1

## 2018-04-22 MED ORDER — SODIUM CHLORIDE 0.9 % IV SOLN
0.0500 ug/kg/min | INTRAVENOUS | Status: AC
  Administered 2018-04-22: .2 ug/kg/min via INTRAVENOUS
  Filled 2018-04-22: qty 1000

## 2018-04-22 MED ORDER — FAMOTIDINE IN NACL 20-0.9 MG/50ML-% IV SOLN
20.0000 mg | Freq: Once | INTRAVENOUS | Status: AC
  Administered 2018-04-22: 20 mg via INTRAVENOUS

## 2018-04-22 MED ORDER — POTASSIUM CHLORIDE CRYS ER 20 MEQ PO TBCR: 20.0000 meq | EXTENDED_RELEASE_TABLET | Freq: Every day | ORAL | Status: DC | PRN

## 2018-04-22 MED ORDER — PROMETHAZINE HCL 25 MG/ML IJ SOLN: 6.2500 mg | INTRAMUSCULAR | Status: DC | PRN

## 2018-04-22 SURGICAL SUPPLY — 56 items
ADH SKN CLS APL DERMABOND .7 (GAUZE/BANDAGES/DRESSINGS) ×1
ADPR TBG 2 MALE LL ART (MISCELLANEOUS)
CANISTER SUCT 3000ML PPV (MISCELLANEOUS) ×3 IMPLANT
CATH ROBINSON RED A/P 18FR (CATHETERS) ×3 IMPLANT
CLIP VESOCCLUDE MED 24/CT (CLIP) ×3 IMPLANT
CLIP VESOCCLUDE SM WIDE 24/CT (CLIP) ×3 IMPLANT
COVER PROBE W GEL 5X96 (DRAPES) IMPLANT
COVER WAND RF STERILE (DRAPES) ×3 IMPLANT
CRADLE DONUT ADULT HEAD (MISCELLANEOUS) ×3 IMPLANT
DERMABOND ADVANCED (GAUZE/BANDAGES/DRESSINGS) ×2
DERMABOND ADVANCED .7 DNX12 (GAUZE/BANDAGES/DRESSINGS) ×1 IMPLANT
DRAIN CHANNEL 15F RND FF W/TCR (WOUND CARE) IMPLANT
ELECT REM PT RETURN 9FT ADLT (ELECTROSURGICAL) ×3
ELECTRODE REM PT RTRN 9FT ADLT (ELECTROSURGICAL) ×1 IMPLANT
EVACUATOR SILICONE 100CC (DRAIN) IMPLANT
GLOVE BIO SURGEON STRL SZ7.5 (GLOVE) ×3 IMPLANT
GLOVE BIOGEL PI IND STRL 7.5 (GLOVE) IMPLANT
GLOVE BIOGEL PI INDICATOR 7.5 (GLOVE) ×8
GLOVE ECLIPSE 7.0 STRL STRAW (GLOVE) ×2 IMPLANT
GLOVE SS BIOGEL STRL SZ 7 (GLOVE) IMPLANT
GLOVE SUPERSENSE BIOGEL SZ 7 (GLOVE) ×8
GOWN STRL REUS W/ TWL LRG LVL3 (GOWN DISPOSABLE) ×2 IMPLANT
GOWN STRL REUS W/ TWL XL LVL3 (GOWN DISPOSABLE) ×1 IMPLANT
GOWN STRL REUS W/TWL LRG LVL3 (GOWN DISPOSABLE) ×6
GOWN STRL REUS W/TWL XL LVL3 (GOWN DISPOSABLE) ×3
HEMOSTAT SNOW SURGICEL 2X4 (HEMOSTASIS) IMPLANT
INSERT FOGARTY SM (MISCELLANEOUS) ×3 IMPLANT
IV ADAPTER SYR DOUBLE MALE LL (MISCELLANEOUS) IMPLANT
KIT BASIN OR (CUSTOM PROCEDURE TRAY) ×3 IMPLANT
KIT SHUNT ARGYLE CAROTID ART 6 (VASCULAR PRODUCTS) ×2 IMPLANT
KIT TURNOVER KIT B (KITS) ×3 IMPLANT
NDL HYPO 25GX1X1/2 BEV (NEEDLE) IMPLANT
NDL SPNL 20GX3.5 QUINCKE YW (NEEDLE) IMPLANT
NEEDLE HYPO 25GX1X1/2 BEV (NEEDLE) IMPLANT
NEEDLE SPNL 20GX3.5 QUINCKE YW (NEEDLE) IMPLANT
NS IRRIG 1000ML POUR BTL (IV SOLUTION) ×9 IMPLANT
PACK CAROTID (CUSTOM PROCEDURE TRAY) ×3 IMPLANT
PAD ARMBOARD 7.5X6 YLW CONV (MISCELLANEOUS) ×6 IMPLANT
PATCH VASC XENOSURE 1CMX6CM (Vascular Products) ×3 IMPLANT
PATCH VASC XENOSURE 1X6 (Vascular Products) IMPLANT
SHUNT CAROTID BYPASS 10 (VASCULAR PRODUCTS) IMPLANT
SHUNT CAROTID BYPASS 12FRX15.5 (VASCULAR PRODUCTS) IMPLANT
STOPCOCK 4 WAY LG BORE MALE ST (IV SETS) IMPLANT
SUT ETHILON 3 0 PS 1 (SUTURE) IMPLANT
SUT MNCRL AB 4-0 PS2 18 (SUTURE) ×3 IMPLANT
SUT PROLENE 6 0 BV (SUTURE) ×3 IMPLANT
SUT SILK 3 0 (SUTURE) ×3
SUT SILK 3-0 18XBRD TIE 12 (SUTURE) IMPLANT
SUT VIC AB 2-0 CT1 27 (SUTURE) ×3
SUT VIC AB 2-0 CT1 TAPERPNT 27 (SUTURE) ×1 IMPLANT
SUT VIC AB 3-0 SH 27 (SUTURE) ×3
SUT VIC AB 3-0 SH 27X BRD (SUTURE) ×1 IMPLANT
SYR CONTROL 10ML LL (SYRINGE) IMPLANT
TOWEL GREEN STERILE (TOWEL DISPOSABLE) ×3 IMPLANT
TUBING ART PRESS 48 MALE/FEM (TUBING) IMPLANT
WATER STERILE IRR 1000ML POUR (IV SOLUTION) ×3 IMPLANT

## 2018-04-22 NOTE — Telephone Encounter (Signed)
sch appt lvm mld ltr 05/14/18 245pm p/o MD

## 2018-04-22 NOTE — Telephone Encounter (Signed)
-----   Message from Mena Goes, RN sent at 04/22/2018 11:03 AM EDT ----- Regarding: 2-3 weeks post CEA on 04-22-18   ----- Message ----- From: Ulyses Amor, PA-C Sent: 04/22/2018  10:29 AM EDT To: [facility] Admin Pool, [facility] Charge Pool   S/P right CEA by Dr. Donzetta Matters f/u 2-3 weeks

## 2018-04-22 NOTE — H&P (Signed)
   History and Physical Update  The patient was interviewed and re-examined.  The patient's previous History and Physical has been reviewed and is unchanged from recent office visit. Plan for right CEA.  Brian Falletta C. Donzetta Matters, MD [facility] Office: [phone] Pager: [phone]   04/22/2018, 7:16 AM

## 2018-04-22 NOTE — Anesthesia Procedure Notes (Signed)
Arterial Line Insertion Start/End10/31/2019 7:20 AM, 04/22/2018 7:25 AM Performed by: Nolon Nations, MD, Imagene Riches, CRNA, CRNA  Patient location: Pre-op. Preanesthetic checklist: patient identified, IV checked, site marked, risks and benefits discussed, surgical consent, monitors and equipment checked, pre-op evaluation, timeout performed and anesthesia consent Lidocaine 1% used for infiltration and patient sedated Right, radial was placed Catheter size: 20 G Hand hygiene performed , maximum sterile barriers used  and Seldinger technique used Allen's test indicative of satisfactory collateral circulation Attempts: 3 Procedure performed without using ultrasound guided technique. Following insertion, dressing applied and Biopatch. Post procedure assessment: normal  Post procedure complications: local hematoma. Patient tolerated the procedure well with no immediate complications.

## 2018-04-22 NOTE — Anesthesia Procedure Notes (Signed)
Procedure Name: Intubation Date/Time: 04/22/2018 7:46 AM Performed by: Imagene Riches, CRNA Pre-anesthesia Checklist: Patient identified, Emergency Drugs available, Suction available and Patient being monitored Patient Re-evaluated:Patient Re-evaluated prior to induction Oxygen Delivery Method: Circle System Utilized Preoxygenation: Pre-oxygenation with 100% oxygen Induction Type: IV induction Ventilation: Mask ventilation without difficulty Laryngoscope Size: 4 and Mac Grade View: Grade I Tube type: Oral Tube size: 7.5 mm Number of attempts: 1 Airway Equipment and Method: Stylet and Oral airway Placement Confirmation: ETT inserted through vocal cords under direct vision,  positive ETCO2 and breath sounds checked- equal and bilateral Tube secured with: Tape Dental Injury: Teeth and Oropharynx as per pre-operative assessment

## 2018-04-22 NOTE — Anesthesia Postprocedure Evaluation (Signed)
Anesthesia Post Note  Patient: [name]  Procedure(s) Performed: ENDARTERECTOMY CAROTID (Right Neck)     Patient location during evaluation: SICU Anesthesia Type: General Level of consciousness: sedated and patient cooperative Pain management: pain level controlled Vital Signs Assessment: post-procedure vital signs reviewed and stable Respiratory status: spontaneous breathing Cardiovascular status: stable Anesthetic complications: no Comments: Pt taken to ICU intubated due to anaphylactic reaction to protamine and poor ABG results in OR. Extubated.    Last Vitals:  Vitals:   04/22/18 1108 04/22/18 1130  BP: (!) 142/77 128/90  Pulse: 97 86  Resp: 17 15  Temp:    SpO2: 100% 100%    Last Pain:  Vitals:   04/22/18 1131  TempSrc:   PainSc: 0-No pain                 Nolon Nations

## 2018-04-22 NOTE — Procedures (Signed)
Extubation Procedure Note  Patient Details:   Name: Brian Huynh DOB: 28-Nov-1960 MRN: [id number]   Airway Documentation:    Vent end date: 04/22/18 Vent end time: 1145   Evaluation  O2 sats: stable throughout Complications: No apparent complications Patient did tolerate procedure well. Bilateral Breath Sounds: Clear, Diminished   Yes  2ll/min [place] Incentive spirometer performed 1.1L No stridor  Revonda Standard 04/22/2018, 11:58 AM

## 2018-04-22 NOTE — Discharge Instructions (Signed)
   [facility]  Discharge Instructions   Carotid Endarterectomy (CEA)  Please refer to the following instructions for your post-procedure care. Your surgeon or physician assistant will discuss any changes with you.  Activity  You are encouraged to walk as much as you can. You can slowly return to normal activities but must avoid strenuous activity and heavy lifting until your doctor tell you it's OK. Avoid activities such as vacuuming or swinging a golf club. You can drive after one week if you are comfortable and you are no longer taking prescription pain medications. It is normal to feel tired for serval weeks after your surgery. It is also normal to have difficulty with sleep habits, eating, and bowel movements after surgery. These will go away with time.  Bathing/Showering  You may shower after you come home. Do not soak in a bathtub, hot tub, or swim until the incision heals completely.  Incision Care  Shower every day. Clean your incision with mild soap and water. Pat the area dry with a clean towel. You do not need a bandage unless otherwise instructed. Do not apply any ointments or creams to your incision. You may have skin glue on your incision. Do not peel it off. It will come off on its own in about one week. Your incision may feel thickened and raised for several weeks after your surgery. This is normal and the skin will soften over time. For Men Only: It's OK to shave around the incision but do not shave the incision itself for 2 weeks. It is common to have numbness under your chin that could last for several months.  Diet  Resume your normal diet. There are no special food restrictions following this procedure. A low fat/low cholesterol diet is recommended for all patients with vascular disease. In order to heal from your surgery, it is CRITICAL to get adequate nutrition. Your body requires vitamins, minerals, and protein. Vegetables are the best  source of vitamins and minerals. Vegetables also provide the perfect balance of protein. Processed food has little nutritional value, so try to avoid this.        Medications  Resume taking all of your medications unless your doctor or physician assistant tells you not to. If your incision is causing pain, you may take over-the- counter pain relievers such as acetaminophen (Tylenol). If you were prescribed a stronger pain medication, please be aware these medications can cause nausea and constipation. Prevent nausea by taking the medication with a snack or meal. Avoid constipation by drinking plenty of fluids and eating foods with a high amount of fiber, such as fruits, vegetables, and grains. Do not take Tylenol if you are taking prescription pain medications.  Follow Up  Our office will schedule a follow up appointment 2-3 weeks following discharge.  Please call us immediately for any of the following conditions  Increased pain, redness, drainage (pus) from your incision site. Fever of 101 degrees or higher. If you should develop stroke (slurred speech, difficulty swallowing, weakness on one side of your body, loss of vision) you should call 911 and go to the nearest emergency room.  Reduce your risk of vascular disease:  Stop smoking. If you would like help call [facility] at 1-800-QUIT-NOW (1-800-784-8669) or [place] at [phone]. Manage your cholesterol Maintain a desired weight Control your diabetes Keep your blood pressure down  If you have any questions, please call the office at [phone].   

## 2018-04-22 NOTE — Consult Note (Signed)
NAME:  Brian Huynh, MRN:  [id number], DOB:  [birth year], LOS: 0 ADMISSION DATE:  04/22/2018, CONSULTATION DATE:  04/22/2018  REFERRING MD: Dr. Donzetta Matters, CHIEF COMPLAINT: Intubated, mechanical ventilation, shock  Brief History   Outpatient surgery for right carotid endarterectomy, given protamine for heparin reversal and developed anaphylactic shock.  Past Medical History   Past Medical History:  Diagnosis Date  . Alcohol abuse   . Anxiety   . Cholecystitis   . Chronic back pain    "mostly lower; some in my middle" (01/26/2018)  . CVA (cerebral vascular accident) ([place]) 01/25/2018   bilateral left and right subacute infarcts in the frontal and biparietal area/notes 01/26/2018;  . Daily headache   . Depression   . Hepatitis C   . Hypertension    "not high enough to take RX for it" (01/26/2018)  . Jaundice   . Peripheral vascular disease ([place])    carotid artery stenosis  . Pneumonia    as a child  . Sciatica   . Transaminitis     Significant Hospital Events   10/31 -carotid endarterectomy, right, repeated to the ICU 10/31 -liberation from mechanical ventilation  Consults: date of consult/date signed off & final recs:  CCM-04/22/2018  Procedures (surgical and bedside):  04/22/2018: Symptomatic carotid endarterectomy with bovine pericardial patch angioplasty.  Significant Diagnostic Tests:   Micro Data:   Antimicrobials:   Subjective:  Following expiration patient states he is doing well.  He does have a little bit of a scratchy throat.  He does complain of neck soreness.  Objective   Blood pressure 128/90, pulse 86, temperature 97.7 F (36.5 C), temperature source Oral, resp. rate 15, SpO2 100 %.    Vent Mode: CPAP;PSV FiO2 (%):  [40 %] 40 % Set Rate:  [18 bmp] 18 bmp Vt Set:  [590 mL] 590 mL PEEP:  [5 cmH20] 5 cmH20 Pressure Support:  [5 cmH20] 5 cmH20 Plateau Pressure:  [13 cmH20] 13 cmH20   Intake/Output Summary (Last 24 hours) at 04/22/2018 1215 Last data  filed at 04/22/2018 1100 Gross per 24 hour  Intake 1200 ml  Output 100 ml  Net 1100 ml   There were no vitals filed for this visit.  Post extubation vitals, heart rate mid 80s, blood pressure 140/30, respiratory rate 16  Examination: General: Middle-aged male, resting in bed, no acute distress post extubation HENT: NCAT, sclera clear, tracking appropriately Neck: Right incision, clean dry and intact Lungs: Clear to auscultation bilaterally, no crackles, no wheeze Cardiovascular: Regular rate and rhythm, S1-S2, no murmurs or gallops Abdomen: Soft, nontender, nondistended Extremities: Lower extremity edema Neuro: Alert, oriented, following commands no focal deficit, moves all 4 extremities with command Psych: Normal mood affect, no sign of delirium  Resolved Hospital Problem list     Assessment & Plan:   Acute hypoxemic respiratory failure requiring intubation mechanical ventilation following right carotid endarterectomy by vascular surgery.  Postop during closing patient was given protamine for reversal of heparin and developed significant hypotension, concern for anaphylactic shock - Patient has already been given steroids as well as antihistamines - He was also given doses of epinephrine. -Patient remains in the intensive care unit for close hemodynamic monitoring and respiratory status. -Shock likely related to anaphylaxis from protamine, now resolved -Patient was awake alert in the intensive care unit following all commands, passing SBT/SAT -Decision was made for extubation and liberation from the ventilator - Patient likely stable this afternoon for transfer to pcu per vascular surgery service  Carotid  artery disease History of hypertension History of peripheral vascular disease  Disposition / Summary of Today's Plan 04/22/18   Stable after liberation from ventilator    Diet: Per surgery service Pain/Anxiety/Delirium protocol (if indicated): Not needed following  extubation VAP protocol (if indicated): Not applicable DVT prophylaxis: Can start per surgery service GI prophylaxis: Not applicable Hyperglycemia protocol: Not applicable Mobility: Bedrest Code Status: Full Family Communication: Communicating directly with patient post extubation  Labs   CBC: Recent Labs  Lab 04/15/18 1305  WBC 13.6*  HGB 16.0  HCT 46.8  MCV 95.9  PLT 970    Basic Metabolic Panel: Recent Labs  Lab 04/15/18 1305  NA 138  K 4.1  CL 108  CO2 24  GLUCOSE 113*  BUN 13  CREATININE 0.94  CALCIUM 8.9   GFR: Estimated Creatinine Clearance: 99.1 mL/min (by C-G formula based on SCr of 0.94 mg/dL). Recent Labs  Lab 04/15/18 1305  WBC 13.6*    Liver Function Tests: Recent Labs  Lab 04/15/18 1305  AST 49*  ALT 59*  ALKPHOS 80  BILITOT 1.0  PROT 7.8  ALBUMIN 3.5   No results for input(s): LIPASE, AMYLASE in the last 168 hours. No results for input(s): AMMONIA in the last 168 hours.  ABG    Component Value Date/Time   TCO2 26 01/26/2018 1109     Coagulation Profile: Recent Labs  Lab 04/15/18 1305  INR 1.09    Cardiac Enzymes: No results for input(s): CKTOTAL, CKMB, CKMBINDEX, TROPONINI in the last 168 hours.  HbA1C: Hgb A1c MFr Bld  Date/Time Value Ref Range Status  01/26/2018 11:02 AM 4.6 (L) 4.8 - 5.6 % Final    Comment:    (NOTE) Pre diabetes:          5.7%-6.4% Diabetes:              >6.4% Glycemic control for   <7.0% adults with diabetes     CBG: No results for input(s): GLUCAP in the last 168 hours.  Admitting History of Present Illness.    This is a 57 year old gentleman that presented to the hospital for an outpatient right carotid endarterectomy for symptom medic disease.  He was found to have also known high-grade stenosis of the right.  He was taken to the operating room in normal fashion.  Postoperatively during closing the patient became hypotensive assume secondary to protamine reversal of heparin.  Patient was  given steroids, Benadryl as well as epi in the OR.  Patient was left intubated and transferred to the cardiovascular ICU.  Pulmonary care was consulted for recommendations made regarding vent and evaluation for liberation from the ventilator.  Review of Systems:   Unable to be obtained, intubated on mechanical ventilation.  Past Medical History  He,  has a past medical history of Alcohol abuse, Anxiety, Cholecystitis, Chronic back pain, CVA (cerebral vascular accident) ([place]) (01/25/2018), Daily headache, Depression, Hepatitis C, Hypertension, Jaundice, Peripheral vascular disease ([place]), Pneumonia, Sciatica, and Transaminitis.   Surgical History    Past Surgical History:  Procedure Laterality Date  . APPENDECTOMY    . BACK SURGERY    . CAROTID ENDARTERECTOMY Left 02/16/2018   PATCH ANGIOPLASTY  . ENDARTERECTOMY Left 02/16/2018   Procedure: Left Carotid ENDARTERECTOMY;  Surgeon: [name], MD;  Location: [facility];  Service: Vascular;  Laterality: Left;  . INGUINAL HERNIA REPAIR Right   . KNEE ARTHROSCOPY Left   . LUMBAR LAMINECTOMY  X 2  . ORCHIOPEXY Right   . PATCH  ANGIOPLASTY  02/16/2018   Procedure: PATCH ANGIOPLASTY;  Surgeon: [name], MD;  Location: [facility];  Service: Vascular;;     Social History   Social History   Socioeconomic History  . Marital status: Divorced    Spouse name: Not on file  . Number of children: 2  . Years of education: Not on file  . Highest education level: Not on file  Occupational History  . Not on file  Social Needs  . Financial resource strain: Not on file  . Food insecurity:    Worry: Not on file    Inability: Not on file  . Transportation needs:    Medical: Not on file    Non-medical: Not on file  Tobacco Use  . Smoking status: Current Some Day Smoker    Packs/day: 0.25    Years: 38.00    Pack years: 9.50    Types: Cigarettes  . Smokeless tobacco: Former Systems developer    Types: Snuff, Chew  . Tobacco comment:  01/26/2018 "stopped chew/snuff in the army"  Substance and Sexual Activity  . Alcohol use: Yes    Alcohol/week: 8.0 standard drinks    Types: 8 Cans of beer per week    Comment: 01/26/2018 "2, 12oz beers, 3-4 days/wk"  . Drug use: Not Currently    Comment: 01/26/2018 "nothing for years"  . Sexual activity: Not Currently  Lifestyle  . Physical activity:    Days per week: Not on file    Minutes per session: Not on file  . Stress: Not on file  Relationships  . Social connections:    Talks on phone: Not on file    Gets together: Not on file    Attends religious service: Not on file    Active member of club or organization: Not on file    Attends meetings of clubs or organizations: Not on file    Relationship status: Not on file  . Intimate partner violence:    Fear of current or ex partner: Not on file    Emotionally abused: Not on file    Physically abused: Not on file    Forced sexual activity: Not on file  Other Topics Concern  . Not on file  Social History Narrative  . Not on file  ,  reports that he has been smoking cigarettes. He has a 9.50 pack-year smoking history. He has quit using smokeless tobacco.  His smokeless tobacco use included snuff and chew. He reports that he drinks about 8.0 standard drinks of alcohol per week. He reports that he has current or past drug history.   Family History   His family history includes AAA (abdominal aortic aneurysm) in his father; Diabetes in his mother; Lupus in his mother; Stroke in his brother.   Allergies Allergies  Allergen Reactions  . Protamine Anaphylaxis    Intraoperative anaphylactic type reaction following protamine administration  . Voltaren [Diclofenac] Rash    Voltaren gel caused red bumps on the skin     Home Medications  Prior to Admission medications   Medication Sig Start Date End Date Taking? Authorizing Provider  amLODipine (NORVASC) 10 MG tablet Take 10 mg by mouth daily.   Yes [provider]  aspirin  325 MG tablet Take 325 mg by mouth daily.   Yes [provider]  atorvastatin (LIPITOR) 40 MG tablet Take 1 tablet (40 mg total) by mouth daily at 6 PM. 02/03/18 04/13/18 Yes Donne Hazel, MD  citalopram (CELEXA) 40 MG  tablet Take 40 mg by mouth daily.   Yes [provider]  clopidogrel (PLAVIX) 75 MG tablet Take 1 tablet (75 mg total) by mouth daily. 04/09/18  Yes [name], MD  gabapentin (NEURONTIN) 100 MG capsule Take 2 capsules (200 mg total) by mouth at bedtime. 02/03/18 04/13/18 Yes Donne Hazel, MD  traZODone (DESYREL) 50 MG tablet Take 50 mg by mouth at bedtime.    Yes [provider]  meloxicam (MOBIC) 15 MG tablet Take 15 mg by mouth daily as needed for pain.    [provider]  oxyCODONE-acetaminophen (PERCOCET) 5-325 MG tablet Take 1 tablet by mouth every 6 (six) hours as needed for severe pain. Patient not taking: Reported on 04/13/2018 02/17/18   Gabriel Earing, PA-C     This patient is critically ill with multiple organ system failure; which, requires frequent high complexity decision making, assessment, support, evaluation, and titration of therapies. This was completed through the application of advanced monitoring technologies and extensive interpretation of multiple databases. During this encounter critical care time was devoted to patient care services described in this note for 32 minutes.   Garner Nash, DO [facility] 04/22/2018 12:15 PM  Personal pager: [phone] If unanswered, please page CCM On-call: [phone]

## 2018-04-22 NOTE — Transfer of Care (Signed)
Immediate Anesthesia Transfer of Care Note  Patient: Brian Huynh  Procedure(s) Performed: ENDARTERECTOMY CAROTID (Right Neck)  Patient Location: SICU  Anesthesia Type:General  Level of Consciousness: sedated  Airway & Oxygen Therapy: Patient remains intubated per anesthesia plan and Patient placed on Ventilator (see vital sign flow sheet for setting)  Post-op Assessment: Report given to RN and Post -op Vital signs reviewed and stable  Post vital signs: Reviewed and stable  Last Vitals:  Vitals Value Taken Time  BP 152/78 04/22/2018 10:16 AM  Temp    Pulse 104 04/22/2018 10:29 AM  Resp 21 04/22/2018 10:29 AM  SpO2 100 % 04/22/2018 10:29 AM  Vitals shown include unvalidated device data.  Last Pain:  Vitals:   04/22/18 5974  TempSrc:   PainSc: 0-No pain         Complications: No apparent anesthesia complications

## 2018-04-22 NOTE — Op Note (Signed)
Patient name: Brian Huynh MRN: [id number] DOB: [birth year] Sex: male  04/22/2018 Pre-operative Diagnosis: Severe asymptomatic right internal carotid artery stenosis Post-operative diagnosis:  Same Surgeon:  Erlene Quan C. Donzetta Matters, MD Assistant: Laurence Slate, PA Procedure Performed: Right carotid endarterectomy with bovine pericardial patch angioplasty  Indications: 57 year old male with a history of a left brain stroke found to have bilateral high-grade carotid stenosis.  He is previously undergone left carotid endarterectomy now presents for right.  Findings: The lesion was isolated to the internal carotid artery and was quite high and no shunt could be placed.  There was adequate backbleeding.  At completion of a strong signal throughout diastole.  Unfortunately with closing the skin the patient became hypotensive and had a desaturation event requiring epinephrine injection with plans for continued intubation for the short-term.   Procedure:  The patient was identified in the holding area and taken to the operating room where is placed supine the operative table and general anesthesia was induced.  He was sterilely prepped and draped in the right neck chest in the usual fashion antibiotics were administered and a timeout was called.  We began with a longitudinal incision along the anterior border the sternocleidomastoid.  We dissected down this identified some lymph nodes just deep to the platysma.  Two branches of the facial vein were divided between ties.  We identified the common carotid artery the patient was given 10,000 units of heparin and ACT returned greater than 300.  Umbilical tape was placed around the common carotid artery.  We then dissected up along the common to the external carotid artery where we placed a vessel loop around both the external neck as well as a superior thyroid branch.  We then identified our hypoglossal nerve and we did divide the ansa between clips.  There is also a  small crossing vein that was divided between clips higher up on the internal.  We then dissected out our internal carotid artery.  This time the patient did have some bradycardia and Robinul was administered.  We were able to dissected normal area of the internal carotid artery that was quite high after the genu.  With this we were prepared a 10 French shunt and bovine pericardial patch.  After pressure was sustained at a map around 100-110 we then clamped the internal followed by the common and external carotid arteries.  We opened longitudinally where we identified disease isolated to the internal carotid artery and extending quite high.  We are able to get past the disease segment attempted to place the shunt but after 2 attempts I elected to remove the shunt and clamp the internal with a Kitzmiller clamp.  We then commenced with endarterectomy had good feathering distally.  A bovine pericardial patch was sewn in place with 6-0 Prolene suture.  Prior to completion of anastomosis we allowed backbleeding antegrade bleeding and then flushed with heparinized saline.  Upon completion there was a good signal within our ICA.  We did place one repair stitch high and checked signal was rechecked again was as expected.  Satisfied with this 50 mg of protamine was administered we irrigated the wound and obtain hemostasis.  Upon closing the platysma the patient became incredibly hypotensive with systolic pressure of 30 also began desaturations to the 70s and was difficult to bag ventilate.  Epinephrine was administered by anesthesia.  We closed the skin and Dermabond was placed.  Plan was to neurologically check the patient and keep him intubated for the time  being.  He was transferred to the PACU having stabilized from a blood pressure standpoint.  EBL: 50cc    Zanden Colver C. Donzetta Matters, MD [facility] Office: [phone] Pager: [phone]

## 2018-04-23 ENCOUNTER — Encounter (HOSPITAL_COMMUNITY): Payer: Self-pay | Admitting: Vascular Surgery

## 2018-04-23 DIAGNOSIS — I6521 Occlusion and stenosis of right carotid artery: Secondary | ICD-10-CM | POA: Diagnosis not present

## 2018-04-23 LAB — BASIC METABOLIC PANEL
ANION GAP: 2 — AB (ref 5–15)
BUN: 13 mg/dL (ref 6–20)
CO2: 27 mmol/L (ref 22–32)
Calcium: 8.1 mg/dL — ABNORMAL LOW (ref 8.9–10.3)
Chloride: 109 mmol/L (ref 98–111)
Creatinine, Ser: 0.89 mg/dL (ref 0.61–1.24)
GFR calc Af Amer: >60 mL/min (ref >60)
GFR calc non Af Amer: >60 mL/min (ref >60)
Glucose, Bld: 129 mg/dL — ABNORMAL HIGH (ref 70–99)
POTASSIUM: 4 mmol/L (ref 3.5–5.1)
SODIUM: 138 mmol/L (ref 135–145)

## 2018-04-23 LAB — CBC
HCT: 40.8 % (ref 39.0–52.0)
Hemoglobin: 13.7 g/dL (ref 13.0–17.0)
MCH: 32.8 pg (ref 26.0–34.0)
MCHC: 33.6 g/dL (ref 30.0–36.0)
MCV: 97.6 fL (ref 80.0–100.0)
NRBC: 0 % (ref 0.0–0.2)
PLATELETS: 151 10*3/uL (ref 150–400)
RBC: 4.18 MIL/uL — ABNORMAL LOW (ref 4.22–5.81)
RDW: 12.7 % (ref 11.5–15.5)
WBC: 14 10*3/uL — ABNORMAL HIGH (ref 4.0–10.5)

## 2018-04-23 MED ORDER — OXYCODONE-ACETAMINOPHEN 5-325 MG PO TABS: 1.0000 | ORAL_TABLET | Freq: Four times a day (QID) | ORAL | 0 refills | Status: DC | PRN

## 2018-04-23 NOTE — Progress Notes (Addendum)
[facility]  Subjective  - Doing well this am.     Objective 137/82 62 97.9 F (36.6 C) (Oral) 14 93%  Intake/Output Summary (Last 24 hours) at 04/23/2018 0708 Last data filed at 04/23/2018 0400 Gross per 24 hour  Intake 3228.73 ml  Output 1350 ml  Net 1878.73 ml    Right neck incision with mild fullness, no frank hematoma No tongue deviation, smile is symmetric Grip 5/5 B UE, moving all 4 ext Heart RRR Lungs non labored breathing on RA SAT 97 %  Assessment/Planning: POD # Right CEA  Post op reaction to protamine requiring ventalator support, extubated in ICU. Disposition stable this am No neurologic deficits Once he voids, ambulates and tolerates breakfast he will be ready for discharge home. F/U with Dr. Donzetta Matters in 2-3 weeks.  Roxy Horseman 04/23/2018 7:08 AM --  Laboratory Lab Results: Recent Labs    04/22/18 1707 04/23/18 0513  WBC 13.2* 14.0*  HGB 15.3 13.7  HCT 47.3 40.8  PLT 165 151   BMET Recent Labs    04/22/18 1707 04/23/18 0513  NA 135 138  K 4.5 4.0  CL 104 109  CO2 22 27  GLUCOSE 197* 129*  BUN 16 13  CREATININE 1.08 0.89  CALCIUM 8.5* 8.1*    COAG Lab Results  Component Value Date   INR 1.09 04/15/2018   INR 1.07 02/16/2018   INR 1.11 01/26/2018   No results found for: PTT  I have examined the patient, reviewed and agree with above.  Looks good this morning.  Neurologically intact.  Right neck incision with some soreness but no hematoma.  No further episodes regarding hypotension or respiratory issues.  Ate breakfast with no difficulty.  Has voided.  Will walk this morning and then be discharged later today  Curt Jews, MD 04/23/2018 10:04 AM

## 2018-04-23 NOTE — Consult Note (Signed)
NAME:  Brian Huynh, MRN:  [id number], DOB:  1960-07-18, LOS: 1 ADMISSION DATE:  04/22/2018, CONSULTATION DATE:  04/23/2018  REFERRING MD: Dr. Donzetta Matters, CHIEF COMPLAINT: Intubated, mechanical ventilation, shock  Brief History   Outpatient surgery for right carotid endarterectomy, given protamine for heparin reversal and developed anaphylactic.  Past Medical History   Past Medical History:  Diagnosis Date  . Alcohol abuse   . Anxiety   . Cholecystitis   . Chronic back pain    "mostly lower; some in my middle" (01/26/2018)  . CVA (cerebral vascular accident) ([place]) 01/25/2018   bilateral left and right subacute infarcts in the frontal and biparietal area/notes 01/26/2018;  . Daily headache   . Depression   . Hepatitis C   . Hypertension    "not high enough to take RX for it" (01/26/2018)  . Jaundice   . Peripheral vascular disease ([place])    carotid artery stenosis  . Pneumonia    as a child  . Sciatica   . Transaminitis     Significant Hospital Events   10/31 -carotid endarterectomy, right, repeated to the ICU 10/31 -liberation from mechanical ventilation  Consults: date of consult/date signed off & final recs:  CCM-04/22/2018  Procedures (surgical and bedside):  04/22/2018: Symptomatic carotid endarterectomy with bovine pericardial patch angioplasty.  Significant Diagnostic Tests:   Micro Data:  10/24 MRSA PCR Negative Antimicrobials:  10/31: Ancef x 1 dose pre-op Subjective:  States his throat is still sore, but he is able to eat and drink without difficulty. No stridor or swelling noted Objective   Blood pressure 127/76, pulse (!) 59, temperature 97.7 F (36.5 C), resp. rate 15, SpO2 93 %.    Vent Mode: CPAP;PSV FiO2 (%):  [40 %] 40 % Set Rate:  [18 bmp] 18 bmp Vt Set:  [590 mL] 590 mL PEEP:  [5 cmH20] 5 cmH20 Pressure Support:  [5 cmH20] 5 cmH20 Plateau Pressure:  [13 cmH20] 13 cmH20   Intake/Output Summary (Last 24 hours) at 04/23/2018 0948 Last data filed at  04/23/2018 0900 Gross per 24 hour  Intake 3133.38 ml  Output 2600 ml  Net 533.38 ml   There were no vitals filed for this visit.  Post extubation continues to do well. Weaned to RA and has transfer orders per vascular surgery  Examination: General: Middle-aged male, awake and alert, on RA, appropriate HENT: NCAT, sclera clear, tracking appropriately Neck: Right incision, clean dry and intact, minimal swelling/ bruising Lungs: Clear to auscultation bilaterally, no crackles, few  Wheezes RUL Cardiovascular:  S1-S2, RRR, no murmurs or gallops Abdomen: Soft, nontender, nondistended, BS + Extremities: Lower extremity edema, brisk cap refill, otherwise no obvious deformities Neuro: Alert, oriented x 3, following commands no focal deficit, moves all 4 extremities with command Psych: Normal mood affect, non-anxious  Resolved Hospital Problem list    Acute Respiratory Failure 2/2 anaphylactic reaction to protamine  Assessment & Plan:   Acute hypoxemic respiratory failure requiring intubation mechanical ventilation following right carotid endarterectomy by vascular surgery.  Postop during closing patient was given protamine for reversal of heparin and developed significant hypotension, concern for anaphylactic shock Patient is a current every day smoker - Patient was  given steroids as well as antihistamines - He was also given doses of epinephrine. - He is on RA without stridor or any airway swelling, tongue is non-swollen -Shock likely related to anaphylaxis from protamine, now resolved -Patient is  awake alert in the intensive care unit following all commands, discharge has  been written. - Smoking cessation counseling has been done.    Disposition / Summary of Today's Plan 04/23/18   Stable after liberation from ventilator PCCM will sign off  For discharge per vascular surgery service today    Diet: Per surgery service Pain/Anxiety/Delirium protocol (if indicated): Not needed  following extubation VAP protocol (if indicated): Not applicable DVT prophylaxis: Can start per surgery service GI prophylaxis: Not applicable Hyperglycemia protocol: Not applicable Mobility: Bedrest Code Status: Full Family Communication: Communicating directly with patient post extubation  Labs   CBC: Recent Labs  Lab 04/22/18 0934 04/22/18 1707 04/23/18 0513  WBC  --  13.2* 14.0*  HGB 16.7 15.3 13.7  HCT 49.0 47.3 40.8  MCV  --  97.7 97.6  PLT  --  165 409    Basic Metabolic Panel: Recent Labs  Lab 04/22/18 0934 04/22/18 1707 04/23/18 0513  NA 141 135 138  K 3.9 4.5 4.0  CL  --  104 109  CO2  --  22 27  GLUCOSE  --  197* 129*  BUN  --  16 13  CREATININE  --  1.08 0.89  CALCIUM  --  8.5* 8.1*   GFR: Estimated Creatinine Clearance: 104.7 mL/min (by C-G formula based on SCr of 0.89 mg/dL). Recent Labs  Lab 04/22/18 1707 04/23/18 0513  WBC 13.2* 14.0*    Liver Function Tests: No results for input(s): AST, ALT, ALKPHOS, BILITOT, PROT, ALBUMIN in the last 168 hours. No results for input(s): LIPASE, AMYLASE in the last 168 hours. No results for input(s): AMMONIA in the last 168 hours.  ABG    Component Value Date/Time   PHART 7.147 (LL) 04/22/2018 0934   PCO2ART 86.4 (HH) 04/22/2018 0934   PO2ART 98.0 04/22/2018 0934   HCO3 29.9 (H) 04/22/2018 0934   TCO2 32 04/22/2018 0934   ACIDBASEDEF 2.0 04/22/2018 0934   O2SAT 95.0 04/22/2018 0934     Coagulation Profile: No results for input(s): INR, PROTIME in the last 168 hours.  Cardiac Enzymes: No results for input(s): CKTOTAL, CKMB, CKMBINDEX, TROPONINI in the last 168 hours.  HbA1C: Hgb A1c MFr Bld  Date/Time Value Ref Range Status  01/26/2018 11:02 AM 4.6 (L) 4.8 - 5.6 % Final    Comment:    (NOTE) Pre diabetes:          5.7%-6.4% Diabetes:              >6.4% Glycemic control for   <7.0% adults with diabetes     CBG: No results for input(s): GLUCAP in the last 168 hours.  Admitting History  of Present Illness.    This is a 57 year old gentleman that presented to the hospital for an outpatient right carotid endarterectomy for symptom medic disease.  He was found to have also known high-grade stenosis of the right.  He was taken to the operating room in normal fashion.  Postoperatively during closing the patient became hypotensive assume secondary to protamine reversal of heparin.  Patient was given steroids, Benadryl as well as epi in the OR.  Patient was left intubated and transferred to the cardiovascular ICU.  Pulmonary care was consulted for recommendations made regarding vent and evaluation for liberation from the ventilator.   Past Medical History  He,  has a past medical history of Alcohol abuse, Anxiety, Cholecystitis, Chronic back pain, CVA (cerebral vascular accident) ([place]) (01/25/2018), Daily headache, Depression, Hepatitis C, Hypertension, Jaundice, Peripheral vascular disease ([place]), Pneumonia, Sciatica, and Transaminitis.   Surgical History  Past Surgical History:  Procedure Laterality Date  . APPENDECTOMY    . BACK SURGERY    . CAROTID ENDARTERECTOMY Left 02/16/2018   PATCH ANGIOPLASTY  . ENDARTERECTOMY Left 02/16/2018   Procedure: Left Carotid ENDARTERECTOMY;  Surgeon: [name], MD;  Location: [facility];  Service: Vascular;  Laterality: Left;  . ENDARTERECTOMY Right 04/22/2018   Procedure: ENDARTERECTOMY CAROTID;  Surgeon: [name], MD;  Location: [facility];  Service: Vascular;  Laterality: Right;  . INGUINAL HERNIA REPAIR Right   . KNEE ARTHROSCOPY Left   . LUMBAR LAMINECTOMY  X 2  . ORCHIOPEXY Right   . PATCH ANGIOPLASTY  02/16/2018   Procedure: PATCH ANGIOPLASTY;  Surgeon: [name], MD;  Location: [facility] [place] [facility];  Service: Vascular;;     Social History   Social History   Socioeconomic History  . Marital status: Divorced    Spouse name: Not on file  . Number of children: 2  . Years of education: Not on file  .  Highest education level: Not on file  Occupational History  . Not on file  Social Needs  . Financial resource strain: Not on file  . Food insecurity:    Worry: Not on file    Inability: Not on file  . Transportation needs:    Medical: Not on file    Non-medical: Not on file  Tobacco Use  . Smoking status: Current Some Day Smoker    Packs/day: 0.25    Years: 38.00    Pack years: 9.50    Types: Cigarettes  . Smokeless tobacco: Former Systems developer    Types: Snuff, Chew  . 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 Alcohol use: Yes    Alcohol/week: 8.0 standard drinks    Types: 8 Cans of beer per week    Comment: 01/26/2018 "2, 12oz beers, 3-4 days/wk"  . Drug use: Not Currently    Comment: 01/26/2018 "nothing for years"  . Sexual activity: Not Currently  Lifestyle  . Physical activity:    Days per week: Not on file    Minutes per session: Not on file  . Stress: Not on file  Relationships  . Social connections:    Talks on phone: Not on file    Gets together: Not on file    Attends religious service: Not on file    Active member of club or organization: Not on file    Attends meetings of clubs or organizations: Not on file    Relationship status: Not on file  . Intimate partner violence:    Fear of current or ex partner: Not on file    Emotionally abused: Not on file    Physically abused: Not on file    Forced sexual activity: Not on file  Other Topics Concern  . Not on file  Social History Narrative  . Not on file  ,  reports that he has been smoking cigarettes. He has a 9.50 pack-year smoking history. He has quit using smokeless tobacco.  His smokeless tobacco use included snuff and chew. He reports that he drinks about 8.0 standard drinks of alcohol per week. He reports that he has current or past drug history.   Family History   His family history includes AAA (abdominal aortic aneurysm) in his father; Diabetes in his mother;  Lupus in his mother; Stroke in his brother.   Allergies Allergies  Allergen Reactions  . Protamine Anaphylaxis    Intraoperative  anaphylactic type reaction following protamine administration  . Voltaren [Diclofenac] Rash    Voltaren gel caused red bumps on the skin     Home Medications  Prior to Admission medications   Medication Sig Start Date End Date Taking? Authorizing Provider  amLODipine (NORVASC) 10 MG tablet Take 10 mg by mouth daily.   Yes [provider]  aspirin 325 MG tablet Take 325 mg by mouth daily.   Yes [provider]  atorvastatin (LIPITOR) 40 MG tablet Take 1 tablet (40 mg total) by mouth daily at 6 PM. 02/03/18 04/13/18 Yes Donne Hazel, MD  citalopram (CELEXA) 40 MG tablet Take 40 mg by mouth daily.   Yes [provider]  clopidogrel (PLAVIX) 75 MG tablet Take 1 tablet (75 mg total) by mouth daily. 04/09/18  Yes [name], MD  gabapentin (NEURONTIN) 100 MG capsule Take 2 capsules (200 mg total) by mouth at bedtime. 02/03/18 04/13/18 Yes Donne Hazel, MD  traZODone (DESYREL) 50 MG tablet Take 50 mg by mouth at bedtime.    Yes [provider]  meloxicam (MOBIC) 15 MG tablet Take 15 mg by mouth daily as needed for pain.    [provider]  oxyCODONE-acetaminophen (PERCOCET) 5-325 MG tablet Take 1 tablet by mouth every 6 (six) hours as needed for severe pain. Patient not taking: Reported on 04/13/2018 02/17/18   Gabriel Earing, PA-C     This patient is critically ill with multiple organ system failure; which, requires frequent high complexity decision making, assessment, support, evaluation, and titration of therapies. This was completed through the application of advanced monitoring technologies and extensive interpretation of multiple databases. During this encounter critical care time was devoted to patient care services described in this note for 32 minutes.   Magdalen Spatz, AGACNP-BC [facility]  Pulmonary Critical Care 04/23/2018 9:48 AM  CCM  [phone]  04/23/2018 9:48 AM

## 2018-04-23 NOTE — Care Management Note (Signed)
Case Management Note  Patient Details  Name: Brian Huynh MRN: [id number] Date of Birth: 09/24/1960  Subjective/Objective: 57yo male presented for scheduled surgery for R CEA, developed acute hypoxemic respiratory failure requiring intubation.                   Action/Plan: CM met with patient to discuss transitional needs. Patient was living at the [facility], but reports has since been "kicked out." Patient stated his brother will provide transportation at discharge; he will reside at his mother's home post transition, then move unto his apartment on 04/26/18. Patient verbalized being approved for Section 8 for housing, with assistance with his utilities to be provided by [facility]. Pt verified no health Insurance, but he does utilize the [facility] in [place]. Ralene Bathe is the CSW at [phone]. CM assessed if assistance was needed with Rx costs, with patient stating his brother will be able to assist. CM will fax patient's DC summary to [facility]. No further needs from CM.   Expected Discharge Date:  04/23/18               Expected Discharge Plan:  Home/Self Care  In-House Referral:  NA  Discharge planning Services  CM Consult  Post Acute Care Choice:  NA Choice offered to:  NA  DME Arranged:  N/A DME Agency:  NA  HH Arranged:  NA HH Agency:  NA  Status of Service:  Completed, signed off  If discussed at [place] of Stay Meetings, dates discussed:    Additional Comments:  Midge Minium RN, BSN, NCM-BC, ACM-RN [phone] 04/23/2018, 11:20 AM

## 2018-04-26 NOTE — Discharge Summary (Signed)
[facility] Discharge Summary   Patient ID:  Brian Huynh MRN: [id number] DOB/AGE: 28-Jun-1960 57 y.o.  Admit date: 04/22/2018 Discharge date: 04/23/2018 Date of Surgery: 04/22/2018 Surgeon: Surgeon(s): [name], MD  Admission Diagnosis: S/P carotid endarterectomy [Z98.890]  Discharge Diagnoses:  S/P carotid endarterectomy [Z98.890]  Secondary Diagnoses: Past Medical History:  Diagnosis Date  . Alcohol abuse   . Anxiety   . Cholecystitis   . Chronic back pain    "mostly lower; some in my middle" (01/26/2018)  . CVA (cerebral vascular accident) ([place]) 01/25/2018   bilateral left and right subacute infarcts in the frontal and biparietal area/notes 01/26/2018;  . Daily headache   . Depression   . Hepatitis C   . Hypertension    "not high enough to take RX for it" (01/26/2018)  . Jaundice   . Peripheral vascular disease ([place])    carotid artery stenosis  . Pneumonia    as a child  . Sciatica   . Transaminitis     Procedure(s): ENDARTERECTOMY CAROTID  Discharged Condition: stable  HPI: This is a 57 y.o. male without previous vascular history although he does have left lower extremity pain and his been told that he has blockages.  Presented with right upper extremity weakness and right lower extremity numbness.  Found to have bilateral strokes on MRI.  He has high-grade bilateral carotid stenosis and will need bilateral carotid endarterectomies. Will plan for left CEA.   Hospital Course:  Brian Huynh is a 57 y.o. male is S/P Left Procedure(s): ENDARTERECTOMY CAROTID  Post op reaction to protamine requiring ventalator support, extubated in ICU. Disposition stable this am No neurologic deficits Once he voids, ambulates and tolerates breakfast he will be ready for discharge home. F/U with Dr. Donzetta Matters in 2-3 weeks.   Significant Diagnostic Studies: CBC Lab Results  Component Value Date   WBC 14.0 (H) 04/23/2018   HGB 13.7  04/23/2018   HCT 40.8 04/23/2018   MCV 97.6 04/23/2018   PLT 151 04/23/2018    BMET    Component Value Date/Time   NA 138 04/23/2018 0513   K 4.0 04/23/2018 0513   CL 109 04/23/2018 0513   CO2 27 04/23/2018 0513   GLUCOSE 129 (H) 04/23/2018 0513   BUN 13 04/23/2018 0513   CREATININE 0.89 04/23/2018 0513   CALCIUM 8.1 (L) 04/23/2018 0513   GFRNONAA >60 04/23/2018 0513   GFRAA >60 04/23/2018 0513   COAG Lab Results  Component Value Date   INR 1.09 04/15/2018   INR 1.07 02/16/2018   INR 1.11 01/26/2018     Disposition:  Discharge to :Home Discharge Instructions    Call MD for:  redness, tenderness, or signs of infection (pain, swelling, bleeding, redness, odor or green/yellow discharge around incision site)   Complete by:  As directed    Call MD for:  severe or increased pain, loss or decreased feeling  in affected limb(s)   Complete by:  As directed    Call MD for:  temperature >100.5   Complete by:  As directed    Resume previous diet   Complete by:  As directed      Allergies as of 04/23/2018      Reactions   Protamine Anaphylaxis   Intraoperative anaphylactic type reaction following protamine administration   Voltaren [diclofenac] Rash   Voltaren gel caused red bumps on the skin      Medication List    TAKE these medications   amLODipine  10 MG tablet Commonly known as:  NORVASC Take 10 mg by mouth daily.   aspirin 325 MG tablet Take 325 mg by mouth daily.   atorvastatin 40 MG tablet Commonly known as:  LIPITOR Take 1 tablet (40 mg total) by mouth daily at 6 PM.   citalopram 40 MG tablet Commonly known as:  CELEXA Take 40 mg by mouth daily.   clopidogrel 75 MG tablet Commonly known as:  PLAVIX Take 1 tablet (75 mg total) by mouth daily.   gabapentin 100 MG capsule Commonly known as:  NEURONTIN Take 2 capsules (200 mg total) by mouth at bedtime.   meloxicam 15 MG tablet Commonly known as:  MOBIC Take 15 mg by mouth daily as needed for  pain.   oxyCODONE-acetaminophen 5-325 MG tablet Commonly known as:  PERCOCET/ROXICET Take 1 tablet by mouth every 6 (six) hours as needed for severe pain. What changed:  Another medication with the same name was added. Make sure you understand how and when to take each.   oxyCODONE-acetaminophen 5-325 MG tablet Commonly known as:  PERCOCET/ROXICET Take 1 tablet by mouth every 6 (six) hours as needed. What changed:  You were already taking a medication with the same name, and this prescription was added. Make sure you understand how and when to take each.   traZODone 50 MG tablet Commonly known as:  DESYREL Take 50 mg by mouth at bedtime.      Verbal and written Discharge instructions given to the patient. Wound care per Discharge AVS Follow-up Information    [name], MD In 2 weeks.   Specialties:  Vascular Surgery, Cardiology Why:  Office will call you to arrange your appt (sent) Contact information: [place] [address] [phone]           Signed: Roxy Horseman 04/26/2018, 8:26 AM --- For VQI Registry use --- Instructions: Press F2 to tab through selections.  Delete question if not applicable.   Modified Rankin score at D/C (0-6): Rankin Score=0  IV medication needed for:  1. Hypertension: No 2. Hypotension: Yes  Post-op Complications: Yes protamine reaction with hypotension  1. Post-op CVA or TIA: No  If yes: Event classification (right eye, left eye, right cortical, left cortical, verterobasilar, other):   If yes: Timing of event (intra-op, <6 hrs post-op, >=6 hrs post-op, unknown):   2. CN injury: No  If yes: CN  injuried   3. Myocardial infarction: No  If yes: Dx by (EKG or clinical, Troponin):   4.  CHF: No  5.  Dysrhythmia (new): No  6. Wound infection: No  7. Reperfusion symptoms: No  8. Return to OR: No  If yes: return to OR for (bleeding, neurologic, other CEA incision, other):   Discharge  medications: Statin use:  Yes ASA use:  Yes Beta blocker use:  No  for medical reason   ACE-Inhibitor use:  Yes P2Y12 Antagonist use: [ ]  None, [x ] Plavix, [ ]  Plasugrel, [ ]  Ticlopinine, [ ]  Ticagrelor, [ ]  Other, [ ]  No for medical reason, [ ]  Non-compliant, [ ]  Not-indicated Anti-coagulant use:  [x ] None, [ ]  Warfarin, [ ]  Rivaroxaban, [ ]  Dabigatran, [ ]  Other, [ ]  No for medical reason, [ ]  Non-compliant, [ ]  Not-indicated

## 2018-05-14 ENCOUNTER — Ambulatory Visit (INDEPENDENT_AMBULATORY_CARE_PROVIDER_SITE_OTHER): Payer: Self-pay | Admitting: Vascular Surgery

## 2018-05-14 ENCOUNTER — Encounter: Payer: Self-pay | Admitting: Vascular Surgery

## 2018-05-14 ENCOUNTER — Other Ambulatory Visit: Payer: Self-pay

## 2018-05-14 VITALS — BP 137/86 | HR 88 | Temp 98.4°F | Resp 18 | Ht 70.0 in | Wt 200.0 lb

## 2018-05-14 DIAGNOSIS — I63233 Cerebral infarction due to unspecified occlusion or stenosis of bilateral carotid arteries: Secondary | ICD-10-CM

## 2018-05-14 NOTE — Progress Notes (Signed)
    Subjective:     Patient ID: Brian Huynh, male   DOB: 21-Oct-1960, 57 y.o.   MRN: [id number]  HPI 57 year old male has now undergone bilateral carotid endarterectomies.  He first had symptomatic left carotid stenosis.  Symptoms have mostly resolved from his stroke.  He has not had any issues with either carotid endarterectomy.  He is fully recovered now.  Swallowing fine voices at baseline.  Wounds are healing well.   Review of Systems No complaints today.    Objective:   Physical Exam Awake alert oriented Moving all extremities well Tongue is midline Left carotid incision healed right is healing well clean dry intact    Assessment:     57 year old male status post bilateral carotid endarterectomies.  Peripheral arterial disease in his bilateral lower extremities followed at the [facility].    Plan:     Follow-up 9 to 12 months with carotid duplex.  He is on aspirin Plavix.  He also takes a statin.  If there are any issues in the interim we will see him sooner.  Brandon C. Donzetta Matters, MD [facility] Office: [phone] Pager: [phone]

## 2018-05-23 DIAGNOSIS — S8263XA Displaced fracture of lateral malleolus of unspecified fibula, initial encounter for closed fracture: Secondary | ICD-10-CM

## 2018-05-23 HISTORY — DX: Displaced fracture of lateral malleolus of unspecified fibula, initial encounter for closed fracture: S82.63XA

## 2018-05-27 ENCOUNTER — Encounter (HOSPITAL_COMMUNITY): Payer: Self-pay | Admitting: Emergency Medicine

## 2018-05-27 ENCOUNTER — Emergency Department (HOSPITAL_COMMUNITY)
Admission: EM | Admit: 2018-05-27 | Discharge: 2018-05-27 | Disposition: A | Discharge status: Home / Self Care | Payer: Non-veteran care | Attending: Emergency Medicine | Admitting: Emergency Medicine

## 2018-05-27 ENCOUNTER — Other Ambulatory Visit: Payer: Self-pay

## 2018-05-27 ENCOUNTER — Emergency Department (HOSPITAL_COMMUNITY): Payer: Non-veteran care

## 2018-05-27 DIAGNOSIS — Y999 Unspecified external cause status: Secondary | ICD-10-CM | POA: Insufficient documentation

## 2018-05-27 DIAGNOSIS — X501XXA Overexertion from prolonged static or awkward postures, initial encounter: Secondary | ICD-10-CM | POA: Diagnosis not present

## 2018-05-27 DIAGNOSIS — Z79899 Other long term (current) drug therapy: Secondary | ICD-10-CM | POA: Diagnosis not present

## 2018-05-27 DIAGNOSIS — Y939 Activity, unspecified: Secondary | ICD-10-CM | POA: Diagnosis not present

## 2018-05-27 DIAGNOSIS — Y929 Unspecified place or not applicable: Secondary | ICD-10-CM | POA: Diagnosis not present

## 2018-05-27 DIAGNOSIS — Z7982 Long term (current) use of aspirin: Secondary | ICD-10-CM | POA: Insufficient documentation

## 2018-05-27 DIAGNOSIS — I10 Essential (primary) hypertension: Secondary | ICD-10-CM | POA: Diagnosis not present

## 2018-05-27 DIAGNOSIS — S82832A Other fracture of upper and lower end of left fibula, initial encounter for closed fracture: Secondary | ICD-10-CM | POA: Insufficient documentation

## 2018-05-27 DIAGNOSIS — F17210 Nicotine dependence, cigarettes, uncomplicated: Secondary | ICD-10-CM | POA: Insufficient documentation

## 2018-05-27 MED ORDER — ROXICODONE 5 MG PO TABS: 5.0000 mg | ORAL_TABLET | Freq: Four times a day (QID) | ORAL | 0 refills | Status: DC | PRN

## 2018-05-27 MED ORDER — OXYCODONE HCL 5 MG PO TABS
5.0000 mg | ORAL_TABLET | Freq: Once | ORAL | Status: AC
  Administered 2018-05-27: 5 mg via ORAL
  Filled 2018-05-27: qty 1

## 2018-05-27 MED ORDER — TETANUS-DIPHTH-ACELL PERTUSSIS 5-2.5-18.5 LF-MCG/0.5 IM SUSP
0.5000 mL | Freq: Once | INTRAMUSCULAR | Status: AC
  Administered 2018-05-27: 0.5 mL via INTRAMUSCULAR
  Filled 2018-05-27: qty 0.5

## 2018-05-27 NOTE — ED Notes (Signed)
Reviewed d/c instructions with pt, who verbalized understanding and had no outstanding questions. Pt departed in NAD.   

## 2018-05-27 NOTE — Discharge Instructions (Signed)
Please see the information and instructions below regarding your visit.  Your diagnoses today include:  1. Closed avulsion fracture of distal end of left fibula, initial encounter     Tests performed today include: See side panel of your discharge paperwork for testing performed today. Vital signs are listed at the bottom of these instructions.   Medications prescribed:    Take any prescribed medications only as prescribed, and any over the counter medications only as directed on the packaging.  You have been prescribed Oxycodone for pain. This is an opioid pain medication. You may take a half to whole pill every 6 this medication every 4-6 hours as needed for pain. Only take this medication if you need it for breakthrough pain.  Do not combine this medication with Tylenol, as it may increase the risk of liver problems.  Do not combine this medication with alcohol.  Please be advised to avoid driving or operating heavy machinery while taking this medication, as it may make you drowsy or impair judgment.    Home care instructions:  Please follow any educational materials contained in this packet.   Please wear your crutches at all times when up and about.  Do not bear weight on the ankle until you see the orthopedic doctor.  Please make sure that you keep the splint dry.  Please place plastic over it when you are bathing.  Follow-up instructions: Please follow-up with Dr. Erlinda Hong of [facility] as soon as possible. Please call first thing tomorrow morning.   Return instructions:  Please return to the Emergency Department if you experience worsening symptoms.  Please return to the emergency department if you develop any worsening pain, numbness or tingling in your left toes, any changes in color in your toes were they are turning a light color, or any new or worsening symptoms. Please return if you have any other emergent concerns.  Additional Information:   Your vital signs  today were: BP 119/72 (BP Location: Right Arm)    Pulse 69    Temp 98.6 F (37 C) (Oral)    Resp 14    Ht 5\' 10"  (1.778 m)    Wt 90.7 kg    SpO2 95%    BMI 28.70 kg/m  If your blood pressure (BP) was elevated on multiple readings during this visit above 130 for the top number or above 80 for the bottom number, please have this repeated by your primary care provider within one month. --------------  Thank you for allowing [place] to participate in your care today.

## 2018-05-27 NOTE — Progress Notes (Signed)
Orthopedic Tech Progress Note Patient Details:  Brian Huynh 1960/09/30 [id number]  Ortho Devices Type of Ortho Device: Ace wrap, Short leg splint, Crutches Ortho Device/Splint Interventions: Application   Post Interventions Patient Tolerated: Well Instructions Provided: Care of device   [name] 05/27/2018, 5:58 PM

## 2018-05-27 NOTE — ED Provider Notes (Signed)
[facility] EMERGENCY DEPARTMENT Provider Note   CSN: [id number] Arrival date & time: 05/27/18  1505     History   Chief Complaint Chief Complaint  Patient presents with  . Ankle Pain    HPI Brian Huynh is a 57 y.o. male.  HPI  Patient is a 57 year old male with a history of CVA, status post bilateral carotid endarterectomy, currently on ASA and Plavix, hepatitis C, alcohol use, presenting for left foot and ankle injury.  Patient reports that he "rolled his ankle" approximately 1 week ago when stepping off of the curve.  Reports that he fell to the ground and caught himself with his left hand.  Denies any left wrist pain at this time.  Patient reports that he has had progressive difficulty applying weight on the ankle, as well as erythema and bruising developing along the forefoot, midfoot, and extending up his left shin.  Patient denies loss of sensation.  He reports that the skin is painful to touch.  Patient denies any remedies for his symptoms.  Unknown break in the skin, and unknown last tetanus shot.  Past Medical History:  Diagnosis Date  . Alcohol abuse   . Anxiety   . Cholecystitis   . Chronic back pain    "mostly lower; some in my middle" (01/26/2018)  . CVA (cerebral vascular accident) ([place]) 01/25/2018   bilateral left and right subacute infarcts in the frontal and biparietal area/notes 01/26/2018;  . Daily headache   . Depression   . Hepatitis C   . Hypertension    "not high enough to take RX for it" (01/26/2018)  . Jaundice   . Peripheral vascular disease ([place])    carotid artery stenosis  . Pneumonia    as a child  . Sciatica   . Transaminitis     Patient Active Problem List   Diagnosis Date Noted  . S/P carotid endarterectomy 04/22/2018  . Carotid stenosis 04/22/2018  . Symptomatic carotid artery stenosis, left 02/16/2018  . Tobacco abuse   . Essential hypertension   . IV drug abuse ([place])   . Thrombocytopenia ([employer])   . CVA (cerebral  vascular accident) ([place]) 01/26/2018  . Restless legs syndrome 05/13/2013  . Special screening for malignant neoplasms, colon 05/13/2013  . Hepatitis C, acute 03/31/2013  . ETOH abuse 03/29/2013  . Cholecystitis, acute 03/29/2013  . Transaminitis 03/28/2013    Past Surgical History:  Procedure Laterality Date  . APPENDECTOMY    . BACK SURGERY    . CAROTID ENDARTERECTOMY Left 02/16/2018   PATCH ANGIOPLASTY  . ENDARTERECTOMY Left 02/16/2018   Procedure: Left Carotid ENDARTERECTOMY;  Surgeon: [name], MD;  Location: [facility];  Service: Vascular;  Laterality: Left;  . ENDARTERECTOMY Right 04/22/2018   Procedure: ENDARTERECTOMY CAROTID;  Surgeon: [name], MD;  Location: [facility];  Service: Vascular;  Laterality: Right;  . INGUINAL HERNIA REPAIR Right   . KNEE ARTHROSCOPY Left   . LUMBAR LAMINECTOMY  X 2  . ORCHIOPEXY Right   . PATCH ANGIOPLASTY  02/16/2018   Procedure: PATCH ANGIOPLASTY;  Surgeon: [name], MD;  Location: [facility];  Service: Vascular;;        Home Medications    Prior to Admission medications   Medication Sig Start Date End Date Taking? Authorizing Provider  amLODipine (NORVASC) 10 MG tablet Take 10 mg by mouth daily.    [provider]  aspirin 325 MG tablet Take 325 mg by mouth daily.  [provider]  atorvastatin (LIPITOR) 40 MG tablet Take 1 tablet (40 mg total) by mouth daily at 6 PM. 02/03/18 04/13/18  Donne Hazel, MD  citalopram (CELEXA) 40 MG tablet Take 40 mg by mouth daily.    [provider]  clopidogrel (PLAVIX) 75 MG tablet Take 1 tablet (75 mg total) by mouth daily. 04/09/18   [name], MD  gabapentin (NEURONTIN) 100 MG capsule Take 2 capsules (200 mg total) by mouth at bedtime. 02/03/18 04/13/18  Donne Hazel, MD  meloxicam (MOBIC) 15 MG tablet Take 15 mg by mouth daily as needed for pain.    [provider]  oxyCODONE-acetaminophen (PERCOCET) 5-325  MG tablet Take 1 tablet by mouth every 6 (six) hours as needed for severe pain. Patient not taking: Reported on 05/14/2018 02/17/18   Gabriel Earing, PA-C  oxyCODONE-acetaminophen (PERCOCET/ROXICET) 5-325 MG tablet Take 1 tablet by mouth every 6 (six) hours as needed. Patient not taking: Reported on 05/14/2018 04/23/18   Ulyses Amor, PA-C  traZODone (DESYREL) 50 MG tablet Take 50 mg by mouth at bedtime.     [provider]    Family History Family History  Problem Relation Age of Onset  . Stroke Brother        x 2 brothers  . AAA (abdominal aortic aneurysm) Father   . Diabetes Mother   . Lupus Mother     Social History Social History   Tobacco Use  . Smoking status: Current Some Day Smoker    Packs/day: 0.25    Years: 38.00    Pack years: 9.50    Types: Cigarettes  . Smokeless tobacco: Former Systems developer    Types: Snuff, Chew  . 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 Alcohol use: Yes    Alcohol/week: 8.0 standard drinks    Types: 8 Cans of beer per week    Comment: 01/26/2018 "2, 12oz beers, 3-4 days/wk"  . Drug use: Not Currently    Comment: 01/26/2018 "nothing for years"     Allergies   Protamine and Voltaren [diclofenac]   Review of Systems Review of Systems  Constitutional: Negative for chills and fever.  Musculoskeletal: Positive for arthralgias and joint swelling.  Skin: Positive for color change. Negative for pallor and wound.  Neurological: Positive for numbness. Negative for weakness.     Physical Exam Updated Vital Signs BP 140/87 (BP Location: Right Arm)   Pulse 77   Temp 98.6 F (37 C) (Oral)   Resp 16   Ht 5\' 10"  (1.778 m)   Wt 90.7 kg   SpO2 96%   BMI 28.70 kg/m   Physical Exam  Constitutional: He appears well-developed and well-nourished. No distress.  Sitting comfortably in bed.  HENT:  Head: Normocephalic and atraumatic.  Eyes: Conjunctivae are normal. Right eye exhibits no  discharge. Left eye exhibits no discharge.  EOMs normal to gross examination.  Neck: Normal range of motion.  Cardiovascular: Normal rate and regular rhythm.  Intact, 2+ DP and PT pulses of LLE found on doppler.   Pulmonary/Chest:  Normal respiratory effort. Patient converses comfortably. No audible wheeze or stridor.  Abdominal: He exhibits no distension.  Musculoskeletal: He exhibits edema.  See clinical photo for details of left lower extremity.  Patient has rubor from pretibial region extending down across midfoot. Ecchymosis noted to the left medial instep. No ecchymosis in a Lisfranc pattern.  No tenderness to palpation over medial malleolus.  Patient has exquisite tenderness to lateral malleolus.  Patient has mild tenderness palpation of the metatarsals.  No tenderness to palpation of the fibular head.  Compartments are soft of the left lower extremity.  Patient is able to flex and extend ankle proximally 20 degrees in either direction.  Capillary refill is less than 2 seconds distally in the left lower extremity.  Pulses obtained with Doppler.  Neurological: He is alert.  Cranial nerves intact to gross observation. Patient moves extremities without difficulty.  Skin: Skin is warm and dry. He is not diaphoretic.  Psychiatric: He has a normal mood and affect. His behavior is normal. Judgment and thought content normal.  Nursing note and vitals reviewed.        ED Treatments / Results  Labs (all labs ordered are listed, but only abnormal results are displayed) Labs Reviewed - No data to display  EKG None  Radiology Dg Tibia/fibula Left  Result Date: 05/27/2018 CLINICAL DATA:  Recent fall with known fibular fracture EXAM: LEFT TIBIA AND FIBULA - 2 VIEW COMPARISON:  None. FINDINGS: Oblique fracture of the distal fibula is again noted. No other fracture is seen. Lateral soft tissue swelling about the ankle is noted. IMPRESSION: Distal fibular fracture Electronically Signed   By:  Inez Catalina M.D.   On: 05/27/2018 16:22   Dg Ankle Complete Left  Result Date: 05/27/2018 CLINICAL DATA:  Recent fall with lateral ankle pain, initial encounter EXAM: LEFT ANKLE COMPLETE - 3+ VIEW COMPARISON:  None. FINDINGS: Oblique fracture through the distal fibula is noted with mild displacement and mild soft tissue swelling. The talus is somewhat laterally subluxed with respect to the distal tibia. No other definitive fracture is seen. IMPRESSION: Distal fibular fracture with mild lateral subluxation of the talus. Electronically Signed   By: Inez Catalina M.D.   On: 05/27/2018 16:22   Dg Foot Complete Left  Result Date: 05/27/2018 CLINICAL DATA:  Recent fall with left foot pain, initial encounter EXAM: LEFT FOOT - COMPLETE 3+ VIEW COMPARISON:  None. FINDINGS: Distal fibular fracture is seen but incompletely evaluated on this exam. No other fracture is identified. No soft tissue changes are seen. IMPRESSION: Incomplete evaluation of a distal fibular fracture. Electronically Signed   By: Inez Catalina M.D.   On: 05/27/2018 16:21    Procedures Procedures (including critical care time)  Medications Ordered in ED Medications  Tdap (BOOSTRIX) injection 0.5 mL (has no administration in time range)     Initial Impression / Assessment and Plan / ED Course  I have reviewed the triage vital signs and the nursing notes.  Pertinent labs & imaging results that were available during my care of the patient were reviewed by me and considered in my medical decision making (see chart for details).  Clinical Course as of May 28 1831  Thu May 27, 2018  1642 Patient verbally verified a safe ride from the ED. Proceeded with prescribing oxycodone for pain/relaxtion/muscle relaxation in the ED.   [AM]    Clinical Course User Index [AM] Albesa Seen, PA-C    Patient nontoxic-appearing, afebrile, and neurovascularly intact in the left lower extremity.  Patient was injury 1 week ago, and intact distal  pulses and soft compartments of lower extremities.  Feel that injury is low risk for compartment syndrome at this time.  Based on injury pattern, do not feel that the erythema of the lower extremities indicative of cellulitis. No systemic symptoms.   Radiographs demonstrating distal fibular fracture.  Mild lateral  displacement of the talus, however the joint is not unstable.  Case discussed with Dr. Eduard Roux of [facility] orthopedics who recommends placing patient in posterior splint, and having him follow-up in the office.  Appreciate his involvement in the care of this patient.  I have reviewed the patient's information in the [facility] for the past 12 months and found them to have no overlapping Rx.  Opiates were prescribed for an acute, painful condition. The patient was given information on side effects and encouraged to use other, non-opiate pain medication primary, only using opiate medicine sparingly for severe pain.  Patient was instructed not to drive, drink alcohol, or operate machinery while taking this medication.  Return precautions were given to patient for any increasing pain, pallor, paresthesias, or any new or worsening symptoms.  Patient is in understanding and agrees with the plan of care.  Final Clinical Impressions(s) / ED Diagnoses   Final diagnoses:  Closed avulsion fracture of distal end of left fibula, initial encounter    ED Discharge Orders         Ordered    ROXICODONE 5 MG immediate release tablet  Every 6 hours PRN     05/27/18 1838           Albesa Seen, PA-C 05/27/18 1841    Virgel Manifold, MD 05/28/18 1542

## 2018-05-27 NOTE — ED Triage Notes (Signed)
Pt reports having rolled his ankle when he slipped off a curb last week. Has been taking tylenol without improvement. Bruising noted to toes. LLE painful almost to his knee.

## 2018-05-28 ENCOUNTER — Encounter (INDEPENDENT_AMBULATORY_CARE_PROVIDER_SITE_OTHER): Payer: Self-pay | Admitting: Family Medicine

## 2018-05-28 ENCOUNTER — Ambulatory Visit (INDEPENDENT_AMBULATORY_CARE_PROVIDER_SITE_OTHER): Payer: Self-pay | Admitting: Family Medicine

## 2018-05-28 DIAGNOSIS — S8261XA Displaced fracture of lateral malleolus of right fibula, initial encounter for closed fracture: Secondary | ICD-10-CM

## 2018-05-28 MED ORDER — HYDROCODONE-ACETAMINOPHEN 5-325 MG PO TABS: 1.0000 | ORAL_TABLET | Freq: Four times a day (QID) | ORAL | 0 refills | Status: AC | PRN

## 2018-05-28 NOTE — Progress Notes (Signed)
Office Visit Note   Patient: Brian Huynh           Date of Birth: 1960/09/30           MRN: [id number] Visit Date: 05/28/2018 Requested by: [facility] [address] [place], [place] [address] PCP: [facility]  Subjective: Chief Complaint  Patient presents with  . Left Ankle - Pain    Rolled ankle over when he stepped backward and off a curb 1 week ago. Went to ED yesterday and was placed in stirrup SLS, NWB with crutches.    HPI: He is a 57 year old with left ankle fracture.  About 1 week ago he was standing outside, lost his balance and stepped awkwardly off a curb and twisted his left ankle.  Immediate pain and rapid swelling.  Able to bear weight, did not seek treatment immediately.  Went to the ER yesterday due to continued pain and x-rays revealed a lateral malleolus fracture.  He was placed in a posterior splint and on crutches and now presents for evaluation.  He was given oxycodone and took all 6 of them because they were not helping.  He has a history of right tibia fracture which took about 5 months to heal.  No previous problems with his left ankle.  He is a chronic smoker.  He has had a stroke and he also has hypertension and thrombocytopenia.               ROS: Other systems were reviewed and are negative.  Objective: Vital Signs: There were no vitals taken for this visit.  Physical Exam:  Left ankle: Slight tenderness near the proximal fibula, 2+ swelling of the soft tissues around the ankle.  There is erythema medially and laterally to the malleoli.  There is bruising and swelling in the forefoot near the toes.  Very tender on the posterior aspect of the medial and lateral malleoli.  He is neurovascularly intact, no skin breakdown.  No blistering.  Imaging: None today, but x-rays were reviewed from the hospital yesterday.  He has a slightly displaced lateral malleolus fracture at the level of the syndesmosis.  I question  whether he might have a nondisplaced posterior malleolus fracture on the lateral view.  No proximal fibula fracture seen.  No fracture in the foot seen.  Assessment & Plan: 1.  1 week status post fall with left ankle lateral malleolus fracture, slightly displaced but adequately aligned.  Question posterior malleolus fracture as well. -Nonweightbearing, switch to a fracture boot.  Hydrocodone to use sparingly.  Follow-up in 1 week for 3 view ankle x-rays to assess fracture alignment. -Strongly encouraged him to quit smoking.  Asked him to start taking vitamin D3.   Follow-Up Instructions: Return in about 1 week (around 06/04/2018).      Procedures: No procedures performed  No notes on file    PMFS History: Patient Active Problem List   Diagnosis Date Noted  . S/P carotid endarterectomy 04/22/2018  . Carotid stenosis 04/22/2018  . Symptomatic carotid artery stenosis, left 02/16/2018  . Tobacco abuse   . Essential hypertension   . IV drug abuse ([place])   . Thrombocytopenia ([place])   . CVA (cerebral vascular accident) ([place]) 01/26/2018  . Restless legs syndrome 05/13/2013  . Special screening for malignant neoplasms, colon 05/13/2013  . Hepatitis C, acute 03/31/2013  . ETOH abuse 03/29/2013  . Cholecystitis, acute 03/29/2013  . Transaminitis 03/28/2013   Past Medical History:  Diagnosis Date  .  Alcohol abuse   . Anxiety   . Cholecystitis   . Chronic back pain    "mostly lower; some in my middle" (01/26/2018)  . CVA (cerebral vascular accident) ([place]) 01/25/2018   bilateral left and right subacute infarcts in the frontal and biparietal area/notes 01/26/2018;  . Daily headache   . Depression   . Hepatitis C   . Hypertension    "not high enough to take RX for it" (01/26/2018)  . Jaundice   . Peripheral vascular disease ([place])    carotid artery stenosis  . Pneumonia    as a child  . Sciatica   . Transaminitis     Family History  Problem Relation Age of Onset  . Stroke Brother          x 2 brothers  . AAA (abdominal aortic aneurysm) Father   . Diabetes Mother   . Lupus Mother     Past Surgical History:  Procedure Laterality Date  . APPENDECTOMY    . BACK SURGERY    . CAROTID ENDARTERECTOMY Left 02/16/2018   PATCH ANGIOPLASTY  . ENDARTERECTOMY Left 02/16/2018   Procedure: Left Carotid ENDARTERECTOMY;  Surgeon: [name], MD;  Location: [facility];  Service: Vascular;  Laterality: Left;  . ENDARTERECTOMY Right 04/22/2018   Procedure: ENDARTERECTOMY CAROTID;  Surgeon: [name], MD;  Location: [facility];  Service: Vascular;  Laterality: Right;  . INGUINAL HERNIA REPAIR Right   . KNEE ARTHROSCOPY Left   . LUMBAR LAMINECTOMY  X 2  . ORCHIOPEXY Right   . PATCH ANGIOPLASTY  02/16/2018   Procedure: PATCH ANGIOPLASTY;  Surgeon: [name], MD;  Location: [facility];  Service: Vascular;;   Social History   Occupational History  . Not on file  Tobacco Use  . Smoking status: Current Some Day Smoker    Packs/day: 0.25    Years: 38.00    Pack years: 9.50    Types: Cigarettes  . Smokeless tobacco: Former Systems developer    Types: Snuff, Chew  . 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 Alcohol use: Yes    Alcohol/week: 8.0 standard drinks    Types: 8 Cans of beer per week    Comment: 01/26/2018 "2, 12oz beers, 3-4 days/wk"  . Drug use: Not Currently    Comment: 01/26/2018 "nothing for years"  . Sexual activity: Not Currently

## 2018-06-03 ENCOUNTER — Ambulatory Visit (INDEPENDENT_AMBULATORY_CARE_PROVIDER_SITE_OTHER): Payer: Self-pay | Admitting: Family Medicine

## 2018-06-03 ENCOUNTER — Other Ambulatory Visit: Payer: Self-pay

## 2018-06-03 ENCOUNTER — Encounter (INDEPENDENT_AMBULATORY_CARE_PROVIDER_SITE_OTHER): Payer: Self-pay | Admitting: Family Medicine

## 2018-06-03 ENCOUNTER — Ambulatory Visit (INDEPENDENT_AMBULATORY_CARE_PROVIDER_SITE_OTHER): Payer: Self-pay

## 2018-06-03 ENCOUNTER — Encounter (HOSPITAL_BASED_OUTPATIENT_CLINIC_OR_DEPARTMENT_OTHER): Payer: Self-pay | Admitting: *Deleted

## 2018-06-03 DIAGNOSIS — S8261XD Displaced fracture of lateral malleolus of right fibula, subsequent encounter for closed fracture with routine healing: Secondary | ICD-10-CM

## 2018-06-03 NOTE — Progress Notes (Signed)
Office Visit Note   Patient: Brian Huynh           Date of Birth: [birth year]           MRN: [id number] Visit Date: 06/03/2018 Requested by: [facility] [address] [place], [place] [address] PCP: [facility]  Subjective: Chief Complaint  Patient presents with  . Left Ankle - Fracture, Follow-up    In fx boot, TDWB with crutches  2 weeks post injury    HPI: He is here for follow-up left ankle lateral malleolus fracture.  Is been about 1 week since we initiated treatment with a fracture boot and crutches.  It has been 2 weeks since his injury.  He is attempting to elevate his leg in the past few days, but prior to that he has been pretty active even putting some weight on his leg in his boot.  He continues to smoke cigarettes, about 1 pack lasts a day and a half.  He had a stroke with carotid endarterectomy in August.  He is on anticoagulants.              ROS: Noncontributory otherwise.  Objective: Vital Signs: There were no vitals taken for this visit.  Physical Exam:  Left ankle: 1+ swelling in the lateral ankle, very tender to palpation of the malleolus.   Imaging: X-rays left ankle: Slightly shortened, rotated and laterally displaced lateral malleolus fracture.  A little bit more displaced than last week.   Assessment & Plan: 1.  2 weeks status post initial injury with displaced left ankle lateral malleolus fracture - Dr. Erlinda Hong met with him today and patient elected to proceed with ORIF.  This will be scheduled for tomorrow.  Risks and benefits discussed in detail.  Patient understands the importance of compliance with nonweightbearing after surgery.    Follow-Up Instructions: No follow-ups on file.      Procedures: No procedures performed  No notes on file    PMFS History: Patient Active Problem List   Diagnosis Date Noted  . S/P carotid endarterectomy 04/22/2018  . Carotid stenosis 04/22/2018  . Symptomatic  carotid artery stenosis, left 02/16/2018  . Tobacco abuse   . Essential hypertension   . IV drug abuse ([place])   . Thrombocytopenia ([place])   . CVA (cerebral vascular accident) ([place]) 01/26/2018  . Restless legs syndrome 05/13/2013  . Special screening for malignant neoplasms, colon 05/13/2013  . Hepatitis C, acute 03/31/2013  . ETOH abuse 03/29/2013  . Cholecystitis, acute 03/29/2013  . Transaminitis 03/28/2013   Past Medical History:  Diagnosis Date  . Alcohol abuse   . Anxiety   . Cholecystitis   . Chronic back pain    "mostly lower; some in my middle" (01/26/2018)  . CVA (cerebral vascular accident) ([place]) 01/25/2018   bilateral left and right subacute infarcts in the frontal and biparietal area/notes 01/26/2018;  . Daily headache   . Depression   . Hepatitis C   . Hypertension    "not high enough to take RX for it" (01/26/2018)  . Jaundice   . Peripheral vascular disease ([place])    carotid artery stenosis  . Pneumonia    as a child  . Sciatica   . Transaminitis     Family History  Problem Relation Age of Onset  . Stroke Brother        x 2 brothers  . AAA (abdominal aortic aneurysm) Father   . Diabetes Mother   . Lupus Mother  Past Surgical History:  Procedure Laterality Date  . APPENDECTOMY    . BACK SURGERY    . CAROTID ENDARTERECTOMY Left 02/16/2018   PATCH ANGIOPLASTY  . ENDARTERECTOMY Left 02/16/2018   Procedure: Left Carotid ENDARTERECTOMY;  Surgeon: [name], MD;  Location: [facility];  Service: Vascular;  Laterality: Left;  . ENDARTERECTOMY Right 04/22/2018   Procedure: ENDARTERECTOMY CAROTID;  Surgeon: [name], MD;  Location: [facility];  Service: Vascular;  Laterality: Right;  . INGUINAL HERNIA REPAIR Right   . KNEE ARTHROSCOPY Left   . LUMBAR LAMINECTOMY  X 2  . ORCHIOPEXY Right   . PATCH ANGIOPLASTY  02/16/2018   Procedure: PATCH ANGIOPLASTY;  Surgeon: [name], MD;  Location: [facility];  Service: Vascular;;   Social  History   Occupational History  . Not on file  Tobacco Use  . Smoking status: Current Some Day Smoker    Packs/day: 0.25    Years: 38.00    Pack years: 9.50    Types: Cigarettes  . Smokeless tobacco: Former Systems developer    Types: Snuff, Chew  . 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 Alcohol use: Yes    Alcohol/week: 8.0 standard drinks    Types: 8 Cans of beer per week    Comment: 01/26/2018 "2, 12oz beers, 3-4 days/wk"  . Drug use: Not Currently    Comment: 01/26/2018 "nothing for years"  . Sexual activity: Not Currently

## 2018-06-03 NOTE — Pre-Procedure Instructions (Signed)
Discussed pt's history with Dr. Therisa Doyne - is on Plavix due to CVA 01/2018 and is add-on for tomorrow; will be able to do nerve block pre-op, so pt. OK to come for surgery.

## 2018-06-04 ENCOUNTER — Ambulatory Visit (HOSPITAL_BASED_OUTPATIENT_CLINIC_OR_DEPARTMENT_OTHER): Payer: Medicaid Other | Admitting: Anesthesiology

## 2018-06-04 ENCOUNTER — Encounter (HOSPITAL_BASED_OUTPATIENT_CLINIC_OR_DEPARTMENT_OTHER): Payer: Self-pay

## 2018-06-04 ENCOUNTER — Encounter (HOSPITAL_BASED_OUTPATIENT_CLINIC_OR_DEPARTMENT_OTHER)
Admission: RE | Disposition: A | Discharge status: Home / Self Care | Payer: Self-pay | Source: Home / Self Care | Attending: Orthopaedic Surgery

## 2018-06-04 ENCOUNTER — Ambulatory Visit (HOSPITAL_COMMUNITY): Payer: Medicaid Other

## 2018-06-04 ENCOUNTER — Other Ambulatory Visit: Payer: Self-pay

## 2018-06-04 ENCOUNTER — Ambulatory Visit (HOSPITAL_BASED_OUTPATIENT_CLINIC_OR_DEPARTMENT_OTHER)
Admission: RE | Admit: 2018-06-04 | Discharge: 2018-06-04 | Disposition: A | Discharge status: Home / Self Care | Payer: Medicaid Other | Attending: Orthopaedic Surgery | Admitting: Orthopaedic Surgery

## 2018-06-04 DIAGNOSIS — I10 Essential (primary) hypertension: Secondary | ICD-10-CM | POA: Diagnosis not present

## 2018-06-04 DIAGNOSIS — S8262XA Displaced fracture of lateral malleolus of left fibula, initial encounter for closed fracture: Secondary | ICD-10-CM

## 2018-06-04 DIAGNOSIS — X58XXXA Exposure to other specified factors, initial encounter: Secondary | ICD-10-CM | POA: Diagnosis not present

## 2018-06-04 DIAGNOSIS — B1920 Unspecified viral hepatitis C without hepatic coma: Secondary | ICD-10-CM | POA: Diagnosis not present

## 2018-06-04 DIAGNOSIS — Z79899 Other long term (current) drug therapy: Secondary | ICD-10-CM | POA: Diagnosis not present

## 2018-06-04 DIAGNOSIS — F17210 Nicotine dependence, cigarettes, uncomplicated: Secondary | ICD-10-CM | POA: Diagnosis not present

## 2018-06-04 DIAGNOSIS — F329 Major depressive disorder, single episode, unspecified: Secondary | ICD-10-CM | POA: Insufficient documentation

## 2018-06-04 DIAGNOSIS — Z7982 Long term (current) use of aspirin: Secondary | ICD-10-CM | POA: Diagnosis not present

## 2018-06-04 DIAGNOSIS — T148XXA Other injury of unspecified body region, initial encounter: Secondary | ICD-10-CM

## 2018-06-04 DIAGNOSIS — Z8673 Personal history of transient ischemic attack (TIA), and cerebral infarction without residual deficits: Secondary | ICD-10-CM | POA: Insufficient documentation

## 2018-06-04 DIAGNOSIS — F419 Anxiety disorder, unspecified: Secondary | ICD-10-CM | POA: Diagnosis not present

## 2018-06-04 HISTORY — DX: Complete loss of teeth, unspecified cause, unspecified class: Z97.2

## 2018-06-04 HISTORY — DX: Complete loss of teeth, unspecified cause, unspecified class: K08.109

## 2018-06-04 HISTORY — PX: ORIF ANKLE FRACTURE: SHX5408

## 2018-06-04 HISTORY — DX: Displaced fracture of lateral malleolus of unspecified fibula, initial encounter for closed fracture: S82.63XA

## 2018-06-04 LAB — COMPREHENSIVE METABOLIC PANEL
ALT: 20 U/L (ref 0–44)
ANION GAP: 10 (ref 5–15)
AST: 23 U/L (ref 15–41)
Albumin: 3.2 g/dL — ABNORMAL LOW (ref 3.5–5.0)
Alkaline Phosphatase: 72 U/L (ref 38–126)
BILIRUBIN TOTAL: 0.9 mg/dL (ref 0.3–1.2)
BUN: 10 mg/dL (ref 6–20)
CO2: 27 mmol/L (ref 22–32)
Calcium: 9.4 mg/dL (ref 8.9–10.3)
Chloride: 106 mmol/L (ref 98–111)
Creatinine, Ser: 1.13 mg/dL (ref 0.61–1.24)
GFR calc Af Amer: >60 mL/min (ref >60)
GFR calc non Af Amer: >60 mL/min (ref >60)
Glucose, Bld: 89 mg/dL (ref 70–99)
Potassium: 3.9 mmol/L (ref 3.5–5.1)
Sodium: 143 mmol/L (ref 135–145)
Total Protein: 7.3 g/dL (ref 6.5–8.1)

## 2018-06-04 SURGERY — OPEN REDUCTION INTERNAL FIXATION (ORIF) ANKLE FRACTURE
Anesthesia: General | Site: Ankle | Laterality: Left

## 2018-06-04 MED ORDER — LIDOCAINE 2% (20 MG/ML) 5 ML SYRINGE
INTRAMUSCULAR | Status: DC | PRN
  Administered 2018-06-04: 40 mg via INTRAVENOUS

## 2018-06-04 MED ORDER — CALCIUM CARBONATE-VITAMIN D 500-200 MG-UNIT PO TABS: 1.0000 | ORAL_TABLET | Freq: Three times a day (TID) | ORAL | 12 refills | Status: AC

## 2018-06-04 MED ORDER — PROPOFOL 10 MG/ML IV BOLUS
INTRAVENOUS | Status: DC | PRN
  Administered 2018-06-04: 50 mg via INTRAVENOUS
  Administered 2018-06-04: 200 mg via INTRAVENOUS

## 2018-06-04 MED ORDER — ZINC SULFATE 220 (50 ZN) MG PO CAPS: 220.0000 mg | ORAL_CAPSULE | Freq: Every day | ORAL | 0 refills | Status: AC

## 2018-06-04 MED ORDER — ASPIRIN EC 81 MG PO TBEC: 81.0000 mg | DELAYED_RELEASE_TABLET | Freq: Two times a day (BID) | ORAL | 0 refills | Status: DC

## 2018-06-04 MED ORDER — CEFAZOLIN SODIUM-DEXTROSE 2-4 GM/100ML-% IV SOLN
2.0000 g | INTRAVENOUS | Status: AC
  Administered 2018-06-04: 2 g via INTRAVENOUS

## 2018-06-04 MED ORDER — MIDAZOLAM HCL 2 MG/2ML IJ SOLN
INTRAMUSCULAR | Status: AC
  Filled 2018-06-04: qty 2

## 2018-06-04 MED ORDER — METHOCARBAMOL 500 MG PO TABS: 500.0000 mg | ORAL_TABLET | Freq: Four times a day (QID) | ORAL | 2 refills | Status: AC | PRN

## 2018-06-04 MED ORDER — LACTATED RINGERS IV SOLN
INTRAVENOUS | Status: DC
  Administered 2018-06-04: 13:00:00 via INTRAVENOUS

## 2018-06-04 MED ORDER — LACTATED RINGERS IV SOLN
INTRAVENOUS | Status: DC
  Administered 2018-06-04: 11:00:00 via INTRAVENOUS

## 2018-06-04 MED ORDER — CEFAZOLIN SODIUM-DEXTROSE 2-4 GM/100ML-% IV SOLN
INTRAVENOUS | Status: AC
  Filled 2018-06-04: qty 100

## 2018-06-04 MED ORDER — EPHEDRINE SULFATE 50 MG/ML IJ SOLN
INTRAMUSCULAR | Status: DC | PRN
  Administered 2018-06-04: 15 mg via INTRAVENOUS

## 2018-06-04 MED ORDER — SCOPOLAMINE 1 MG/3DAYS TD PT72: 1.0000 | MEDICATED_PATCH | Freq: Once | TRANSDERMAL | Status: DC | PRN

## 2018-06-04 MED ORDER — OXYCODONE HCL ER 10 MG PO T12A: 10.0000 mg | EXTENDED_RELEASE_TABLET | Freq: Two times a day (BID) | ORAL | 0 refills | Status: AC

## 2018-06-04 MED ORDER — OXYCODONE-ACETAMINOPHEN 5-325 MG PO TABS: 1.0000 | ORAL_TABLET | Freq: Three times a day (TID) | ORAL | 0 refills | Status: AC | PRN

## 2018-06-04 MED ORDER — BUPIVACAINE HCL (PF) 0.5 % IJ SOLN
INTRAMUSCULAR | Status: AC
  Filled 2018-06-04: qty 30

## 2018-06-04 MED ORDER — SENNOSIDES-DOCUSATE SODIUM 8.6-50 MG PO TABS: 1.0000 | ORAL_TABLET | Freq: Every evening | ORAL | 1 refills | Status: AC | PRN

## 2018-06-04 MED ORDER — HYDROMORPHONE HCL 1 MG/ML IJ SOLN: 0.2500 mg | INTRAMUSCULAR | Status: DC | PRN

## 2018-06-04 MED ORDER — PROMETHAZINE HCL 25 MG PO TABS: 25.0000 mg | ORAL_TABLET | Freq: Four times a day (QID) | ORAL | 1 refills | Status: AC | PRN

## 2018-06-04 MED ORDER — ASPIRIN EC 81 MG PO TBEC: 81.0000 mg | DELAYED_RELEASE_TABLET | Freq: Two times a day (BID) | ORAL | 0 refills | Status: AC

## 2018-06-04 MED ORDER — FENTANYL CITRATE (PF) 100 MCG/2ML IJ SOLN
INTRAMUSCULAR | Status: AC
  Filled 2018-06-04: qty 2

## 2018-06-04 MED ORDER — MIDAZOLAM HCL 2 MG/2ML IJ SOLN
1.0000 mg | INTRAMUSCULAR | Status: DC | PRN
  Administered 2018-06-04: 1.5 mg via INTRAVENOUS

## 2018-06-04 MED ORDER — ONDANSETRON HCL 4 MG/2ML IJ SOLN
INTRAMUSCULAR | Status: DC | PRN
  Administered 2018-06-04: 4 mg via INTRAVENOUS

## 2018-06-04 MED ORDER — CHLORHEXIDINE GLUCONATE 4 % EX LIQD: 60.0000 mL | Freq: Once | CUTANEOUS | Status: DC

## 2018-06-04 MED ORDER — FENTANYL CITRATE (PF) 100 MCG/2ML IJ SOLN
50.0000 ug | INTRAMUSCULAR | Status: DC | PRN
  Administered 2018-06-04: 75 ug via INTRAVENOUS

## 2018-06-04 MED ORDER — LIDOCAINE HCL (PF) 1 % IJ SOLN
INTRAMUSCULAR | Status: AC
  Filled 2018-06-04: qty 30

## 2018-06-04 MED ORDER — DEXAMETHASONE SODIUM PHOSPHATE 4 MG/ML IJ SOLN
INTRAMUSCULAR | Status: DC | PRN
  Administered 2018-06-04: 10 mg via INTRAVENOUS

## 2018-06-04 SURGICAL SUPPLY — 90 items
BANDAGE ACE 4X5 VEL STRL LF (GAUZE/BANDAGES/DRESSINGS) ×2 IMPLANT
BANDAGE ACE 6X5 VEL STRL LF (GAUZE/BANDAGES/DRESSINGS) ×3 IMPLANT
BANDAGE ESMARK 6X9 LF (GAUZE/BANDAGES/DRESSINGS) ×1 IMPLANT
BIT DRILL 2.5X2.75 QC CALB (BIT) ×2 IMPLANT
BIT DRILL CALIBRATED 2.7 (BIT) ×1 IMPLANT
BIT DRILL CALIBRATED 2.7MM (BIT) ×1
BLADE HEX COATED 2.75 (ELECTRODE) ×3 IMPLANT
BLADE SURG 15 STRL LF DISP TIS (BLADE) ×2 IMPLANT
BLADE SURG 15 STRL SS (BLADE) ×6
BNDG CMPR 9X6 STRL LF SNTH (GAUZE/BANDAGES/DRESSINGS) ×1
BNDG COHESIVE 6X5 TAN STRL LF (GAUZE/BANDAGES/DRESSINGS) ×3 IMPLANT
BNDG ESMARK 6X9 LF (GAUZE/BANDAGES/DRESSINGS) ×3
BRUSH SCRUB EZ PLAIN DRY (MISCELLANEOUS) ×3 IMPLANT
CANISTER SUCT 1200ML W/VALVE (MISCELLANEOUS) ×3 IMPLANT
COVER BACK TABLE 60X90IN (DRAPES) ×3 IMPLANT
COVER MAYO STAND STRL (DRAPES) ×2 IMPLANT
COVER WAND RF STERILE (DRAPES) IMPLANT
CUFF TOURNIQUET SINGLE 34IN LL (TOURNIQUET CUFF) ×2 IMPLANT
DECANTER SPIKE VIAL GLASS SM (MISCELLANEOUS) IMPLANT
DRAPE C-ARM 42X72 X-RAY (DRAPES) ×3 IMPLANT
DRAPE C-ARMOR (DRAPES) ×3 IMPLANT
DRAPE EXTREMITY T 121X128X90 (DRAPE) ×3 IMPLANT
DRAPE IMP U-DRAPE 54X76 (DRAPES) ×3 IMPLANT
DRAPE SURG 17X23 STRL (DRAPES) ×6 IMPLANT
DRSG PAD ABDOMINAL 8X10 ST (GAUZE/BANDAGES/DRESSINGS) ×6 IMPLANT
DURAPREP 26ML APPLICATOR (WOUND CARE) ×4 IMPLANT
ELECT REM PT RETURN 9FT ADLT (ELECTROSURGICAL) ×3
ELECTRODE REM PT RTRN 9FT ADLT (ELECTROSURGICAL) ×1 IMPLANT
GAUZE SPONGE 4X4 12PLY STRL (GAUZE/BANDAGES/DRESSINGS) ×3 IMPLANT
GAUZE XEROFORM 1X8 LF (GAUZE/BANDAGES/DRESSINGS) ×3 IMPLANT
GLOVE BIOGEL PI IND STRL 7.0 (GLOVE) ×1 IMPLANT
GLOVE BIOGEL PI INDICATOR 7.0 (GLOVE) ×2
GLOVE ECLIPSE 7.0 STRL STRAW (GLOVE) ×3 IMPLANT
GLOVE SKINSENSE NS SZ7.5 (GLOVE) ×2
GLOVE SKINSENSE STRL SZ7.5 (GLOVE) ×1 IMPLANT
GLOVE SURG SYN 7.5  E (GLOVE) ×2
GLOVE SURG SYN 7.5 E (GLOVE) ×1 IMPLANT
GLOVE SURG SYN 7.5 PF PI (GLOVE) ×1 IMPLANT
GOWN STRL REIN XL XLG (GOWN DISPOSABLE) ×3 IMPLANT
GOWN STRL REUS W/ TWL LRG LVL3 (GOWN DISPOSABLE) ×1 IMPLANT
GOWN STRL REUS W/ TWL XL LVL3 (GOWN DISPOSABLE) ×1 IMPLANT
GOWN STRL REUS W/TWL LRG LVL3 (GOWN DISPOSABLE) ×3
GOWN STRL REUS W/TWL XL LVL3 (GOWN DISPOSABLE) ×3
K-WIRE ACE 1.6X6 (WIRE) ×6
KWIRE ACE 1.6X6 (WIRE) IMPLANT
MANIFOLD NEPTUNE II (INSTRUMENTS) ×3 IMPLANT
NEEDLE HYPO 22GX1.5 SAFETY (NEEDLE) IMPLANT
NS IRRIG 1000ML POUR BTL (IV SOLUTION) ×7 IMPLANT
PACK BASIN DAY SURGERY FS (CUSTOM PROCEDURE TRAY) ×3 IMPLANT
PAD CAST 3X4 CTTN HI CHSV (CAST SUPPLIES) IMPLANT
PAD CAST 4YDX4 CTTN HI CHSV (CAST SUPPLIES) IMPLANT
PADDING CAST COTTON 3X4 STRL (CAST SUPPLIES)
PADDING CAST COTTON 4X4 STRL (CAST SUPPLIES) ×12
PADDING CAST COTTON 6X4 STRL (CAST SUPPLIES) IMPLANT
PADDING CAST SYN 6 (CAST SUPPLIES)
PADDING CAST SYNTHETIC 4 (CAST SUPPLIES)
PADDING CAST SYNTHETIC 4X4 STR (CAST SUPPLIES) ×1 IMPLANT
PADDING CAST SYNTHETIC 6X4 NS (CAST SUPPLIES) ×1 IMPLANT
PENCIL BUTTON HOLSTER BLD 10FT (ELECTRODE) ×3 IMPLANT
PLATE LOCK 3H 95 LT DIST FIB (Plate) ×2 IMPLANT
SCREW CORT 3.5X26 (Screw) ×3 IMPLANT
SCREW CORT T15 26X3.5XST LCK (Screw) IMPLANT
SCREW CORTICAL LOW PROF 3.5X20 (Screw) ×2 IMPLANT
SCREW LOCK CORT STAR 3.5X12 (Screw) ×2 IMPLANT
SCREW LOCK CORT STAR 3.5X14 (Screw) ×4 IMPLANT
SCREW LOCK CORT STAR 3.5X18 (Screw) ×2 IMPLANT
SCREW LOW PROFILE 18MMX3.5MM (Screw) ×2 IMPLANT
SCREW NON LOCKING LP 3.5 16MM (Screw) ×4 IMPLANT
SHEET MEDIUM DRAPE 40X70 STRL (DRAPES) ×3 IMPLANT
SLEEVE SCD COMPRESS KNEE MED (MISCELLANEOUS) ×3 IMPLANT
SPLINT FIBERGLASS 4X30 (CAST SUPPLIES) ×4 IMPLANT
SPONGE LAP 18X18 RF (DISPOSABLE) ×3 IMPLANT
SUCTION FRAZIER HANDLE 10FR (MISCELLANEOUS) ×2
SUCTION TUBE FRAZIER 10FR DISP (MISCELLANEOUS) ×1 IMPLANT
SUT ETHILON 3 0 PS 1 (SUTURE) ×4 IMPLANT
SUT VIC AB 0 CT1 27 (SUTURE) ×3
SUT VIC AB 0 CT1 27XBRD ANBCTR (SUTURE) IMPLANT
SUT VIC AB 2-0 CT1 27 (SUTURE) ×3
SUT VIC AB 2-0 CT1 TAPERPNT 27 (SUTURE) ×1 IMPLANT
SUT VIC AB 3-0 SH 27 (SUTURE)
SUT VIC AB 3-0 SH 27X BRD (SUTURE) IMPLANT
SYR BULB 3OZ (MISCELLANEOUS) ×3 IMPLANT
SYR CONTROL 10ML LL (SYRINGE) IMPLANT
TOWEL GREEN STERILE FF (TOWEL DISPOSABLE) ×3 IMPLANT
TOWEL OR NON WOVEN STRL DISP B (DISPOSABLE) ×1 IMPLANT
TRAY DSU PREP LF (CUSTOM PROCEDURE TRAY) ×3 IMPLANT
TUBE CONNECTING 20'X1/4 (TUBING) ×1
TUBE CONNECTING 20X1/4 (TUBING) ×2 IMPLANT
UNDERPAD 30X30 (UNDERPADS AND DIAPERS) ×3 IMPLANT
YANKAUER SUCT BULB TIP NO VENT (SUCTIONS) ×3 IMPLANT

## 2018-06-04 NOTE — H&P (Signed)
PREOPERATIVE H&P  Chief Complaint: left lateral malleolus fracture  HPI: Brian Huynh is a 57 y.o. male who presents for surgical treatment of left lateral malleolus fracture.  He denies any changes in medical history.  Past Medical History:  Diagnosis Date  . Anxiety   . Chronic back pain    lumbar  . CVA (cerebral vascular accident) ([place]) 01/25/2018   states mild right-sided weakness; walked with a cane prior to ankle fx.  . Daily headache   . Depression   . Full dentures    states does not wear them  . Hepatitis C   . Hypertension    states under control with med., has been on med. since 01/2018  . Lateral malleolar fracture 05/2018   left  . Sciatica    bilateral   Past Surgical History:  Procedure Laterality Date  . APPENDECTOMY    . ENDARTERECTOMY Left 02/16/2018   Procedure: Left Carotid ENDARTERECTOMY;  Surgeon: [name], MD;  Location: [facility];  Service: Vascular;  Laterality: Left;  . ENDARTERECTOMY Right 04/22/2018   Procedure: ENDARTERECTOMY CAROTID;  Surgeon: [name], MD;  Location: [facility];  Service: Vascular;  Laterality: Right;  . INGUINAL HERNIA REPAIR Right   . KNEE ARTHROSCOPY Left   . LUMBAR LAMINECTOMY  X 2  . ORCHIOPEXY Right   . PATCH ANGIOPLASTY  02/16/2018   Procedure: PATCH ANGIOPLASTY;  Surgeon: [name], MD;  Location: [facility];  Service: Vascular;;   Social History   Socioeconomic History  . Marital status: Divorced    Spouse name: Not on file  . Number of children: 2  . Years of education: Not on file  . Highest education level: Not on file  Occupational History  . Not on file  Social Needs  . Financial resource strain: Not on file  . Food insecurity:    Worry: Not on file    Inability: Not on file  . Transportation needs:    Medical: Not on file    Non-medical: Not on file  Tobacco Use  . Smoking status: Current Some Day Smoker    Packs/day: 0.50    Years: 38.00    Pack  years: 19.00    Types: Cigarettes  . Smokeless tobacco: Former Systems developer    Types: Snuff, Chew  Substance and Sexual Activity  . Alcohol use: Yes    Comment: occasionally  . Drug use: Not Currently  . Sexual activity: Not Currently  Lifestyle  . Physical activity:    Days per week: Not on file    Minutes per session: Not on file  . Stress: Not on file  Relationships  . Social connections:    Talks on phone: Not on file    Gets together: Not on file    Attends religious service: Not on file    Active member of club or organization: Not on file    Attends meetings of clubs or organizations: Not on file    Relationship status: Not on file  Other Topics Concern  . Not on file  Social History Narrative  . Not on file   Family History  Problem Relation Age of Onset  . Stroke Brother        x 2 brothers  . AAA (abdominal aortic aneurysm) Father   . Diabetes Mother   . Lupus Mother    Allergies  Allergen Reactions  . Protamine Other (See Comments)    HYPOTENSION  . Voltaren [Diclofenac]  Rash   Prior to Admission medications   Medication Sig Start Date End Date Taking? Authorizing Provider  amLODipine (NORVASC) 10 MG tablet Take 10 mg by mouth daily.   Yes [provider]  aspirin 325 MG tablet Take 325 mg by mouth daily.   Yes [provider]  atorvastatin (LIPITOR) 40 MG tablet Take 40 mg by mouth daily.   Yes [provider]  citalopram (CELEXA) 40 MG tablet Take 40 mg by mouth daily.   Yes [provider]  clopidogrel (PLAVIX) 75 MG tablet Take 1 tablet (75 mg total) by mouth daily. 04/09/18  Yes [name], MD  gabapentin (NEURONTIN) 100 MG capsule Take 2 capsules (200 mg total) by mouth at bedtime. 02/03/18 06/03/18 Yes Donne Hazel, MD  HYDROcodone-acetaminophen (NORCO/VICODIN) 5-325 MG tablet Take 1 tablet by mouth every 6 (six) hours as needed for moderate pain. 05/28/18  Yes Hilts, Legrand Como, MD  traZODone (DESYREL) 50 MG  tablet Take 100 mg by mouth at bedtime.    Yes [provider]     Positive ROS: All other systems have been reviewed and were otherwise negative with the exception of those mentioned in the HPI and as above.  Physical Exam: General: Alert, no acute distress Cardiovascular: No pedal edema Respiratory: No cyanosis, no use of accessory musculature GI: abdomen soft Skin: No lesions in the area of chief complaint Neurologic: Sensation intact distally Psychiatric: Patient is competent for consent with normal mood and affect Lymphatic: no lymphedema  MUSCULOSKELETAL: exam stable  Assessment: left lateral malleolus fracture  Plan: Plan for Procedure(s): OPEN REDUCTION INTERNAL FIXATION (ORIF) LEFT ANKLE FRACTURE  The risks benefits and alternatives were discussed with the patient including but not limited to the risks of nonoperative treatment, versus surgical intervention including infection, bleeding, nerve injury,  blood clots, cardiopulmonary complications, morbidity, mortality, among others, and they were willing to proceed.   Eduard Roux, MD   06/04/2018 12:22 PM

## 2018-06-04 NOTE — Anesthesia Procedure Notes (Signed)
Procedure Name: LMA Insertion Performed by: Margree Gimbel W, CRNA Pre-anesthesia Checklist: Patient identified, Emergency Drugs available, Suction available and Patient being monitored Patient Re-evaluated:Patient Re-evaluated prior to induction Oxygen Delivery Method: Circle system utilized Preoxygenation: Pre-oxygenation with 100% oxygen Induction Type: IV induction Ventilation: Mask ventilation without difficulty LMA: LMA inserted LMA Size: 4.0 Number of attempts: 1 Placement Confirmation: positive ETCO2 Tube secured with: Tape Dental Injury: Teeth and Oropharynx as per pre-operative assessment        

## 2018-06-04 NOTE — Progress Notes (Signed)
Assisted Dr. Edwards with left, ultrasound guided, popliteal block. Side rails up, monitors on throughout procedure. See vital signs in flow sheet. Tolerated Procedure well. 

## 2018-06-04 NOTE — Anesthesia Preprocedure Evaluation (Signed)
Anesthesia Evaluation  Patient identified by MRN, date of birth, ID band Patient awake    Reviewed: Allergy & Precautions, NPO status , Patient's Chart, lab work & pertinent test results  Airway Mallampati: II  TM Distance: >3 FB     Dental   Pulmonary Current Smoker,    breath sounds clear to auscultation       Cardiovascular hypertension,  Rhythm:Regular Rate:Normal     Neuro/Psych  Headaches,    GI/Hepatic negative GI ROS, (+) Hepatitis -  Endo/Other  negative endocrine ROS  Renal/GU negative Renal ROS     Musculoskeletal   Abdominal   Peds  Hematology   Anesthesia Other Findings   Reproductive/Obstetrics                             Anesthesia Physical Anesthesia Plan  ASA: III  Anesthesia Plan: General   Post-op Pain Management:  Regional for Post-op pain   Induction: Intravenous  PONV Risk Score and Plan: 2 and Ondansetron, Midazolam and Treatment may vary due to age or medical condition  Airway Management Planned: LMA  Additional Equipment:   Intra-op Plan:   Post-operative Plan: Extubation in OR  Informed Consent: I have reviewed the patients History and Physical, chart, labs and discussed the procedure including the risks, benefits and alternatives for the proposed anesthesia with the patient or authorized representative who has indicated his/her understanding and acceptance.   Dental advisory given  Plan Discussed with: CRNA, Anesthesiologist and Surgeon  Anesthesia Plan Comments:         Anesthesia Quick Evaluation

## 2018-06-04 NOTE — Discharge Instructions (Signed)
° ° °  1. Keep splint clean and dry 2. Elevate foot above level of the heart 3. Take aspirin to prevent blood clots 4. Take pain meds as needed 5. Strict non weight bearing to operative extremity Regional Anesthesia Blocks  1. Numbness or the inability to move the "blocked" extremity may last from 3-48 hours after placement. The length of time depends on the medication injected and your individual response to the medication. If the numbness is not going away after 48 hours, call your surgeon.  2. The extremity that is blocked will need to be protected until the numbness is gone and the  Strength has returned. Because you cannot feel it, you will need to take extra care to avoid injury. Because it may be weak, you may have difficulty moving it or using it. You may not know what position it is in without looking at it while the block is in effect.  3. For blocks in the legs and feet, returning to weight bearing and walking needs to be done carefully. You will need to wait until the numbness is entirely gone and the strength has returned. You should be able to move your leg and foot normally before you try and bear weight or walk. You will need someone to be with you when you first try to ensure you do not fall and possibly risk injury.  4. Bruising and tenderness at the needle site are common side effects and will resolve in a few days.  5. Persistent numbness or new problems with movement should be communicated to the surgeon or the [facility] ([phone])/ [facility] ([phone]).   Post Anesthesia Home Care Instructions  Activity: Get plenty of rest for the remainder of the day. A responsible individual must stay with you for 24 hours following the procedure.  For the next 24 hours, DO NOT: -Drive a car -Operate machinery -Drink alcoholic beverages -Take any medication unless instructed by your physician -Make any legal decisions or sign important  papers.  Meals: Start with liquid foods such as gelatin or soup. Progress to regular foods as tolerated. Avoid greasy, spicy, heavy foods. If nausea and/or vomiting occur, drink only clear liquids until the nausea and/or vomiting subsides. Call your physician if vomiting continues.  Special Instructions/Symptoms: Your throat may feel dry or sore from the anesthesia or the breathing tube placed in your throat during surgery. If this causes discomfort, gargle with warm salt water. The discomfort should disappear within 24 hours.  If you had a scopolamine patch placed behind your ear for the management of post- operative nausea and/or vomiting:  1. The medication in the patch is effective for 72 hours, after which it should be removed.  Wrap patch in a tissue and discard in the trash. Wash hands thoroughly with soap and water. 2. You may remove the patch earlier than 72 hours if you experience unpleasant side effects which may include dry mouth, dizziness or visual disturbances. 3. Avoid touching the patch. Wash your hands with soap and water after contact with the patch.     

## 2018-06-04 NOTE — Anesthesia Procedure Notes (Addendum)
Anesthesia Regional Block: Popliteal block   Pre-Anesthetic Checklist: ,, timeout performed, Correct Patient, Correct Site, Correct Laterality, Correct Procedure, Correct Position, site marked, Risks and benefits discussed,  Surgical consent,  Pre-op evaluation,  At surgeon's request and post-op pain management  Laterality: Left  Prep: chloraprep       Needles:   Needle Type: Stimulator Needle - 80          Additional Needles:   Procedures: Doppler guided,,,, ultrasound used (permanent image in chart),,,,  Narrative:  Start time: 06/04/2018 12:05 PM End time: 06/04/2018 12:25 PM Injection made incrementally with aspirations every 5 mL.  Performed by: Personally  Anesthesiologist: Belinda Block, MD

## 2018-06-04 NOTE — Transfer of Care (Signed)
Immediate Anesthesia Transfer of Care Note  Patient: Brian Huynh  Procedure(s) Performed: OPEN REDUCTION INTERNAL FIXATION (ORIF) LEFT ANKLE FRACTURE (Left Ankle)  Patient Location: PACU  Anesthesia Type:General and Regional  Level of Consciousness: awake and sedated  Airway & Oxygen Therapy: Patient Spontanous Breathing and Patient connected to face mask oxygen  Post-op Assessment: Report given to RN and Post -op Vital signs reviewed and stable  Post vital signs: Reviewed and stable  Last Vitals:  Vitals Value Taken Time  BP 139/91 06/04/2018  2:02 PM  Temp    Pulse 88 06/04/2018  2:03 PM  Resp 17 06/04/2018  2:03 PM  SpO2 99 % 06/04/2018  2:03 PM  Vitals shown include unvalidated device data.  Last Pain:  Vitals:   06/04/18 1042  TempSrc: Oral  PainSc: 6       Patients Stated Pain Goal: 4 (01/77/93 9030)  Complications: No apparent anesthesia complications

## 2018-06-04 NOTE — Op Note (Signed)
Date of Surgery: 06/04/2018  INDICATIONS: [name] is a 57 y.o.-year-old male who sustained a left ankle fracture; he was indicated for open reduction and internal fixation due to the displaced nature of the articular fracture and came to the operating room today for this procedure. The patient did consent to the procedure after discussion of the risks and benefits.  PREOPERATIVE DIAGNOSIS: left lateral malleolus ankle fracture  POSTOPERATIVE DIAGNOSIS: Same.  PROCEDURE: Open treatment of left ankle fracture with internal fixation. Lateral malleolar CPT [phone]  SURGEON: N. Eduard Roux, M.D.  ASSIST: Ciro Backer [place], [place]; necessary for the timely completion of procedure and due to complexity of procedure.  ANESTHESIA:  general, regional  TOURNIQUET TIME: less than 60 mins  IV FLUIDS AND URINE: See anesthesia.  ESTIMATED BLOOD LOSS: minimal mL.  IMPLANTS: Biomet  COMPLICATIONS: None.  DESCRIPTION OF PROCEDURE: The patient was brought to the operating room and placed supine on the operating table.  The patient had been signed prior to the procedure and this was documented. The patient had the anesthesia placed by the anesthesiologist.  A nonsterile tourniquet was placed on the upper thigh.  The prep verification and incision time-outs were performed to confirm that this was the correct patient, site, side and location. The patient had an SCD on the opposite lower extremity. The patient did receive antibiotics prior to the incision and was re-dosed during the procedure as needed at indicated intervals.  The patient had the lower extremity prepped and draped in the standard surgical fashion.  The extremity was exsanguinated using an esmarch bandage and the tourniquet was inflated to 300 mm Hg.  An incision was made on the lateral aspect of the distal fibula.  Full-thickness flaps were elevated off of the fibula.  Retractors were placed.  The fracture line was visualized.  The  fracture was then booked open and organized hematoma immature callus was removed.  After this was done reduction was then performed and achieved.  Of note the bone quality was poor.  I felt given the orientation of the fracture and the quality of the bone I was not able to place a lag screw.  I then placed a precontoured anatomic plate on the lateral aspect of the distal fibula at the appropriate position using fluoroscopic guidance.  This was then provisionally fixed to the bone using K wires.  After confirmation of appropriate placement of the hardware I then placed 3 nonlocking screws through the proximal portion of the plate proximal to the fracture to contour the plate down to the bone.  Each screw had excellent fixation.  I then turned my attention distally.  I placed 2 nonlocking screws through the distal portion of the plate in order to contour the plate down to the distal fibula.  The fracture remained reduced.  I then placed 2 more unicortical locking screws through the distal portion of the plate.  The nonlocking screws in the distal portion plate was then removed and exchanged for shorter locking screws.  Final x-rays were taken.  Stress exam showed no widening of the medial clear space.  The wound was then thoroughly irrigated closed in layered fashion using 0 Vicryl, 2-0 Vicryl, 3-0 nylon.  Sterile dressings were applied.  Short leg splint was placed.  Patient tolerated procedure well had no immediate complications.  POSTOPERATIVE PLAN: [name] will remain nonweightbearing on this leg for approximately 6 weeks; [name] will return for suture removal in 2 weeks.  He will be immobilized  in a short leg splint and then transitioned to a CAM walker at his first follow up appointment.  [name] will receive DVT prophylaxis based on other medications, activity level, and risk ratio of bleeding to thrombosis.  Azucena Cecil, MD [place] 1:39 PM

## 2018-06-04 NOTE — Anesthesia Postprocedure Evaluation (Signed)
Anesthesia Post Note  Patient: Brian Huynh  Procedure(s) Performed: OPEN REDUCTION INTERNAL FIXATION (ORIF) LEFT ANKLE FRACTURE (Left Ankle)     Patient location during evaluation: PACU Anesthesia Type: General Pain management: pain level controlled Vital Signs Assessment: post-procedure vital signs reviewed and stable Respiratory status: spontaneous breathing Cardiovascular status: stable Postop Assessment: no apparent nausea or vomiting Anesthetic complications: no    Last Vitals:  Vitals:   06/04/18 1430 06/04/18 1454  BP: 137/81 (!) 148/88  Pulse: 81 79  Resp: (!) 21 16  Temp:  36.7 C  SpO2: 96% 98%    Last Pain:  Vitals:   06/04/18 1454  TempSrc:   PainSc: 0-No pain                 Aadil Sur

## 2018-06-07 ENCOUNTER — Encounter (HOSPITAL_BASED_OUTPATIENT_CLINIC_OR_DEPARTMENT_OTHER): Payer: Self-pay | Admitting: Orthopaedic Surgery

## 2018-06-17 ENCOUNTER — Ambulatory Visit (INDEPENDENT_AMBULATORY_CARE_PROVIDER_SITE_OTHER): Payer: Self-pay

## 2018-06-17 ENCOUNTER — Encounter (INDEPENDENT_AMBULATORY_CARE_PROVIDER_SITE_OTHER): Payer: Self-pay | Admitting: Orthopaedic Surgery

## 2018-06-17 ENCOUNTER — Ambulatory Visit (INDEPENDENT_AMBULATORY_CARE_PROVIDER_SITE_OTHER): Payer: Non-veteran care | Admitting: Orthopaedic Surgery

## 2018-06-17 DIAGNOSIS — S8261XD Displaced fracture of lateral malleolus of right fibula, subsequent encounter for closed fracture with routine healing: Secondary | ICD-10-CM

## 2018-06-17 DIAGNOSIS — M25572 Pain in left ankle and joints of left foot: Secondary | ICD-10-CM

## 2018-06-17 MED ORDER — HYDROCODONE-ACETAMINOPHEN 5-325 MG PO TABS: 1.0000 | ORAL_TABLET | Freq: Every evening | ORAL | 0 refills | Status: AC | PRN

## 2018-06-17 NOTE — Progress Notes (Signed)
Post-Op Visit Note   Patient: Brian Huynh           Date of Birth: 1960-10-18           MRN: [id number] Visit Date: 06/17/2018 PCP: [facility], [place]:  Chief Complaint:  Chief Complaint  Patient presents with  . Left Ankle - Routine Post Op   Visit Diagnoses:  1. Closed displaced fracture of lateral malleolus of right fibula with routine healing, subsequent encounter   2. Left ankle pain, unspecified chronicity     Plan: Brian Huynh is two-week status post left distal fibula ORIF.  He is doing well overall.  He mainly complains of throbbing pain at night.  He denies any constitutional symptoms.  Incision is healed without signs of infection.  Today we remove the sutures and placed Steri-Strips.  We placed him in a cam boot.  He needs to continue with nonweightbearing with crutches.  He is to begin gentle ankle range of motion on his own.  Recheck in 4 weeks with three-view x-rays of the left ankle.  I refilled a small supply of hydrocodone to take at night.  I did recommend taking Advil for the discomfort first.  Follow-Up Instructions: Return in about 4 weeks (around 07/15/2018).   Orders:  Orders Placed This Encounter  Procedures  . XR Ankle Complete Left   Meds ordered this encounter  Medications  . HYDROcodone-acetaminophen (NORCO) 5-325 MG tablet    Sig: Take 1 tablet by mouth at bedtime as needed.    Dispense:  10 tablet    Refill:  0    Imaging: Xr Ankle Complete Left  Result Date: 06/17/2018 Stable fixation and alignment of lateral malleolus fracture.   PMFS History: Patient Active Problem List   Diagnosis Date Noted  . Closed fracture of left lateral malleolus 06/04/2018  . S/P carotid endarterectomy 04/22/2018  . Carotid stenosis 04/22/2018  . Symptomatic carotid artery stenosis, left 02/16/2018  . Tobacco abuse   . Essential hypertension   . IV drug abuse ([place])   . Thrombocytopenia ([place])   . CVA (cerebral vascular accident)  ([place]) 01/26/2018  . Restless legs syndrome 05/13/2013  . Special screening for malignant neoplasms, colon 05/13/2013  . Hepatitis C, acute 03/31/2013  . ETOH abuse 03/29/2013  . Cholecystitis, acute 03/29/2013  . Transaminitis 03/28/2013   Past Medical History:  Diagnosis Date  . Anxiety   . Chronic back pain    lumbar  . CVA (cerebral vascular accident) ([place]) 01/25/2018   states mild right-sided weakness; walked with a cane prior to ankle fx.  . Daily headache   . Depression   . Full dentures    states does not wear them  . Hepatitis C   . Hypertension    states under control with med., has been on med. since 01/2018  . Lateral malleolar fracture 05/2018   left  . Sciatica    bilateral    Family History  Problem Relation Age of Onset  . Stroke Brother        x 2 brothers  . AAA (abdominal aortic aneurysm) Father   . Diabetes Mother   . Lupus Mother     Past Surgical History:  Procedure Laterality Date  . APPENDECTOMY    . ENDARTERECTOMY Left 02/16/2018   Procedure: Left Carotid ENDARTERECTOMY;  Surgeon: [name], MD;  Location: [facility];  Service: Vascular;  Laterality: Left;  . ENDARTERECTOMY Right 04/22/2018   Procedure: ENDARTERECTOMY  CAROTID;  Surgeon: [name], MD;  Location: [facility];  Service: Vascular;  Laterality: Right;  . INGUINAL HERNIA REPAIR Right   . KNEE ARTHROSCOPY Left   . LUMBAR LAMINECTOMY  X 2  . ORCHIOPEXY Right   . ORIF ANKLE FRACTURE Left 06/04/2018   Procedure: OPEN REDUCTION INTERNAL FIXATION (ORIF) LEFT ANKLE FRACTURE;  Surgeon: Leandrew Koyanagi, MD;  Location: [facility];  Service: Orthopedics;  Laterality: Left;  . PATCH ANGIOPLASTY  02/16/2018   Procedure: PATCH ANGIOPLASTY;  Surgeon: [name], MD;  Location: [facility];  Service: Vascular;;   Social History   Occupational History  . Not on file  Tobacco Use  . Smoking status: Current Some Day Smoker    Packs/day: 0.50    Years:  38.00    Pack years: 19.00    Types: Cigarettes  . Smokeless tobacco: Former Systems developer    Types: Snuff, Chew  Substance and Sexual Activity  . Alcohol use: Yes    Comment: occasionally  . Drug use: Not Currently  . Sexual activity: Not Currently

## 2018-07-15 ENCOUNTER — Ambulatory Visit (INDEPENDENT_AMBULATORY_CARE_PROVIDER_SITE_OTHER): Payer: Non-veteran care | Admitting: Orthopaedic Surgery

## 2018-07-20 ENCOUNTER — Encounter (INDEPENDENT_AMBULATORY_CARE_PROVIDER_SITE_OTHER): Payer: Self-pay | Admitting: Orthopaedic Surgery

## 2018-07-20 ENCOUNTER — Ambulatory Visit (INDEPENDENT_AMBULATORY_CARE_PROVIDER_SITE_OTHER): Payer: Self-pay

## 2018-07-20 ENCOUNTER — Ambulatory Visit (INDEPENDENT_AMBULATORY_CARE_PROVIDER_SITE_OTHER): Payer: Non-veteran care | Admitting: Physician Assistant

## 2018-07-20 DIAGNOSIS — S8262XA Displaced fracture of lateral malleolus of left fibula, initial encounter for closed fracture: Secondary | ICD-10-CM

## 2018-07-20 NOTE — Progress Notes (Signed)
Post-Op Visit Note   Patient: Brian Huynh           Date of Birth: December 02, 1960           MRN: [id number] Visit Date: 07/20/2018 PCP: Clinic, [place]:  Chief Complaint:  Chief Complaint  Patient presents with  . Left Ankle - Fracture   Visit Diagnoses:  1. Closed displaced fracture of lateral malleolus of left fibula, initial encounter     Plan: Patient is a pleasant 58 year old gentleman who presents to our clinic today 46 days status post ORIF left ankle fracture, date of surgery 06/04/2018.  He has been nonweightbearing in his cam walker up until this past weekend where he has started to ambulate in the cam walker.  He has had increased pain since.  No new injury.  Overall, he is however better.  Examination of the left ankle reveals well-healed surgical incision without evidence of infection.  Moderate swelling.  Calf is soft and nontender.  He does have moderate tenderness over the fracture site.  Limited range of motion of the left ankle.  At this point, we will allow him to be full weightbearing in his cam walker.  We will start him in physical therapy.  A prescription was provided today.  Follow-up with [place] in 6 weeks time for repeat evaluation and 3 view x-rays left ankle  Follow-Up Instructions: Return in about 6 weeks (around 08/31/2018).   Orders:  Orders Placed This Encounter  Procedures  . XR Ankle Complete Left   No orders of the defined types were placed in this encounter.   Imaging: Xr Ankle Complete Left  Result Date: 07/20/2018 X-rays demonstrate stable alignment of the fracture with bony consolidation and callus formation   PMFS History: Patient Active Problem List   Diagnosis Date Noted  . Closed fracture of left lateral malleolus 06/04/2018  . S/P carotid endarterectomy 04/22/2018  . Carotid stenosis 04/22/2018  . Symptomatic carotid artery stenosis, left 02/16/2018  . Tobacco abuse   . Essential hypertension   . IV  drug abuse ([place])   . Thrombocytopenia ([place])   . CVA (cerebral vascular accident) ([place]) 01/26/2018  . Restless legs syndrome 05/13/2013  . Special screening for malignant neoplasms, colon 05/13/2013  . Hepatitis C, acute 03/31/2013  . ETOH abuse 03/29/2013  . Cholecystitis, acute 03/29/2013  . Transaminitis 03/28/2013   Past Medical History:  Diagnosis Date  . Anxiety   . Chronic back pain    lumbar  . CVA (cerebral vascular accident) ([place]) 01/25/2018   states mild right-sided weakness; walked with a cane prior to ankle fx.  . Daily headache   . Depression   . Full dentures    states does not wear them  . Hepatitis C   . Hypertension    states under control with med., has been on med. since 01/2018  . Lateral malleolar fracture 05/2018   left  . Sciatica    bilateral    Family History  Problem Relation Age of Onset  . Stroke Brother        x 2 brothers  . AAA (abdominal aortic aneurysm) Father   . Diabetes Mother   . Lupus Mother     Past Surgical History:  Procedure Laterality Date  . APPENDECTOMY    . ENDARTERECTOMY Left 02/16/2018   Procedure: Left Carotid ENDARTERECTOMY;  Surgeon: [name], MD;  Location: [place];  Service: Vascular;  Laterality: Left;  . ENDARTERECTOMY  Right 04/22/2018   Procedure: ENDARTERECTOMY CAROTID;  Surgeon: [name], MD;  Location: [place];  Service: Vascular;  Laterality: Right;  . INGUINAL HERNIA REPAIR Right   . KNEE ARTHROSCOPY Left   . LUMBAR LAMINECTOMY  X 2  . ORCHIOPEXY Right   . ORIF ANKLE FRACTURE Left 06/04/2018   Procedure: OPEN REDUCTION INTERNAL FIXATION (ORIF) LEFT ANKLE FRACTURE;  Surgeon: Leandrew Koyanagi, MD;  Location: [place];  Service: Orthopedics;  Laterality: Left;  . PATCH ANGIOPLASTY  02/16/2018   Procedure: PATCH ANGIOPLASTY;  Surgeon: [name], MD;  Location: [place];  Service: Vascular;;   Social History   Occupational History  . Not on file    Tobacco Use  . Smoking status: Current Some Day Smoker    Packs/day: 0.50    Years: 38.00    Pack years: 19.00    Types: Cigarettes  . Smokeless tobacco: Former Systems developer    Types: Snuff, Chew  Substance and Sexual Activity  . Alcohol use: Yes    Comment: occasionally  . Drug use: Not Currently  . Sexual activity: Not Currently

## 2020-02-11 ENCOUNTER — Emergency Department (HOSPITAL_COMMUNITY)
Admission: EM | Admit: 2020-02-11 | Discharge: 2020-02-12 | Disposition: A | Discharge status: Home / Self Care | Payer: No Typology Code available for payment source | Attending: Emergency Medicine | Admitting: Emergency Medicine

## 2020-02-11 ENCOUNTER — Encounter (HOSPITAL_COMMUNITY): Payer: Self-pay | Admitting: Emergency Medicine

## 2020-02-11 ENCOUNTER — Other Ambulatory Visit: Payer: Self-pay

## 2020-02-11 DIAGNOSIS — T401X1A Poisoning by heroin, accidental (unintentional), initial encounter: Secondary | ICD-10-CM | POA: Insufficient documentation

## 2020-02-11 DIAGNOSIS — R000 Tachycardia, unspecified: Secondary | ICD-10-CM | POA: Diagnosis not present

## 2020-02-11 MED ORDER — NALOXONE HCL 2 MG/2ML IJ SOSY
2.0000 mg | PREFILLED_SYRINGE | Freq: Once | INTRAMUSCULAR | Status: AC
  Administered 2020-02-11: 2 mg via INTRAVENOUS

## 2020-02-11 MED ORDER — NALOXONE HCL 4 MG/0.1ML NA LIQD: NASAL | 1 refills | Status: AC

## 2020-02-11 NOTE — Discharge Instructions (Signed)
Follow-up at the [place].  Do not use heroin again in the future
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
## 2020-02-11 NOTE — ED Provider Notes (Signed)
[facility] EMERGENCY DEPARTMENT Provider Note   CSN: [id number] Arrival date & time: 02/11/20  2115     History Chief Complaint  Patient presents with  . Drug Overdose    Brian Huynh is a 59 y.o. male.  HPI   Patient was emergently brought to the bedside from triage.  According to the the triage nurse patient was dropped off by other individual.  The driver indicated the patient had overdosed on heroin.  Patient's oxygen saturation was in the 70s.  Patient was unable to speak and answer any questions.  He was given 2 mg of Narcan IV.  After administration of Narcan the patient immediately became alert and awake.  He is now answering questions.  Patient admits to overdosing on heroin.  He was not trying to harm himself.  Patient is upset that he is in the emergency room and is worried he might get arrested.  He denies any fevers chills.  No other complaints.  History reviewed. No pertinent past medical history.  There are no problems to display for this patient.   History reviewed. No pertinent surgical history.     History reviewed. No pertinent family history.  Social History   Tobacco Use  . Smoking status: Not on file  Substance Use Topics  . Alcohol use: Not on file  . Drug use: Not on file    Home Medications Prior to Admission medications   Medication Sig Start Date End Date Taking? Authorizing Provider  naloxone [facility]) nasal spray 4 mg/0.1 mL Use in the setting of accidental heroin overdose 02/11/20   Dorie Rank, MD    Allergies    Patient has no known allergies.  Review of Systems   Review of Systems  All other systems reviewed and are negative.   Physical Exam Updated Vital Signs BP (!) 170/108   Pulse (!) 110   Resp (!) 25   SpO2 100%   Physical Exam Vitals and nursing note reviewed.  Constitutional:      General: He is not in acute distress.    Appearance: He is well-developed.  HENT:     Head: Normocephalic and  atraumatic.     Right Ear: External ear normal.     Left Ear: External ear normal.  Eyes:     General: No scleral icterus.       Right eye: No discharge.        Left eye: No discharge.     Conjunctiva/sclera: Conjunctivae normal.  Neck:     Trachea: No tracheal deviation.  Cardiovascular:     Rate and Rhythm: Regular rhythm. Tachycardia present.  Pulmonary:     Effort: Pulmonary effort is normal. No respiratory distress.     Breath sounds: Normal breath sounds. No stridor. No wheezing or rales.  Abdominal:     General: Bowel sounds are normal. There is no distension.     Palpations: Abdomen is soft.     Tenderness: There is no abdominal tenderness. There is no guarding or rebound.  Musculoskeletal:        General: No tenderness.     Cervical back: Neck supple.  Skin:    General: Skin is warm and dry.     Findings: No rash.  Neurological:     Mental Status: He is alert.     Cranial Nerves: No cranial nerve deficit (no facial droop, extraocular movements intact, no slurred speech).     Sensory: No sensory deficit.  Motor: No abnormal muscle tone or seizure activity.     Coordination: Coordination normal.     ED Results / Procedures / Treatments   Labs (all labs ordered are listed, but only abnormal results are displayed) Labs Reviewed - No data to display  EKG None  Radiology No results found.  Procedures .1-3 Lead EKG Interpretation Performed by: Dorie Rank, MD Authorized by: Dorie Rank, MD     Interpretation: abnormal     ECG rate:  110   ECG rate assessment: tachycardic     Rhythm: sinus rhythm     Ectopy: none     Conduction: normal     (including critical care time)  Medications Ordered in ED Medications  naloxone (NARCAN) injection 2 mg (has no administration in time range)    ED Course  I have reviewed the triage vital signs and the nursing notes.  Pertinent labs & imaging results that were available during my care of the patient were  reviewed by me and considered in my medical decision making (see chart for details).  Clinical Course as of Feb 10 2241  Sat Feb 11, 2020  2237 Patient has been monitored in the ED.  He remains awake and alert.   [JK]    Clinical Course User Index [JK] Dorie Rank, MD   MDM Rules/Calculators/A&P                          Patient presented unresponsive after an accidental heroin overdose.  Patient immediately responded to Narcan.  Patient was monitored in the ED.  No further somnolence or difficulty with his breathing.  Patient states he will never do that again.  I will place a consult peer support.  I will also give a prescription for Narcan Final Clinical Impression(s) / ED Diagnoses Final diagnoses:  Accidental overdose of heroin, initial encounter [facility])    Rx / DC Orders ED Discharge Orders         Ordered    naloxone (NARCAN) nasal spray 4 mg/0.1 mL        02/11/20 2240           Dorie Rank, MD 02/11/20 2242

## 2020-02-12 NOTE — ED Notes (Signed)
Discharge instructions discussed with pt. Pt verbalized understanding. Pt stable and ambulatory. No signature pad avialable

## 2020-02-13 ENCOUNTER — Encounter (INDEPENDENT_AMBULATORY_CARE_PROVIDER_SITE_OTHER): Payer: Self-pay | Admitting: Orthopaedic Surgery

## 2020-02-29 ENCOUNTER — Encounter (HOSPITAL_COMMUNITY): Payer: Self-pay

## 2020-02-29 ENCOUNTER — Emergency Department (HOSPITAL_COMMUNITY): Payer: No Typology Code available for payment source

## 2020-02-29 ENCOUNTER — Emergency Department (HOSPITAL_COMMUNITY)
Admission: EM | Admit: 2020-02-29 | Discharge: 2020-02-29 | Disposition: A | Discharge status: Home / Self Care | Payer: No Typology Code available for payment source | Attending: Emergency Medicine | Admitting: Emergency Medicine

## 2020-02-29 DIAGNOSIS — F17210 Nicotine dependence, cigarettes, uncomplicated: Secondary | ICD-10-CM | POA: Insufficient documentation

## 2020-02-29 DIAGNOSIS — L03032 Cellulitis of left toe: Secondary | ICD-10-CM

## 2020-02-29 DIAGNOSIS — Z79899 Other long term (current) drug therapy: Secondary | ICD-10-CM | POA: Insufficient documentation

## 2020-02-29 DIAGNOSIS — Y9355 Activity, bike riding: Secondary | ICD-10-CM | POA: Diagnosis not present

## 2020-02-29 DIAGNOSIS — Y9289 Other specified places as the place of occurrence of the external cause: Secondary | ICD-10-CM | POA: Diagnosis not present

## 2020-02-29 DIAGNOSIS — Z7901 Long term (current) use of anticoagulants: Secondary | ICD-10-CM | POA: Insufficient documentation

## 2020-02-29 DIAGNOSIS — L089 Local infection of the skin and subcutaneous tissue, unspecified: Secondary | ICD-10-CM

## 2020-02-29 DIAGNOSIS — I10 Essential (primary) hypertension: Secondary | ICD-10-CM | POA: Diagnosis not present

## 2020-02-29 DIAGNOSIS — W19XXXA Unspecified fall, initial encounter: Secondary | ICD-10-CM

## 2020-02-29 DIAGNOSIS — Z7982 Long term (current) use of aspirin: Secondary | ICD-10-CM | POA: Insufficient documentation

## 2020-02-29 DIAGNOSIS — M25532 Pain in left wrist: Secondary | ICD-10-CM

## 2020-02-29 DIAGNOSIS — S6992XA Unspecified injury of left wrist, hand and finger(s), initial encounter: Secondary | ICD-10-CM | POA: Diagnosis present

## 2020-02-29 DIAGNOSIS — Y999 Unspecified external cause status: Secondary | ICD-10-CM | POA: Insufficient documentation

## 2020-02-29 MED ORDER — DOXYCYCLINE HYCLATE 100 MG PO CAPS: 100.0000 mg | ORAL_CAPSULE | Freq: Two times a day (BID) | ORAL | 0 refills | Status: AC

## 2020-02-29 MED ORDER — MORPHINE SULFATE (PF) 4 MG/ML IV SOLN
4.0000 mg | Freq: Once | INTRAVENOUS | Status: AC
  Administered 2020-02-29: 4 mg via INTRAMUSCULAR
  Filled 2020-02-29: qty 1
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
## 2020-02-29 NOTE — ED Notes (Addendum)
Patient given bus pass, Band-Aids, and new socks.
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
## 2020-05-19 ENCOUNTER — Inpatient Hospital Stay (HOSPITAL_COMMUNITY)
Admission: EM | Admit: 2020-05-19 | Discharge: 2020-05-22 | DRG: 091 | Discharge status: Against Medical Advice | Payer: No Typology Code available for payment source | Attending: Internal Medicine | Admitting: Internal Medicine

## 2020-05-19 ENCOUNTER — Inpatient Hospital Stay (HOSPITAL_COMMUNITY): Payer: No Typology Code available for payment source

## 2020-05-19 ENCOUNTER — Emergency Department (HOSPITAL_COMMUNITY): Payer: No Typology Code available for payment source

## 2020-05-19 ENCOUNTER — Encounter (HOSPITAL_COMMUNITY): Payer: Self-pay | Admitting: Internal Medicine

## 2020-05-19 DIAGNOSIS — G9340 Encephalopathy, unspecified: Secondary | ICD-10-CM | POA: Diagnosis present

## 2020-05-19 DIAGNOSIS — R4182 Altered mental status, unspecified: Principal | ICD-10-CM

## 2020-05-19 DIAGNOSIS — F1910 Other psychoactive substance abuse, uncomplicated: Secondary | ICD-10-CM

## 2020-05-19 DIAGNOSIS — J9601 Acute respiratory failure with hypoxia: Secondary | ICD-10-CM

## 2020-05-19 DIAGNOSIS — T68XXXA Hypothermia, initial encounter: Secondary | ICD-10-CM

## 2020-05-19 DIAGNOSIS — R6889 Other general symptoms and signs: Secondary | ICD-10-CM

## 2020-05-19 DIAGNOSIS — N179 Acute kidney failure, unspecified: Secondary | ICD-10-CM | POA: Diagnosis present

## 2020-05-19 DIAGNOSIS — J690 Pneumonitis due to inhalation of food and vomit: Secondary | ICD-10-CM | POA: Diagnosis present

## 2020-05-19 DIAGNOSIS — I2510 Atherosclerotic heart disease of native coronary artery without angina pectoris: Secondary | ICD-10-CM | POA: Diagnosis present

## 2020-05-19 DIAGNOSIS — Z8673 Personal history of transient ischemic attack (TIA), and cerebral infarction without residual deficits: Secondary | ICD-10-CM

## 2020-05-19 DIAGNOSIS — G8929 Other chronic pain: Secondary | ICD-10-CM | POA: Diagnosis present

## 2020-05-19 DIAGNOSIS — Z5329 Procedure and treatment not carried out because of patient's decision for other reasons: Secondary | ICD-10-CM | POA: Diagnosis not present

## 2020-05-19 DIAGNOSIS — Y929 Unspecified place or not applicable: Secondary | ICD-10-CM | POA: Diagnosis not present

## 2020-05-19 DIAGNOSIS — Z20822 Contact with and (suspected) exposure to covid-19: Secondary | ICD-10-CM | POA: Diagnosis present

## 2020-05-19 DIAGNOSIS — M549 Dorsalgia, unspecified: Secondary | ICD-10-CM | POA: Diagnosis present

## 2020-05-19 DIAGNOSIS — E7220 Disorder of urea cycle metabolism, unspecified: Secondary | ICD-10-CM | POA: Diagnosis present

## 2020-05-19 DIAGNOSIS — T6591XA Toxic effect of unspecified substance, accidental (unintentional), initial encounter: Secondary | ICD-10-CM | POA: Diagnosis present

## 2020-05-19 DIAGNOSIS — F1410 Cocaine abuse, uncomplicated: Secondary | ICD-10-CM | POA: Diagnosis present

## 2020-05-19 DIAGNOSIS — G928 Other toxic encephalopathy: Principal | ICD-10-CM | POA: Diagnosis present

## 2020-05-19 DIAGNOSIS — I4891 Unspecified atrial fibrillation: Secondary | ICD-10-CM | POA: Diagnosis present

## 2020-05-19 DIAGNOSIS — I10 Essential (primary) hypertension: Secondary | ICD-10-CM | POA: Diagnosis present

## 2020-05-19 DIAGNOSIS — B182 Chronic viral hepatitis C: Secondary | ICD-10-CM | POA: Diagnosis present

## 2020-05-19 HISTORY — DX: Other psychoactive substance abuse, uncomplicated: F19.10

## 2020-05-19 HISTORY — DX: Essential (primary) hypertension: I10

## 2020-05-19 HISTORY — DX: Atherosclerotic heart disease of native coronary artery without angina pectoris: I25.10

## 2020-05-19 LAB — URINALYSIS, ROUTINE W REFLEX MICROSCOPIC
Bilirubin Urine: NEGATIVE
Bilirubin Urine: NEGATIVE
Glucose, UA: 150 mg/dL — AB
Glucose, UA: 50 mg/dL — AB
Hgb urine dipstick: NEGATIVE
Hgb urine dipstick: NEGATIVE
Ketones, ur: NEGATIVE mg/dL
Ketones, ur: NEGATIVE mg/dL
Leukocytes,Ua: NEGATIVE
Leukocytes,Ua: NEGATIVE
Nitrite: NEGATIVE
Nitrite: NEGATIVE
Protein, ur: NEGATIVE mg/dL
Protein, ur: NEGATIVE mg/dL
Specific Gravity, Urine: 1.014 (ref 1.005–1.030)
Specific Gravity, Urine: 1.015 (ref 1.005–1.030)
pH: 5 (ref 5.0–8.0)
pH: 5 (ref 5.0–8.0)

## 2020-05-19 LAB — I-STAT ARTERIAL BLOOD GAS, ED
Acid-Base Excess: 4 mmol/L — ABNORMAL HIGH (ref 0.0–2.0)
Bicarbonate: 31 mmol/L — ABNORMAL HIGH (ref 20.0–28.0)
Calcium, Ion: 1.11 mmol/L — ABNORMAL LOW (ref 1.15–1.40)
HCT: 38 % — ABNORMAL LOW (ref 39.0–52.0)
Hemoglobin: 12.9 g/dL — ABNORMAL LOW (ref 13.0–17.0)
O2 Saturation: 100 %
Patient temperature: 98.6
Potassium: 4 mmol/L (ref 3.5–5.1)
Sodium: 144 mmol/L (ref 135–145)
TCO2: 33 mmol/L — ABNORMAL HIGH (ref 22–32)
pCO2 arterial: 57.2 mmHg — ABNORMAL HIGH (ref 32.0–48.0)
pH, Arterial: 7.342 — ABNORMAL LOW (ref 7.350–7.450)
pO2, Arterial: 487 mmHg — ABNORMAL HIGH (ref 83.0–108.0)

## 2020-05-19 LAB — GLUCOSE, CAPILLARY
Glucose-Capillary: 103 mg/dL — ABNORMAL HIGH (ref 70–99)
Glucose-Capillary: 87 mg/dL (ref 70–99)
Glucose-Capillary: 92 mg/dL (ref 70–99)

## 2020-05-19 LAB — RESP PANEL BY RT-PCR (FLU A&B, COVID) ARPGX2
Influenza A by PCR: NEGATIVE
Influenza B by PCR: NEGATIVE
SARS Coronavirus 2 by RT PCR: NEGATIVE

## 2020-05-19 LAB — RAPID URINE DRUG SCREEN, HOSP PERFORMED
Amphetamines: NOT DETECTED
Barbiturates: NOT DETECTED
Benzodiazepines: NOT DETECTED
Cocaine: POSITIVE — AB
Opiates: POSITIVE — AB
Tetrahydrocannabinol: NOT DETECTED

## 2020-05-19 LAB — TROPONIN I (HIGH SENSITIVITY)
Troponin I (High Sensitivity): 18 ng/L — ABNORMAL HIGH (ref <18)
Troponin I (High Sensitivity): 36 ng/L — ABNORMAL HIGH (ref <18)

## 2020-05-19 LAB — HEPATIC FUNCTION PANEL
ALT: 39 U/L (ref 0–44)
AST: 37 U/L (ref 15–41)
Albumin: 2.8 g/dL — ABNORMAL LOW (ref 3.5–5.0)
Alkaline Phosphatase: 60 U/L (ref 38–126)
Bilirubin, Direct: 0.2 mg/dL (ref 0.0–0.2)
Indirect Bilirubin: 0.6 mg/dL (ref 0.3–0.9)
Total Bilirubin: 0.8 mg/dL (ref 0.3–1.2)
Total Protein: 6.2 g/dL — ABNORMAL LOW (ref 6.5–8.1)

## 2020-05-19 LAB — LACTIC ACID, PLASMA
Lactic Acid, Venous: 5.9 mmol/L (ref 0.5–1.9)
Lactic Acid, Venous: 1.9 mmol/L (ref 0.5–1.9)

## 2020-05-19 LAB — I-STAT CHEM 8, ED
BUN: 37 mg/dL — ABNORMAL HIGH (ref 6–20)
Calcium, Ion: 1.15 mmol/L (ref 1.15–1.40)
Chloride: 102 mmol/L (ref 98–111)
Creatinine, Ser: 1.6 mg/dL — ABNORMAL HIGH (ref 0.61–1.24)
Glucose, Bld: 308 mg/dL — ABNORMAL HIGH (ref 70–99)
HCT: 52 % (ref 39.0–52.0)
Hemoglobin: 17.7 g/dL — ABNORMAL HIGH (ref 13.0–17.0)
Potassium: 5.3 mmol/L — ABNORMAL HIGH (ref 3.5–5.1)
Sodium: 140 mmol/L (ref 135–145)
TCO2: 28 mmol/L (ref 22–32)

## 2020-05-19 LAB — TSH: TSH: 8.402 u[IU]/mL — ABNORMAL HIGH (ref 0.350–4.500)

## 2020-05-19 LAB — MAGNESIUM: Magnesium: 3 mg/dL — ABNORMAL HIGH (ref 1.7–2.4)

## 2020-05-19 LAB — CBC
HCT: 52.1 % — ABNORMAL HIGH (ref 39.0–52.0)
Hemoglobin: 16.2 g/dL (ref 13.0–17.0)
MCH: 32.4 pg (ref 26.0–34.0)
MCHC: 31.1 g/dL (ref 30.0–36.0)
MCV: 104.2 fL — ABNORMAL HIGH (ref 80.0–100.0)
Platelets: 256 10*3/uL (ref 150–400)
RBC: 5 MIL/uL (ref 4.22–5.81)
RDW: 13.4 % (ref 11.5–15.5)
WBC: 14.8 10*3/uL — ABNORMAL HIGH (ref 4.0–10.5)
nRBC: 0 % (ref 0.0–0.2)

## 2020-05-19 LAB — ACETAMINOPHEN LEVEL: Acetaminophen (Tylenol), Serum: <10 ug/mL — ABNORMAL LOW (ref 10–30)

## 2020-05-19 LAB — AMMONIA: Ammonia: 73 umol/L — ABNORMAL HIGH (ref 9–35)

## 2020-05-19 LAB — BASIC METABOLIC PANEL
Anion gap: 11 (ref 5–15)
BUN: 23 mg/dL — ABNORMAL HIGH (ref 6–20)
CO2: 24 mmol/L (ref 22–32)
Calcium: 7.6 mg/dL — ABNORMAL LOW (ref 8.9–10.3)
Chloride: 106 mmol/L (ref 98–111)
Creatinine, Ser: 1.37 mg/dL — ABNORMAL HIGH (ref 0.61–1.24)
GFR, Estimated: 59 mL/min — ABNORMAL LOW (ref >60)
Glucose, Bld: 88 mg/dL (ref 70–99)
Potassium: 4.2 mmol/L (ref 3.5–5.1)
Sodium: 141 mmol/L (ref 135–145)

## 2020-05-19 LAB — HIV ANTIBODY (ROUTINE TESTING W REFLEX): HIV Screen 4th Generation wRfx: NONREACTIVE

## 2020-05-19 LAB — CK: Total CK: 111 U/L (ref 49–397)

## 2020-05-19 LAB — HEMOGLOBIN A1C
Hgb A1c MFr Bld: 5 % (ref 4.8–5.6)
Mean Plasma Glucose: 96.8 mg/dL

## 2020-05-19 LAB — ETHANOL: Alcohol, Ethyl (B): <10 mg/dL (ref <10)

## 2020-05-19 LAB — CBG MONITORING, ED: Glucose-Capillary: 321 mg/dL — ABNORMAL HIGH (ref 70–99)

## 2020-05-19 LAB — T4, FREE: Free T4: 1.03 ng/dL (ref 0.61–1.12)

## 2020-05-19 LAB — MRSA PCR SCREENING: MRSA by PCR: NEGATIVE

## 2020-05-19 LAB — SALICYLATE LEVEL: Salicylate Lvl: <7 mg/dL — ABNORMAL LOW (ref 7.0–30.0)

## 2020-05-19 MED ORDER — CHLORHEXIDINE GLUCONATE CLOTH 2 % EX PADS: 6.0000 | MEDICATED_PAD | Freq: Every day | CUTANEOUS | Status: DC

## 2020-05-19 MED ORDER — SODIUM CHLORIDE 0.9 % IV SOLN
3.0000 g | Freq: Four times a day (QID) | INTRAVENOUS | Status: DC
  Administered 2020-05-19 – 2020-05-20 (×4): 3 g via INTRAVENOUS
  Filled 2020-05-19 (×7): qty 8

## 2020-05-19 MED ORDER — ORAL CARE MOUTH RINSE
15.0000 mL | OROMUCOSAL | Status: DC
  Administered 2020-05-19 – 2020-05-20 (×9): 15 mL via OROMUCOSAL

## 2020-05-19 MED ORDER — PROPOFOL 1000 MG/100ML IV EMUL
INTRAVENOUS | Status: AC
  Filled 2020-05-19: qty 100

## 2020-05-19 MED ORDER — DOCUSATE SODIUM 50 MG/5ML PO LIQD
100.0000 mg | Freq: Two times a day (BID) | ORAL | Status: DC
  Administered 2020-05-19 (×2): 100 mg
  Filled 2020-05-19 (×2): qty 10

## 2020-05-19 MED ORDER — SUCCINYLCHOLINE CHLORIDE 20 MG/ML IJ SOLN
INTRAMUSCULAR | Status: AC | PRN
  Administered 2020-05-19: 100 mg via INTRAVENOUS

## 2020-05-19 MED ORDER — ONDANSETRON HCL 4 MG/2ML IJ SOLN: 4.0000 mg | Freq: Four times a day (QID) | INTRAMUSCULAR | Status: DC | PRN

## 2020-05-19 MED ORDER — PROPOFOL 1000 MG/100ML IV EMUL
5.0000 ug/kg/min | INTRAVENOUS | Status: DC
  Administered 2020-05-19: 15 ug/kg/min via INTRAVENOUS

## 2020-05-19 MED ORDER — PROPOFOL 1000 MG/100ML IV EMUL
0.0000 ug/kg/min | INTRAVENOUS | Status: DC
  Administered 2020-05-19: 10 ug/kg/min via INTRAVENOUS
  Administered 2020-05-19: 50 ug/kg/min via INTRAVENOUS
  Administered 2020-05-19: 20 ug/kg/min via INTRAVENOUS
  Administered 2020-05-20 (×2): 40 ug/kg/min via INTRAVENOUS
  Filled 2020-05-19 (×4): qty 100

## 2020-05-19 MED ORDER — PANTOPRAZOLE SODIUM 40 MG PO PACK
40.0000 mg | PACK | Freq: Every day | ORAL | Status: DC
  Administered 2020-05-19: 40 mg
  Filled 2020-05-19 (×2): qty 20

## 2020-05-19 MED ORDER — ACETAMINOPHEN 325 MG PO TABS: 650.0000 mg | ORAL_TABLET | Freq: Four times a day (QID) | ORAL | Status: DC | PRN

## 2020-05-19 MED ORDER — ROCURONIUM BROMIDE 10 MG/ML (PF) SYRINGE
PREFILLED_SYRINGE | INTRAVENOUS | Status: AC
  Filled 2020-05-19: qty 10

## 2020-05-19 MED ORDER — LACTATED RINGERS IV SOLN: INTRAVENOUS | Status: DC

## 2020-05-19 MED ORDER — SODIUM CHLORIDE 0.9 % IV BOLUS (SEPSIS)
2000.0000 mL | Freq: Once | INTRAVENOUS | Status: AC
  Administered 2020-05-19: 2000 mL via INTRAVENOUS

## 2020-05-19 MED ORDER — POLYETHYLENE GLYCOL 3350 17 G PO PACK
17.0000 g | PACK | Freq: Every day | ORAL | Status: DC
  Administered 2020-05-19: 17 g
  Filled 2020-05-19: qty 1

## 2020-05-19 MED ORDER — FENTANYL CITRATE (PF) 100 MCG/2ML IJ SOLN: 50.0000 ug | INTRAMUSCULAR | Status: DC | PRN

## 2020-05-19 MED ORDER — ETOMIDATE 2 MG/ML IV SOLN
INTRAVENOUS | Status: AC | PRN
  Administered 2020-05-19: 20 mg via INTRAVENOUS

## 2020-05-19 MED ORDER — LORAZEPAM 2 MG/ML IJ SOLN
2.0000 mg | Freq: Once | INTRAMUSCULAR | Status: AC
  Administered 2020-05-19: 2 mg via INTRAVENOUS

## 2020-05-19 MED ORDER — CHLORHEXIDINE GLUCONATE CLOTH 2 % EX PADS
6.0000 | MEDICATED_PAD | Freq: Every day | CUTANEOUS | Status: DC
  Administered 2020-05-20: 6 via TOPICAL

## 2020-05-19 MED ORDER — VITAL HIGH PROTEIN PO LIQD
1000.0000 mL | ORAL | Status: DC
  Administered 2020-05-19: 1000 mL
  Filled 2020-05-19: qty 1000

## 2020-05-19 MED ORDER — SODIUM CHLORIDE 0.9 % IV SOLN
2.0000 g | Freq: Once | INTRAVENOUS | Status: AC
  Administered 2020-05-19: 2 g via INTRAVENOUS
  Filled 2020-05-19: qty 2

## 2020-05-19 MED ORDER — DOCUSATE SODIUM 100 MG PO CAPS: 100.0000 mg | ORAL_CAPSULE | Freq: Two times a day (BID) | ORAL | Status: DC | PRN

## 2020-05-19 MED ORDER — FENTANYL CITRATE (PF) 100 MCG/2ML IJ SOLN
INTRAMUSCULAR | Status: AC
  Filled 2020-05-19: qty 2

## 2020-05-19 MED ORDER — CHLORHEXIDINE GLUCONATE 0.12% ORAL RINSE (MEDLINE KIT)
15.0000 mL | Freq: Two times a day (BID) | OROMUCOSAL | Status: DC
  Administered 2020-05-19 – 2020-05-20 (×3): 15 mL via OROMUCOSAL

## 2020-05-19 MED ORDER — MIDAZOLAM HCL 2 MG/2ML IJ SOLN
INTRAMUSCULAR | Status: AC
  Filled 2020-05-19: qty 4

## 2020-05-19 MED ORDER — LEVETIRACETAM IN NACL 1000 MG/100ML IV SOLN
1000.0000 mg | Freq: Once | INTRAVENOUS | Status: AC
  Administered 2020-05-19: 1000 mg via INTRAVENOUS
  Filled 2020-05-19: qty 100

## 2020-05-19 MED ORDER — SODIUM CHLORIDE 0.9 % IV SOLN
INTRAVENOUS | Status: DC | PRN
  Administered 2020-05-19: 250 mL via INTRAVENOUS

## 2020-05-19 MED ORDER — HEPARIN SODIUM (PORCINE) 5000 UNIT/ML IJ SOLN
5000.0000 [IU] | Freq: Three times a day (TID) | INTRAMUSCULAR | Status: DC
  Administered 2020-05-19 – 2020-05-22 (×8): 5000 [IU] via SUBCUTANEOUS
  Filled 2020-05-19 (×9): qty 1

## 2020-05-19 MED ORDER — FENTANYL CITRATE (PF) 100 MCG/2ML IJ SOLN
50.0000 ug | INTRAMUSCULAR | Status: DC | PRN
  Administered 2020-05-19 – 2020-05-20 (×4): 100 ug via INTRAVENOUS
  Filled 2020-05-19 (×4): qty 2

## 2020-05-19 MED ORDER — LACTULOSE 10 GM/15ML PO SOLN
20.0000 g | Freq: Two times a day (BID) | ORAL | Status: DC
  Administered 2020-05-19 (×2): 20 g
  Filled 2020-05-19 (×2): qty 30

## 2020-05-19 MED ORDER — INSULIN ASPART 100 UNIT/ML ~~LOC~~ SOLN
0.0000 [IU] | SUBCUTANEOUS | Status: DC
  Administered 2020-05-20: 2 [IU] via SUBCUTANEOUS
  Administered 2020-05-21: 1 [IU] via SUBCUTANEOUS

## 2020-05-19 MED ORDER — ETOMIDATE 2 MG/ML IV SOLN
INTRAVENOUS | Status: AC
  Filled 2020-05-19: qty 20

## 2020-05-19 MED ORDER — LORAZEPAM 2 MG/ML IJ SOLN
INTRAMUSCULAR | Status: AC
  Filled 2020-05-19: qty 1

## 2020-05-19 MED ORDER — SODIUM CHLORIDE 0.9 % IV BOLUS (SEPSIS)
1000.0000 mL | Freq: Once | INTRAVENOUS | Status: AC
  Administered 2020-05-19: 1000 mL via INTRAVENOUS

## 2020-05-19 MED ORDER — VANCOMYCIN HCL IN DEXTROSE 1-5 GM/200ML-% IV SOLN
1000.0000 mg | Freq: Once | INTRAVENOUS | Status: AC
  Administered 2020-05-19: 1000 mg via INTRAVENOUS
  Filled 2020-05-19: qty 200

## 2020-05-19 MED ORDER — POLYETHYLENE GLYCOL 3350 17 G PO PACK: 17.0000 g | PACK | Freq: Every day | ORAL | Status: DC | PRN

## 2020-05-19 MED ORDER — DOCUSATE SODIUM 50 MG/5ML PO LIQD: 100.0000 mg | Freq: Two times a day (BID) | ORAL | Status: DC | PRN

## 2020-05-19 NOTE — ED Notes (Signed)
Daughter: Verdell Face [phone] traveling from [place]

## 2020-05-19 NOTE — Progress Notes (Signed)
Pharmacy Antibiotic Note  Brian Huynh is a 59 y.o. male admitted on 05/19/2020 with aspriation pneumonia.  Pharmacy has been consulted for Unasyn dosing.  Patient had Cefepime 2g x1 this AM at 0840AM.  WBC elevated, Lactic acid trending down.   Plan: Unasyn 3g IV q6h  Monitor renal function, culture results, and clinical status.   Height: 5\' 10"  (177.8 cm) Weight: 90.7 kg (200 lb) (from 2019 records, please update) IBW/kg (Calculated) : 73  Temp (24hrs), Avg:97.7 F (36.5 C), Min:93 F (33.9 C), Max:99.5 F (37.5 C)  Recent Labs  Lab 05/19/20 0515 05/19/20 0521 05/19/20 0725  WBC 14.8*  --   --   CREATININE  --  1.60* 1.37*  LATICACIDVEN 5.9*  --  1.9    Estimated Creatinine Clearance: 65 mL/min (A) (by C-G formula based on SCr of 1.37 mg/dL (H)).    Allergies  Allergen Reactions  . Protamine Other (See Comments)    hypotension  . Diclofenac Rash    Antimicrobials this admission: Cefepime 11/27 x1 Unasyn 11/27 >>  Dose adjustments this admission:   Microbiology results: pending  Thank you for allowing pharmacy to be a part of this patient's care.  Brian Huynh 05/19/2020 9:40 AM

## 2020-05-19 NOTE — Progress Notes (Signed)
eLink Physician-Brief Progress Note Patient Name: Brian Huynh DOB: Oct 23, 1960 MRN: [id number]   Date of Service  05/19/2020  HPI/Events of Note  CBG 92. Patient intubated but not on tube feeds. No contraindications to TF.   eICU Interventions  Start trickle feeds at 20cc/hr.     Intervention Category Intermediate Interventions: Other:  Brian Huynh 05/19/2020, 9:10 PM

## 2020-05-19 NOTE — Progress Notes (Signed)
CBG 92 @ 1030

## 2020-05-19 NOTE — ED Notes (Signed)
Bair hugger applied.
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
## 2020-05-19 NOTE — Progress Notes (Signed)
eLink Physician-Brief Progress Note Patient Name: Brian Huynh DOB: 06/12/1961 MRN: [id number]   Date of Service  05/19/2020  HPI/Events of Note  RN notes patient is not sufficiently safe to take PO meds due to relatively sedate state. Due for seroquel, Norco, and eliquis (for Afib) tonight.   eICU Interventions  No need for seroquel or Norco given he is relatively sedate as is.  OK to miss 1 dose of Eliquis as this is for Afib only. If he continues to be unable to tolerate PO tomorrow we can re-evaluate.     Intervention Category Intermediate Interventions: Other:  Charlott Rakes 05/19/2020, 9:13 PM

## 2020-05-19 NOTE — H&P (Signed)
NAME:  Brian Huynh, MRN:  [id number], DOB:  1960/12/11, LOS: 0 ADMISSION DATE:  05/19/2020, CONSULTATION DATE:  11/27 REFERRING MD:  Dr. Jeanell Sparrow, CHIEF COMPLAINT:  AMS    Brief History   60 y/o M admitted 11/27 to [facility] after being found down, altered at a bus stop.  UDS positive for cocaine, opiates.  Pt hypothermic, intubated for airway protection.   History of present illness   See linked chart for further information.     59 y/o M who presented to [facility] ER via EMS after being found down at a bus stop.   Per report, the patient was found face down in emesis.  Unknown downtime.  He was given intranasal Narcan without significant response.  NPA was placed and BVM required to assist ventilation.  On arrival to ER, the patient was intubated for airway protection. He was never pulseless.  Initial exam was reported as questionable posturing / altered / unresponsive.  CXR post intubation showed that the ETT was high and it was advanced, essentially clear.  CT of the head and neck were evaluated which were negative for acute process.  However, they did show old strokes.  ETOH level was negative. UDS was positive for cocaine and opiates.  The patient was hypothermic and hypotensive on arrival.  He was treated as possible sepsis and given 39ml/kg fluid resuscitation.  Additionally, he was given keppra and ativan for seizure (not witnessed, concern for withdrawal seizure in differential).  Pan cultures were obtained. Initial labs - Na 140, K 5.3, glucose 308, BUN 37, Sr Cr 1.60, ammonia 73, troponin 36, lactic acid 1.9, WBC 14.8, Hgb 16.2 and platelets 256.  He was sedated with propofol in the ER.   PCCM called for ICU admission.   Past Medical History  Polysubstance abuse - ETOH, heroin, cocaine, tobacco  CVA HTN   Significant Hospital Events   11/27 Admit with AMS  Consults:    Procedures:  ETT 11/27 >>   Significant Diagnostic Tests:   CT Head 11/27 >> no acute process, multiple areas  of possible old stroke   CT Neck 11/27 >> negative   EEG 11/27 >>   UDS 11/27 >> positive for cocaine, opiates   Micro Data:  COVID / Flu 11/27 >> negative  UA 11/27 >> negative  BCx2 11/27 >>  Tracheal Aspirate 11/27 >>   Antimicrobials:  Cefepime 11/27 x1  Vanco 11/27 x1  Unasyn 11/27 (empiric aspiration coverage) >>    Interim history/subjective:  Tmax 99.1  Pt on 10 mcg propofol   Objective   Blood pressure (!) 85/54, pulse 81, temperature 99.4 F (37.4 C), resp. rate 20, height 5\' 10"  (1.778 m), weight 90.7 kg, SpO2 96 %.    Vent Mode: PRVC FiO2 (%):  [40 %-100 %] 40 % Set Rate:  [12 bmp-20 bmp] 20 bmp Vt Set:  [580 mL] 580 mL PEEP:  [5 cmH20] 5 cmH20 Plateau Pressure:  [13 cmH20-16 cmH20] 13 cmH20   Intake/Output Summary (Last 24 hours) at 05/19/2020 0912 Last data filed at 05/19/2020 9833 Gross per 24 hour  Intake 2100 ml  Output --  Net 2100 ml   Filed Weights   05/19/20 0506  Weight: 90.7 kg    Examination: General:  Ill appearing adult male lying in bed in NAD on vent HEENT: MM pink/moist, ETT, pupils 47mm=/reactive, anicteric, dried bloody secretions on face Neuro: sedate, awakens to voice when propofol paused, opens eyes / follows commands, MAE CV: s1s2  RRR, no m/r/g PULM: non-labored on vent, lungs bilaterally clear GI: soft, bsx4 active, coffee ground drainage from NGT  Extremities: warm/dry, no edema  Skin: multiple tattoos, dry ulceration on right great toe  Resolved Hospital Problem list      Assessment & Plan:   Acute Metabolic Encephalopathy  Suspected polysubstance abuse / overdose. Found down, unknown downtime.  Lactic acid negative. Elevated ammonia.  ETOH negative which raises question of withdrawal seizure. Currently following commands, non-focal exam. Old CVA's seen on imaging, no acute process -PAD protocol with propofol for sedation, PRN fentanyl for pain   -assess EEG -seizure precautions  -keep cervical collar until soft  tissue can be cleared -follow frequent neuro exam  -hold further keppra for now as no witnessed seizure   Acute Respiratory Failure secondary to Encephalopathy  -PRVC 8cc/kg  -note ETT adjusted per EDP post CXR 11/27  -wean PEEP / fiO2 for sats 88-94% -daily SBT / WUA  -sedation as above  -follow intermittent CXR   Rule Out Sepsis / Infectious Etiology  Possible Aspiration -empiric unasyn for potential aspiration (found down in emesis / face down) -pan culture -PRN tylenol for fever   AKI  Baseline sr cr ~ 1 -rule out rhabdo, assess CK  -LR at 133ml /hr  -Trend BMP / urinary output -Replace electrolytes as indicated -Avoid nephrotoxic agents, ensure adequate renal perfusion  Hyperammoniemia  -follow ammonia  -lactulose PT BID  ? Coffee Ground Emesis  -PPI  -monitor output   Hyperglycemia  -SSI, sensitive scale   Hepatitis C -supportive care, unclear if ever treated   Hx HTN, CAD -hold home antihypertensives   Best practice (evaluated daily)   Diet: NPO  Pain/Anxiety/Delirium protocol (if indicated): PAD protocol  VAP protocol (if indicated): in place  DVT prophylaxis: heparin SQ  GI prophylaxis: PPI  Glucose control: SSI Mobility: BR, progress as tolerated  Last date of multidisciplinary goals of care discussion: Pending, updated per EDP 11/27. Daughter - Verdell Face [phone]  Family and staff present: pending  Summary of discussion: pending  Follow up goals of care discussion due: 12/4.   Code Status: Full Code  Disposition: ICU   Labs   CBC: Recent Labs  Lab 05/19/20 0515 05/19/20 0521 05/19/20 0810  WBC 14.8*  --   --   HGB 16.2 17.7* 12.9*  HCT 52.1* 52.0 38.0*  MCV 104.2*  --   --   PLT 256  --   --     Basic Metabolic Panel: Recent Labs  Lab 05/19/20 0515 05/19/20 0521 05/19/20 0810  NA  --  140 144  K  --  5.3* 4.0  CL  --  102  --   GLUCOSE  --  308*  --   BUN  --  37*  --   CREATININE  --  1.60*  --   MG 3.0*  --    --    GFR: Estimated Creatinine Clearance: 55.6 mL/min (A) (by C-G formula based on SCr of 1.6 mg/dL (H)). Recent Labs  Lab 05/19/20 0515 05/19/20 0725  WBC 14.8*  --   LATICACIDVEN 5.9* 1.9    Liver Function Tests: No results for input(s): AST, ALT, ALKPHOS, BILITOT, PROT, ALBUMIN in the last 168 hours. No results for input(s): LIPASE, AMYLASE in the last 168 hours. Recent Labs  Lab 05/19/20 0546  AMMONIA 73*    ABG    Component Value Date/Time   PHART 7.342 (L) 05/19/2020 0810   PCO2ART 57.2 (H) 05/19/2020  0810   PO2ART 487 (H) 05/19/2020 0810   HCO3 31.0 (H) 05/19/2020 0810   TCO2 33 (H) 05/19/2020 0810   O2SAT 100.0 05/19/2020 0810     Coagulation Profile: No results for input(s): INR, PROTIME in the last 168 hours.  Cardiac Enzymes: No results for input(s): CKTOTAL, CKMB, CKMBINDEX, TROPONINI in the last 168 hours.  HbA1C: No results found for: HGBA1C  CBG: Recent Labs  Lab 05/19/20 0505  GLUCAP 321*    Review of Systems:   Unable to complete as patient is altered on mechanical ventilation   Past Medical History  He,  has a past medical history of Anxiety, CAD (coronary artery disease), Chronic back pain, CVA (cerebral vascular accident) ([place]), Hepatitis C, HTN (hypertension), and Polysubstance abuse ([place]).   Surgical History   History reviewed. No pertinent surgical history.   Social History      Family History   His family history is not on file.   Allergies Allergies  Allergen Reactions  . Protamine Other (See Comments)    hypotension  . Diclofenac Rash     Home Medications  Prior to Admission medications   Not on File     Critical care time: 40 minutes     Noe Gens, MSN, NP-C, AGACNP-BC [facility] 05/19/2020, 9:13 AM   Please see [email or web address] for pager details.

## 2020-05-19 NOTE — Plan of Care (Signed)
  Problem: Education: Goal: Knowledge of General Education information will improve Description: Including pain rating scale, medication(s)/side effects and non-pharmacologic comfort measures Outcome: Not Progressing   Problem: Health Behavior/Discharge Planning: Goal: Ability to manage health-related needs will improve Outcome: Not Progressing   Problem: Clinical Measurements: Goal: Ability to maintain clinical measurements within normal limits will improve Outcome: Not Progressing Goal: Diagnostic test results will improve Outcome: Progressing Goal: Respiratory complications will improve Outcome: Progressing Goal: Cardiovascular complication will be avoided Outcome: Progressing   Problem: Nutrition: Goal: Adequate nutrition will be maintained Outcome: Not Progressing

## 2020-05-19 NOTE — Progress Notes (Signed)
Pt intubated at 515 by Dr. Leonides Schanz 7.5 ett 25 at the lip. Bilateral BS and yellow color change on ETCO2 detector. No complications noted.

## 2020-05-19 NOTE — Progress Notes (Signed)
Pt transported to 10M from the ED via ventilator. Pt remained stable throughout transport.

## 2020-05-19 NOTE — ED Provider Notes (Signed)
Patient seen and evaluated by Dr. Leonides Schanz and awaiting critical care consult for admission. Physical Exam  BP (!) 91/58   Pulse 80   Temp 98.4 F (36.9 C)   Resp 14   Ht 1.778 m (5\' 10" )   Wt 90.7 kg Comment: from 2019 records, please update  SpO2 98%   BMI 28.70 kg/m   Physical Exam  ED Course/Procedures     Procedures  MDM  59 yo male history of polysubstance abuse brought in today after found down at bus stop   Patient with agonal respirations and intubated in ED after receiving intranasal narcan without change in ms. BP borderline hypotensive Lactic elevated Broad spectrum abx and iv fluids initiated by Dr. Leonides Schanz Initial cxr after intubation with ett high but adjusted per rt DDX- seizure, stroke, OD, infection Awaiting critical care call back Discussed with Sallye Ober, NP on for critical care  Critical care has seen and evaluated and will admit     Pattricia Boss, MD 05/21/20 1323

## 2020-05-19 NOTE — ED Provider Notes (Addendum)
TIME SEEN: 5:39 AM  CHIEF COMPLAINT: Unresponsive  HPI: Patient is a 59 year old male with unknown past medical history who was found down at a bus stop.  Patient was found facedown in vomit.  Unclear how long he had been down.  Bystander called EMS but was not present when they arrive.  No history obtained.  Blood sugar in the 120s with EMS.  He is receiving assisted ventilation by BVM.  R NPA in place. No apnea.  In sinus rhythm.  Was never pulseless.  Received 2 mg of intranasal Narcan without change.  daughter, Verdell Face [phone]   ROS: Level 5 caveat secondary to altered mental status  PAST MEDICAL HISTORY/PAST SURGICAL HISTORY:  No past medical history on file.  MEDICATIONS:  Prior to Admission medications   Not on File    ALLERGIES:  Not on File  SOCIAL HISTORY:  Social History   Tobacco Use  . Smoking status: Not on file  Substance Use Topics  . Alcohol use: Not on file    FAMILY HISTORY: No family history on file.  EXAM: Pulse 91   Temp (!) 93 F (33.9 C) (Rectal)   Resp (!) 22   Ht 5\' 10"  (1.778 m)   Wt 90.7 kg Comment: from 2019 records, please update  SpO2 100%   BMI 28.70 kg/m  CONSTITUTIONAL: GCS 3.  Patient has agonal respirations.  Chronically ill-appearing. HEAD: Normocephalic, appears atraumatic EYES: Conjunctivae clear, pupils approximately 3 mm and sluggishly reactive bilaterally ENT: normal nose; brown-colored vomit around the mouth; NPA in the right nostril NECK: Supple, no midline step-off or deformity CARD: RRR; S1 and S2 appreciated; no murmurs, no clicks, no rubs, no gallops RESP: Agonal respirations.  Receiving assisted ventilation.  Rhonchorous breath sounds diffusely.  No wheezing or rales.  Hypoxic into the low 80s when not receiving assisted ventilation. ABD/GI: Soft.  Nondistended. BACK:  The back appears normal.  No step-off or deformity. EXT: No deformity noted.  No edema.  Extremities are cool to touch and mottled.   Palpable radial and DP pulses bilaterally. SKIN: Normal color for age and race; cold to touch, extremities mottled,; no rash on exposed skin NEURO: GCS initially 3 and then patient began to have some posturing of bilateral upper extremities.  MEDICAL DECISION MAKING: Patient here found unresponsive.  GCS 3.  Has vomit around his mouth and rhonchorous breath sounds.  I am concerned for aspiration.  He is not protecting his airway.  Patient intubated for airway protection and also hypoxic when not receiving assisted ventilation with BVM.  Differential includes electrolyte abnormality, sepsis, hypothermia, intracranial hemorrhage, stroke, intoxication, hepatic encephalopathy, seizure.  Patient brief episode of posturing of his bilateral upper extremities that stopped after 2 of Ativan.  Intubated without difficulty.  Placed on propofol for sedation and possible seizure prophylaxis.  Rectal temp 93.  Placed under Bair hugger and given warm fluids.  Labs, CT imaging pending.  Patient will need admission to critical care.  ED PROGRESS: It appears patient's endotracheal tube was pulled back accidentally prior to chest x-ray.  I have advanced endotracheal tube 5 cm.  Updated patient's son.  Patient has history of alcohol and heroin abuse, CVA, hypertension, carotid artery disease status post carotid enterectomy.  Please see MRN  [id number].  Son unaware of any meds.  States he lives with "some girl" and he doesn't have her name or number.   CT head shows no acute abnormality.  Suspect patient is intoxicated versus sz versus sepsis.  Will admit to CCM.  Received 30 mL/kg IV fluid bolus.  Will give broad-spectrum antibiotics given leukocytosis, elevated lactate.  He has had some drop in his blood pressures but this is likely secondary to sedation.  We will continue hydration.  Also started on Keppra for seizure prophylaxis given episode of posturing that improved with Ativan and elevated lactate.  This may be  alcohol withdrawal seizure.  Drug screen positive for opiates, cocaine.  Acetaminophen and salicylate levels are negative.  Ethanol level negative.  Ammonia level slightly elevated at 73.  TSH elevated.  We will add on free T3 and free T4.  I reviewed all nursing notes and pertinent previous records as available.  I have reviewed and interpreted any EKGs, lab and urine results, imaging (as available).    EKG Interpretation  Date/Time:  Saturday May 19 2020 05:13:33 EST Ventricular Rate:  85 PR Interval:    QRS Duration: 92 QT Interval:  414 QTC Calculation: 493 R Axis:   84 Text Interpretation: Sinus rhythm Borderline right axis deviation Borderline T wave abnormalities Borderline prolonged QT interval No old tracing to compare Confirmed by Pryor Curia [phone]) on 05/19/2020 5:39:18 AM       Procedure Name: Intubation Date/Time: 05/19/2020 5:30 AM Performed by: Chanoch Mccleery, Delice Bison, DO Pre-anesthesia Checklist: Patient identified, Patient being monitored, Emergency Drugs available, Timeout performed and Suction available Oxygen Delivery Method: Ambu bag Preoxygenation: Pre-oxygenation with 100% oxygen Induction Type: Rapid sequence Ventilation: Mask ventilation without difficulty Laryngoscope Size: Glidescope and 3 Grade View: Grade II Tube size: 7.5 mm Number of attempts: 1 Placement Confirmation: ETT inserted through vocal cords under direct vision,  CO2 detector and Breath sounds checked- equal and bilateral Secured at: 26 cm Tube secured with: ETT holder       CRITICAL CARE Performed by: Pryor Curia   Total critical care time: 65 minutes  Critical care time was exclusive of separately billable procedures and treating other patients.  Critical care was necessary to treat or prevent imminent or life-threatening deterioration.  Critical care was time spent personally by me on the following activities: development of treatment plan with patient and/or  surrogate as well as nursing, discussions with consultants, evaluation of patient's response to treatment, examination of patient, obtaining history from patient or surrogate, ordering and performing treatments and interventions, ordering and review of laboratory studies, ordering and review of radiographic studies, pulse oximetry and re-evaluation of patient's condition.   Davell Beckstead was evaluated in Emergency Department on 05/19/2020 for the symptoms described in the history of present illness. He was evaluated in the context of the global COVID-19 pandemic, which necessitated consideration that the patient might be at risk for infection with the SARS-CoV-2 virus that causes COVID-19. Institutional protocols and algorithms that pertain to the evaluation of patients at risk for COVID-19 are in a state of rapid change based on information released by regulatory bodies including the CDC and federal and state organizations. These policies and algorithms were followed during the patient's care in the ED.      Merly Hinkson, Delice Bison, DO 05/19/20 0756    Emmilia Sowder, Delice Bison, DO 05/19/20 9150

## 2020-05-19 NOTE — Progress Notes (Signed)
Decreased O2 from 100%, to 40%, and increased RR to 20 from 14 BPM per ABG results, NP at bedside and aware of vent changes.

## 2020-05-19 NOTE — Progress Notes (Signed)
Pt transported from  [facility] to CT and from CT back to [facility]. No complications noted

## 2020-05-20 ENCOUNTER — Inpatient Hospital Stay (HOSPITAL_COMMUNITY): Payer: No Typology Code available for payment source

## 2020-05-20 DIAGNOSIS — G9340 Encephalopathy, unspecified: Secondary | ICD-10-CM | POA: Diagnosis not present

## 2020-05-20 LAB — CBC
HCT: 37.9 % — ABNORMAL LOW (ref 39.0–52.0)
Hemoglobin: 12.7 g/dL — ABNORMAL LOW (ref 13.0–17.0)
MCH: 32.4 pg (ref 26.0–34.0)
MCHC: 33.5 g/dL (ref 30.0–36.0)
MCV: 96.7 fL (ref 80.0–100.0)
Platelets: 118 10*3/uL — ABNORMAL LOW (ref 150–400)
RBC: 3.92 MIL/uL — ABNORMAL LOW (ref 4.22–5.81)
RDW: 13.4 % (ref 11.5–15.5)
WBC: 9 10*3/uL (ref 4.0–10.5)
nRBC: 0 % (ref 0.0–0.2)

## 2020-05-20 LAB — GLUCOSE, CAPILLARY
Glucose-Capillary: 87 mg/dL (ref 70–99)
Glucose-Capillary: 96 mg/dL (ref 70–99)
Glucose-Capillary: 115 mg/dL — ABNORMAL HIGH (ref 70–99)
Glucose-Capillary: 105 mg/dL — ABNORMAL HIGH (ref 70–99)
Glucose-Capillary: 97 mg/dL (ref 70–99)
Glucose-Capillary: 96 mg/dL (ref 70–99)
Glucose-Capillary: 166 mg/dL — ABNORMAL HIGH (ref 70–99)

## 2020-05-20 LAB — BASIC METABOLIC PANEL
Anion gap: 8 (ref 5–15)
BUN: 16 mg/dL (ref 6–20)
CO2: 24 mmol/L (ref 22–32)
Calcium: 8.2 mg/dL — ABNORMAL LOW (ref 8.9–10.3)
Chloride: 108 mmol/L (ref 98–111)
Creatinine, Ser: 1.04 mg/dL (ref 0.61–1.24)
GFR, Estimated: >60 mL/min (ref >60)
Glucose, Bld: 113 mg/dL — ABNORMAL HIGH (ref 70–99)
Potassium: 3.6 mmol/L (ref 3.5–5.1)
Sodium: 140 mmol/L (ref 135–145)

## 2020-05-20 LAB — TRIGLYCERIDES: Triglycerides: 90 mg/dL (ref <150)

## 2020-05-20 LAB — PHOSPHORUS: Phosphorus: 3.2 mg/dL (ref 2.5–4.6)

## 2020-05-20 LAB — MAGNESIUM: Magnesium: 2 mg/dL (ref 1.7–2.4)

## 2020-05-20 LAB — AMMONIA: Ammonia: 28 umol/L (ref 9–35)

## 2020-05-20 LAB — T3, FREE: T3, Free: 4 pg/mL (ref 2.0–4.4)

## 2020-05-20 MED ORDER — LORAZEPAM 0.5 MG PO TABS: 1.0000 mg | ORAL_TABLET | ORAL | Status: DC | PRN

## 2020-05-20 MED ORDER — AMOXICILLIN-POT CLAVULANATE 875-125 MG PO TABS
1.0000 | ORAL_TABLET | Freq: Two times a day (BID) | ORAL | Status: DC
  Administered 2020-05-20 – 2020-05-22 (×4): 1 via ORAL
  Filled 2020-05-20 (×4): qty 1

## 2020-05-20 MED ORDER — THIAMINE HCL 100 MG/ML IJ SOLN
100.0000 mg | Freq: Every day | INTRAMUSCULAR | Status: DC
  Administered 2020-05-20: 100 mg via INTRAVENOUS
  Filled 2020-05-20: qty 2

## 2020-05-20 MED ORDER — ADULT MULTIVITAMIN W/MINERALS CH: 1.0000 | ORAL_TABLET | Freq: Every day | ORAL | Status: DC

## 2020-05-20 MED ORDER — THIAMINE HCL 100 MG PO TABS
100.0000 mg | ORAL_TABLET | Freq: Every day | ORAL | Status: DC
  Filled 2020-05-20: qty 1

## 2020-05-20 MED ORDER — DOCUSATE SODIUM 100 MG PO CAPS: 100.0000 mg | ORAL_CAPSULE | Freq: Two times a day (BID) | ORAL | Status: DC | PRN

## 2020-05-20 MED ORDER — LIDOCAINE 5 % EX PTCH
1.0000 | MEDICATED_PATCH | CUTANEOUS | Status: DC
  Administered 2020-05-20: 1 via TRANSDERMAL
  Filled 2020-05-20 (×2): qty 1

## 2020-05-20 MED ORDER — FOLIC ACID 1 MG PO TABS
1.0000 mg | ORAL_TABLET | Freq: Every day | ORAL | Status: DC
  Administered 2020-05-21: 1 mg via ORAL
  Filled 2020-05-20: qty 1

## 2020-05-20 MED ORDER — ACETAMINOPHEN 325 MG PO TABS
650.0000 mg | ORAL_TABLET | Freq: Four times a day (QID) | ORAL | Status: DC | PRN
  Administered 2020-05-21 (×2): 650 mg via ORAL
  Filled 2020-05-20 (×2): qty 2

## 2020-05-20 MED ORDER — ADULT MULTIVITAMIN W/MINERALS CH
1.0000 | ORAL_TABLET | Freq: Every day | ORAL | Status: DC
  Administered 2020-05-21: 1 via ORAL
  Filled 2020-05-20: qty 1

## 2020-05-20 MED ORDER — SODIUM CHLORIDE 0.9 % IV SOLN: 2.0000 g | INTRAVENOUS | Status: DC

## 2020-05-20 MED ORDER — LORAZEPAM 2 MG/ML IJ SOLN
1.0000 mg | INTRAMUSCULAR | Status: DC | PRN
  Administered 2020-05-20: 2 mg via INTRAVENOUS
  Administered 2020-05-20: 1 mg via INTRAVENOUS
  Administered 2020-05-21: 2 mg via INTRAVENOUS
  Filled 2020-05-20 (×3): qty 1

## 2020-05-20 MED ORDER — DOCUSATE SODIUM 100 MG PO CAPS
100.0000 mg | ORAL_CAPSULE | Freq: Two times a day (BID) | ORAL | Status: DC
  Administered 2020-05-20 – 2020-05-21 (×3): 100 mg via ORAL
  Filled 2020-05-20 (×4): qty 1

## 2020-05-20 MED ORDER — FOLIC ACID 1 MG PO TABS: 1.0000 mg | ORAL_TABLET | Freq: Every day | ORAL | Status: DC

## 2020-05-20 MED ORDER — POLYETHYLENE GLYCOL 3350 17 G PO PACK: 17.0000 g | PACK | Freq: Every day | ORAL | Status: DC | PRN

## 2020-05-20 NOTE — Evaluation (Signed)
Clinical/Bedside Swallow Evaluation Patient Details  Name: Brian Huynh MRN: [id number] Date of Birth: [birth year]  Today's Date: 05/20/2020 Time: SLP Start Time (ACUTE ONLY): 63 SLP Stop Time (ACUTE ONLY): 1018 SLP Time Calculation (min) (ACUTE ONLY): 11 min  Past Medical History:  Past Medical History:  Diagnosis Date   Anxiety    CAD (coronary artery disease)    Chronic back pain    CVA (cerebral vascular accident) ([place])    Hepatitis C    HTN (hypertension)    Polysubstance abuse ([place])    Past Surgical History:  Past Surgical History:  Procedure Laterality Date   APPENDECTOMY     ENDARTERECTOMY Left    HPI:  Pt  is a 59 y/o M admitted 11/27 to [facility] after being found down, altered at a bus stop. UDS positive for cocaine, opiates.  Pt hypothermic, intubated for airway protection with subsequent extubation on 11/28 at 8:11.  CXR 11/28: No evidence of acute cardiopulmonary disease. CT head 11/27: Multiple cortical and subcortical areas of hypodensity involving the high bilateral frontal lobes, which appear to be present on the prior from 01/26/2018.   Assessment / Plan / Recommendation Clinical Impression  Pt was seen for bedside swallow evaluation. He was alert throughout the evaluation but confused and became more agitated as the evaluation progressed. Pt was edentulous and stated that he owns dentures, but does not use them. Vocal quality was hoarse and pt stated that this is an acute change. Oral mechanism exam was limited due to pt's increasing resistance to participation, but strength and ROM appeared grossly WFL. Pt refused all p.o. trials despite education and encouragement. Pt became more agitated with additional efforts and began shouting, "No, no, no! Are you really a nurse or just playing around here?" Considering pt's already increasing agitation, SLP did not re-educate pt on her role. Diet recommendations will be deferred until pt allows assessment of p.o.  trials. SLP will continue to follow pt.  SLP Visit Diagnosis: Dysphagia, unspecified (R13.10)    Aspiration Risk       Diet Recommendation          Other  Recommendations Oral Care Recommendations: Oral care QID   Follow up Recommendations  (TBD)      Frequency and Duration min 2x/week  2 weeks       Prognosis Prognosis for Safe Diet Advancement: Good Barriers to Reach Goals: Cognitive deficits      Swallow Study   General Date of Onset: 05/19/20 HPI: Pt  is a 59 y/o M admitted 11/27 to [facility] after being found down, altered at a bus stop. UDS positive for cocaine, opiates.  Pt hypothermic, intubated for airway protection with subsequent extubation on 11/28 at 8:11.  CXR 11/28: No evidence of acute cardiopulmonary disease. CT head 11/27: Multiple cortical and subcortical areas of hypodensity involving the high bilateral frontal lobes, which appear to be present on the prior from 01/26/2018. Type of Study: Bedside Swallow Evaluation Previous Swallow Assessment: NOne Diet Prior to this Study: NPO Temperature Spikes Noted: No Respiratory Status: Room air History of Recent Intubation: Yes Length of Intubations (days): 1 days Date extubated: 05/20/20 Behavior/Cognition: Alert;Cooperative;Confused Oral Cavity Assessment: Within Functional Limits Oral Care Completed by SLP: No Oral Cavity - Dentition: Edentulous Vision: Functional for self-feeding Self-Feeding Abilities: Refused PO Patient Positioning: Upright in bed Baseline Vocal Quality: Hoarse Volitional Cough: Weak Volitional Swallow: Able to elicit    Oral/Motor/Sensory Function Overall Oral Motor/Sensory Function: Within functional limits (For limited)  Ice Chips Ice chips: Not tested (Pt refused)   Thin Liquid Thin Liquid: Not tested (Pt refused)    Nectar Thick Nectar Thick Liquid: Not tested (Pt refused)   Honey Thick Honey Thick Liquid: Not tested (Pt refused)   Puree Puree: Not tested (Pt refused)    Solid   Sylvana Bonk I. Hardin Negus, [place], [place] Office number [phone] Pager [phone] Solid: Not tested (Pt refused)      Horton Marshall 05/20/2020,10:43 AM

## 2020-05-20 NOTE — Procedures (Signed)
Extubation Procedure Note  Patient Details:   Name: Brian Huynh DOB: [birth year] MRN: [id number]   Airway Documentation:    Vent end date: 05/20/20 Vent end time: 0811   Evaluation  O2 sats: stable throughout Complications: No apparent complications Patient did tolerate procedure well. Bilateral Breath Sounds: Rhonchi   Patient extubated per MD order & placed on 4L [place]. Patient able to speak & has good strong cough post extubation.  Kathie Dike 05/20/2020, 8:18 AM

## 2020-05-20 NOTE — Progress Notes (Signed)
Pt transported via wheelchair to 25M 22 report given to [facility]

## 2020-05-20 NOTE — Progress Notes (Addendum)
05/20/2020  I saw and evaluated the patient. Discussed with resident and agree with resident's findings and plan as documented in the resident's note.  I have seen and evaluated the patient for respiratory failure.  S:  Extubated this AM. Confused and belligerent.  O: Blood pressure (!) 141/85, pulse 79, temperature 100 F (37.8 C), temperature source Bladder, resp. rate 20, height 5\' 10"  (1.778 m), weight 79.2 kg, SpO2 97 %.  No acute distress Trachea midline Temporal wasting Lungs clear Heart sounds regular Ext warm  A:  Acute hypoxemic respiratory failure secondary to likely drug overdose and possible aspiration pneumonitis- improved and weaned off ventilator this am  Ongoing metabolic encephalopathy question lingering sedation  Polysubstance abuse  Prior CVA  HTN  P:  - Wean O2 - IS, flutter - Swallow screen - CIWA monitoring, thiamine, folate - If okay this afternoon should be okay for floor transfer, appreciate TRH taking over - Not clear headed enough at present for substance abuse counseling    Patient critically ill due to respiratory failure Interventions to address this today ventilator wean and extubation Risk of deterioration without these interventions is high  I personally spent 35 minutes providing critical care not including any separately billable procedures  Erskine Emery MD [facility] 05/20/2020 10:07 AM Personal pager: [phone] If unanswered, please page CCM On-call: [phone]   05/20/2020 Erskine Emery MD      NAME:  Brian Huynh, MRN:  [id number], DOB:  02-Sep-1960, LOS: 1 ADMISSION DATE:  05/19/2020, CONSULTATION DATE:  11/27 REFERRING MD:  Dr. Jeanell Sparrow, CHIEF COMPLAINT:  AMS    Brief History   59 y/o M admitted 11/27 to [facility] after being found down, altered at a bus stop.  UDS positive for cocaine, opiates.  Pt hypothermic, intubated for airway protection.   History of present illness   See linked chart for  further information.     59 y/o M who presented to [facility] ER via EMS after being found down at a bus stop.   Per report, the patient was found face down in emesis.  Unknown downtime.  He was given intranasal Narcan without significant response.  NPA was placed and BVM required to assist ventilation.  On arrival to ER, the patient was intubated for airway protection. He was never pulseless.  Initial exam was reported as questionable posturing / altered / unresponsive.  CXR post intubation showed that the ETT was high and it was advanced, essentially clear.  CT of the head and neck were evaluated which were negative for acute process.  However, they did show old strokes.  ETOH level was negative. UDS was positive for cocaine and opiates.  The patient was hypothermic and hypotensive on arrival.  He was treated as possible sepsis and given 44ml/kg fluid resuscitation.  Additionally, he was given keppra and ativan for seizure (not witnessed, concern for withdrawal seizure in differential).  Pan cultures were obtained. Initial labs - Na 140, K 5.3, glucose 308, BUN 37, Sr Cr 1.60, ammonia 73, troponin 36, lactic acid 1.9, WBC 14.8, Hgb 16.2 and platelets 256.  He was sedated with propofol in the ER.   PCCM called for ICU admission.   Past Medical History  Polysubstance abuse - ETOH, heroin, cocaine, tobacco  CVA HTN   Significant Hospital Events   11/27 Admit with AMS  Consults:    Procedures:  ETT 11/27 >> 11/28  Significant Diagnostic Tests:   CT Head 11/27 >> no acute process, multiple areas  of possible old stroke   CT Neck 11/27 >> negative   UDS 11/27 >> positive for cocaine, opiates   Micro Data:  COVID / Flu 11/27 >> negative  UA 11/27 >> negative  BCx2 11/27 >>  Tracheal Aspirate 11/27 >> G+ cocci & G+ rods  Antimicrobials:  Cefepime 11/27 x1  Vanco 11/27 x1  Unasyn 11/27 (empiric aspiration coverage) >> 11/28 Ceftriaxone 11/28 >>   Interim history/subjective:  Extubated,  coughing up phlegm, following commands and answering questions. He states he does not drink but used to.   Objective   Blood pressure 123/73, pulse 76, temperature 99.7 F (37.6 C), resp. rate 20, height 5\' 10"  (1.778 m), weight 79.2 kg, SpO2 98 %.    Vent Mode: PRVC FiO2 (%):  [30 %-40 %] 30 % Set Rate:  [20 bmp] 20 bmp Vt Set:  [580 mL] 580 mL PEEP:  [5 cmH20] 5 cmH20 Plateau Pressure:  [13 cmH20-16 cmH20] 14 cmH20   Intake/Output Summary (Last 24 hours) at 05/20/2020 0659 Last data filed at 05/20/2020 6168 Gross per 24 hour  Intake 6924.24 ml  Output 2712 ml  Net 4212.24 ml   Filed Weights   05/19/20 1022 05/20/20 0030 05/20/20 0148  Weight: 80.6 kg 79.2 kg 79.2 kg    Examination: General: sitting up in bed, NAD  HEENT: MM pink/moist, ETT, eom intact, [place]/AT Neuro: alert, following commands, orientedx3  CV: s1s2 RRR, no m/r/g PULM: rhonchi bilaterally, otherwise CTA  GI: soft, bsx4 active, NTTP  Extremities: warm/dry, no edema  Skin: multiple tattoos, dry ulceration on right great toe  Resolved Hospital Problem list   Acute Metabolic Encephalopathy AKI Hyperammonemia    Assessment & Plan:   Acute Metabolic Encephalopathy  Suspected polysubstance abuse / overdose. Positive for cocaine and opiates. Resolved. Concern for possible withdrawal seizure but EEG not done, now alert & oriented. .  -seizure precautions  - remove cervical collar, c-spine/soft tissue cleared  -follow frequent neuro exam  -hold further keppra for now as no witnessed seizure  - discuss EtOh use   Acute Respiratory Failure secondary to Encephalopathy Aspiration Pneumonia   Extubated to [place]. Breathing well, has cough. Concern for aspiration, cultures growing G+ rods and G+ cocci. HIV negative.  - f/u cultures - switch to ceftriaxone 7 days total  - PT, SLP   AKI -Resolved, stop LR fluids   Hyperammonemia  -Resolved. Stop lactulose   Coffee Ground Emesis  Hemoglobin stable. May need  outpatient GI f/u  -cont. PPI   Hyperglycemia  -SSI, sensitive scale   Hepatitis C -supportive care, unclear if ever treated   Hx HTN, CAD -hold home antihypertensives   Chronic Back Pain Unchanged from usual per patient.  -tylenol prn -lidocaine   Best practice (evaluated daily)   Diet: NPO  Pain/Anxiety/Delirium protocol (if indicated): PAD protocol  VAP protocol (if indicated): in place  DVT prophylaxis: heparin SQ  GI prophylaxis: PPI  Glucose control: SSI Mobility: BR, progress as tolerated  Last date of multidisciplinary goals of care discussion: Pending, updated per EDP 11/27. Daughter - Verdell Face [phone]  Family and staff present: pending  Summary of discussion: pending  Follow up goals of care discussion due: 12/4.   Code Status: Full Code  Disposition: ICU   Labs   CBC: Recent Labs  Lab 05/19/20 0515 05/19/20 0521 05/19/20 0810  WBC 14.8*  --   --   HGB 16.2 17.7* 12.9*  HCT 52.1* 52.0 38.0*  MCV 104.2*  --   --  PLT 256  --   --     Basic Metabolic Panel: Recent Labs  Lab 05/19/20 0515 05/19/20 0521 05/19/20 0725 05/19/20 0810  NA  --  140 141 144  K  --  5.3* 4.2 4.0  CL  --  102 106  --   CO2  --   --  24  --   GLUCOSE  --  308* 88  --   BUN  --  37* 23*  --   CREATININE  --  1.60* 1.37*  --   CALCIUM  --   --  7.6*  --   MG 3.0*  --   --   --    GFR: Estimated Creatinine Clearance: 59.2 mL/min (A) (by C-G formula based on SCr of 1.37 mg/dL (H)). Recent Labs  Lab 05/19/20 0515 05/19/20 0725  WBC 14.8*  --   LATICACIDVEN 5.9* 1.9    Liver Function Tests: Recent Labs  Lab 05/19/20 0725  AST 37  ALT 39  ALKPHOS 60  BILITOT 0.8  PROT 6.2*  ALBUMIN 2.8*   No results for input(s): LIPASE, AMYLASE in the last 168 hours. Recent Labs  Lab 05/19/20 0546  AMMONIA 73*    ABG    Component Value Date/Time   PHART 7.342 (L) 05/19/2020 0810   PCO2ART 57.2 (H) 05/19/2020 0810   PO2ART 487 (H) 05/19/2020 0810    HCO3 31.0 (H) 05/19/2020 0810   TCO2 33 (H) 05/19/2020 0810   O2SAT 100.0 05/19/2020 0810     Coagulation Profile: No results for input(s): INR, PROTIME in the last 168 hours.  Cardiac Enzymes: Recent Labs  Lab 05/19/20 0725  CKTOTAL 111    HbA1C: Hgb A1c MFr Bld  Date/Time Value Ref Range Status  05/19/2020 08:50 AM 5.0 4.8 - 5.6 % Final    Comment:    RESULTS CONFIRMED BY MANUAL DILUTION (NOTE) Pre diabetes:          5.7%-6.4%  Diabetes:              >6.4%  Glycemic control for   <7.0% adults with diabetes     CBG: Recent Labs  Lab 05/19/20 1536 05/19/20 1955 05/19/20 2332 05/20/20 0155 05/20/20 0327  GLUCAP 103* 92 87 87 96    Seawell, Jaimie A, DO 05/20/2020, 6:59 AM Pager: [phone]

## 2020-05-20 NOTE — Progress Notes (Addendum)
  Speech Language Pathology Treatment: Dysphagia  Patient Details Name: Brian Huynh MRN: [id number] DOB: [birth year] Today's Date: 05/20/2020 Time: 1340-1350 SLP Time Calculation (min) (ACUTE ONLY): 10 min  Assessment / Plan / Recommendation Clinical Impression  Pt was seen for dysphagia treatment following page from RN who reported that the pt's brother is at bedside and that the pt is now more cooperative. He was given Ativan since the evaluation this morning and he was significantly more cooperative during this session, but exhibited some difficulty maintaining alertness for prolonged periods of time. He exhibited throat clearing with 2/5 boluses of puree solids and with thin 3/7 boluses of thin liquids liquids, but no s/sx of aspiration were noted with regular texture solids. Mastication was [facility] and no significant oral residue was noted. The impact of Ativan on his performance is considered. A regular texture diet with thin liquids is recommended at this time and SLP will follow pt briefly.    HPI HPI: Pt  is a 59 y/o M admitted 11/27 to [facility] after being found down, altered at a bus stop. UDS positive for cocaine, opiates.  Pt hypothermic, intubated for airway protection with subsequent extubation on 11/28 at 8:11.  CXR 11/28: No evidence of acute cardiopulmonary disease. CT head 11/27: Multiple cortical and subcortical areas of hypodensity involving the high bilateral frontal lobes, which appear to be present on the prior from 01/26/2018.      SLP Plan  Continue with current plan of care       Recommendations  Diet recommendations: Regular;Thin liquid Liquids provided via: Cup;Straw Medication Administration: Whole meds with puree Supervision: Staff to assist with self feeding Compensations: Slow rate;Small sips/bites Postural Changes and/or Swallow Maneuvers: Seated upright 90 degrees                Oral Care Recommendations: Oral care QID Follow up Recommendations:   (TBD) SLP Visit Diagnosis: Dysphagia, unspecified (R13.10) Plan: Continue with current plan of care       Shabree Tebbetts I. Hardin Negus, [place], [place] Office number [phone] Pager [place] 05/20/2020, 1:59 PM      .

## 2020-05-21 DIAGNOSIS — G9340 Encephalopathy, unspecified: Secondary | ICD-10-CM | POA: Diagnosis not present

## 2020-05-21 DIAGNOSIS — F1910 Other psychoactive substance abuse, uncomplicated: Secondary | ICD-10-CM

## 2020-05-21 DIAGNOSIS — J9601 Acute respiratory failure with hypoxia: Secondary | ICD-10-CM | POA: Diagnosis not present

## 2020-05-21 DIAGNOSIS — R4182 Altered mental status, unspecified: Secondary | ICD-10-CM

## 2020-05-21 LAB — CULTURE, RESPIRATORY W GRAM STAIN: Culture: NORMAL

## 2020-05-21 LAB — CBC
HCT: 40.4 % (ref 39.0–52.0)
Hemoglobin: 13.4 g/dL (ref 13.0–17.0)
MCH: 32.3 pg (ref 26.0–34.0)
MCHC: 33.2 g/dL (ref 30.0–36.0)
MCV: 97.3 fL (ref 80.0–100.0)
Platelets: 116 10*3/uL — ABNORMAL LOW (ref 150–400)
RBC: 4.15 MIL/uL — ABNORMAL LOW (ref 4.22–5.81)
RDW: 12.8 % (ref 11.5–15.5)
WBC: 6.7 10*3/uL (ref 4.0–10.5)
nRBC: 0 % (ref 0.0–0.2)

## 2020-05-21 LAB — GLUCOSE, CAPILLARY
Glucose-Capillary: 113 mg/dL — ABNORMAL HIGH (ref 70–99)
Glucose-Capillary: 86 mg/dL (ref 70–99)
Glucose-Capillary: 81 mg/dL (ref 70–99)
Glucose-Capillary: 76 mg/dL (ref 70–99)
Glucose-Capillary: 92 mg/dL (ref 70–99)
Glucose-Capillary: 96 mg/dL (ref 70–99)
Glucose-Capillary: 123 mg/dL — ABNORMAL HIGH (ref 70–99)

## 2020-05-21 LAB — BASIC METABOLIC PANEL
Anion gap: 10 (ref 5–15)
BUN: 14 mg/dL (ref 6–20)
CO2: 25 mmol/L (ref 22–32)
Calcium: 8.4 mg/dL — ABNORMAL LOW (ref 8.9–10.3)
Chloride: 106 mmol/L (ref 98–111)
Creatinine, Ser: 1.14 mg/dL (ref 0.61–1.24)
GFR, Estimated: >60 mL/min (ref >60)
Glucose, Bld: 130 mg/dL — ABNORMAL HIGH (ref 70–99)
Potassium: 3.7 mmol/L (ref 3.5–5.1)
Sodium: 141 mmol/L (ref 135–145)

## 2020-05-21 LAB — MAGNESIUM: Magnesium: 2.1 mg/dL (ref 1.7–2.4)

## 2020-05-21 MED ORDER — GABAPENTIN 100 MG PO CAPS
200.0000 mg | ORAL_CAPSULE | Freq: Two times a day (BID) | ORAL | Status: DC
  Administered 2020-05-21: 200 mg via ORAL
  Filled 2020-05-21: qty 2

## 2020-05-21 MED ORDER — ASPIRIN 81 MG PO CHEW
81.0000 mg | CHEWABLE_TABLET | Freq: Every day | ORAL | Status: DC
  Administered 2020-05-21: 81 mg via ORAL
  Filled 2020-05-21: qty 1

## 2020-05-21 MED ORDER — CHLORHEXIDINE GLUCONATE 0.12 % MT SOLN
OROMUCOSAL | Status: AC
  Filled 2020-05-21: qty 15

## 2020-05-21 MED ORDER — CLOPIDOGREL BISULFATE 75 MG PO TABS: 75.0000 mg | ORAL_TABLET | Freq: Every day | ORAL | Status: DC

## 2020-05-21 MED ORDER — ATORVASTATIN CALCIUM 10 MG PO TABS
20.0000 mg | ORAL_TABLET | Freq: Every day | ORAL | Status: DC
  Administered 2020-05-21: 20 mg via ORAL
  Filled 2020-05-21: qty 2

## 2020-05-21 NOTE — Evaluation (Signed)
Occupational Therapy Evaluation Patient Details Name: Brian Huynh MRN: [id number] DOB: 11/08/1960 Today's Date: 05/21/2020    History of Present Illness Pt is a 59 y/o male presenting to [facility] ER after being found down, altered at bus stop.  UDS + for cocaine, opiates. ETT 11/27-28. CT head/neck negative for acute abnormalities. Admitted with acute metabolic encephalopathy.   Clinical Impression   PTA patient reports independent. Limited historian and easily agitated, therefore unsure of home setup or assist available.  Patient confused, oriented to self, partially to time, and place with cueing but not situation; repeatedly reports being "locked up" after re-orientation. Patient with decreased STM, problem solving, awareness and safety.  He requires min assist for bed mobility, min assist for transfers, min-mod assist for functional mobility, and up to min assist for ADLs.  Believe he will benefit from continued OT services while admitted and after dc at SNF level to optimize independence, safety and return to PLOF with ADLs, mobility.      Follow Up Recommendations  SNF;Supervision/Assistance - 24 hour    Equipment Recommendations  3 in 1 bedside commode    Recommendations for Other Services       Precautions / Restrictions Precautions Precautions: Fall Restrictions Weight Bearing Restrictions: No      Mobility Bed Mobility Overal bed mobility: Needs Assistance Bed Mobility: Supine to Sit     Supine to sit: Min assist     General bed mobility comments: min assist for trunk support to EOB     Transfers Overall transfer level: Needs assistance Equipment used: None Transfers: Sit to/from Stand Sit to Stand: Min assist         General transfer comment: pt with multiple attempts to stand to EOB, min assist to fully power up and steady     Balance Overall balance assessment: Needs assistance Sitting-balance support: No upper extremity supported;Feet  supported Sitting balance-Leahy Scale: Fair     Standing balance support: Single extremity supported;During functional activity Standing balance-Leahy Scale: Poor Standing balance comment: relies on external support, reaching out for UE support                            ADL either performed or assessed with clinical judgement   ADL Overall ADL's : Needs assistance/impaired     Grooming: Min guard;Standing;Wash/dry hands   Upper Body Bathing: Set up;Sitting   Lower Body Bathing: Sit to/from stand;Minimal assistance   Upper Body Dressing : Set up;Sitting   Lower Body Dressing: Minimal assistance;Sit to/from stand Lower Body Dressing Details (indicate cue type and reason): able to don socks with increased time, min assist sit to stand  Toilet Transfer: Minimal assistance;Moderate assistance;Ambulation Toilet Transfer Details (indicate cue type and reason): simulated in room to recliner          Functional mobility during ADLs: Minimal assistance;Moderate assistance General ADL Comments: pt limited by cognition, weakness and balance      Vision   Vision Assessment?: No apparent visual deficits     Perception     Praxis      Pertinent Vitals/Pain Pain Assessment: No/denies pain     Hand Dominance     Extremity/Trunk Assessment Upper Extremity Assessment Upper Extremity Assessment: Generalized weakness   Lower Extremity Assessment Lower Extremity Assessment: Defer to PT evaluation       Communication Communication Communication: No difficulties   Cognition Arousal/Alertness: Awake/alert Behavior During Therapy: Agitated Overall Cognitive Status: Impaired/Different from baseline Area  of Impairment: Orientation;Attention;Memory;Following commands;Safety/judgement;Awareness;Problem solving                 Orientation Level: Disoriented to;Time;Situation (able to voice hospital with cueing to "look around") Current Attention Level:  Sustained Memory: Decreased short-term memory Following Commands: Follows one step commands consistently;Follows one step commands with increased time;Follows multi-step commands inconsistently Safety/Judgement: Decreased awareness of safety;Decreased awareness of deficits Awareness: Intellectual Problem Solving: Slow processing;Decreased initiation;Difficulty sequencing;Requires verbal cues General Comments: pt disoriented to situation and time (able to state nov 2021 but not day), oriented to place with cueing but consistently reports being "locked up"; follows 1 step commands with increased time; poor awareness to deficits, situation and very easily agitated    General Comments  RN notified of IV pulled out upon entry     Exercises     Shoulder Instructions      Home Living Family/patient expects to be discharged to:: Private residence Living Arrangements: Alone                               Additional Comments: pt limited historian, agitated with questions--further information needed      Prior Functioning/Environment Level of Independence: Independent        Comments: reports independent        OT Problem List: Decreased strength;Decreased activity tolerance;Impaired balance (sitting and/or standing);Decreased cognition;Decreased safety awareness;Decreased knowledge of use of DME or AE      OT Treatment/Interventions: Self-care/ADL training;DME and/or AE instruction;Therapeutic activities;Cognitive remediation/compensation;Balance training;Patient/family education    OT Goals(Current goals can be found in the care plan section) Acute Rehab OT Goals Patient Stated Goal: to get out of here OT Goal Formulation: With patient Time For Goal Achievement: 06/04/20 Potential to Achieve Goals: Fair  OT Frequency: Min 2X/week   Barriers to D/C:            Co-evaluation PT/OT/SLP Co-Evaluation/Treatment: Yes Reason for Co-Treatment: For patient/therapist  safety;To address functional/ADL transfers;Necessary to address cognition/behavior during functional activity   OT goals addressed during session: ADL's and self-care      AM-PAC OT "6 Clicks" Daily Activity     Outcome Measure Help from another person eating meals?: A Little (liquids) Help from another person taking care of personal grooming?: A Little Help from another person toileting, which includes using toliet, bedpan, or urinal?: A Lot Help from another person bathing (including washing, rinsing, drying)?: A Little Help from another person to put on and taking off regular upper body clothing?: A Little Help from another person to put on and taking off regular lower body clothing?: A Little 6 Click Score: 17   End of Session Equipment Utilized During Treatment: Gait belt Nurse Communication: Mobility status;Precautions  Activity Tolerance: Treatment limited secondary to agitation Patient left: in chair;with call bell/phone within reach;with chair alarm set  OT Visit Diagnosis: Other abnormalities of gait and mobility (R26.89);Muscle weakness (generalized) (M62.81);Other symptoms and signs involving cognitive function                Time: 4970-2637 OT Time Calculation (min): 27 min Charges:  OT General Charges $OT Visit: 1 Visit OT Evaluation $OT Eval Moderate Complexity: 1 Mod  Jolaine Artist, OT Acute Rehabilitation Services Pager [phone] Office [phone]   Delight Stare 05/21/2020, 12:32 PM

## 2020-05-21 NOTE — Progress Notes (Signed)
PROGRESS NOTE    Brian Huynh  QMG:[id number] DOB: 09/15/1960 DOA: 05/19/2020 PCP: [facility]  Brief Narrative: 59 year old male with history of polysubstance abuse, history of CVA, bilateral carotid endarterectomies, heroin use was found down at a bus stop in his vomitus, EMS was called, brought to the ED given Narcan, subsequently was intubated for airway protection.  Alcohol level was negative, UDS was positive for cocaine and opiates, patient is hypothermic and hypotensive on arrival, he was also treated with broad-spectrum antibiotics and given Keppra and Ativan in the ED. -Intubated admitted to the ICU, started antibiotics for aspiration pneumonia -Subsequently extubated 11/28 -Transferred to [facility] service today 11/29, patient is a little agitated, partly confused  Assessment & Plan:   Acute metabolic encephalopathy -Etiology remains unclear, found down in vomitus at a bus stop,  overdose versus withdrawal suspected, UDS positive for cocaine and opiates, seen in the ED 4 months ago with heroin overdose, FYI Pt has another MRN -CT head suggestive of prior strokes unchanged from 2019unremarkable, ammonia level was transiently elevated on admission, repeats have been normal -Also treated for aspiration pneumonia in the ICU -Mental status slowly improving, still with some confusion, continue CIWA protocol -PT OT eval  Acute respiratory failure secondary to encephalopathy Aspiration pneumonia -Rx w/ IV ceftriaxone, now on oral augmentin -Sputum cultures are polymicrobial, blood cx are negative -weaned off O2  Acute kidney injury -Resolved  Chronic hep C -Follow-up in ID clinic  History of CAD Hypertension History of CVA, bilateral carotid endarterectomies -Restart aspirin, hold Plavix and statin  Trace hemetemesis -while in ICU, resolved, Hb stable -continue PPI, hold plavix, monitor  DVT prophylaxis:hep SQ Code Status: FUll Code Family Communication:none  at bedside Disposition Plan:  Status is: Inpatient  Remains inpatient appropriate because:Inpatient level of care appropriate due to severity of illness   Dispo: The patient is from: Home              Anticipated d/c is to: Home              Anticipated d/c date is: 1 day              Patient currently is not medically stable to d/c.  Consultants:   PCCM Tx   Procedures:   Antimicrobials:    Subjective: -Wondering how he ended up in the hospital  Objective: Vitals:   05/20/20 2150 05/21/20 0452 05/21/20 0457 05/21/20 0903  BP: (!) 171/80 (!) 123/104  (!) 153/92  Pulse: 81 76  76  Resp: _0 Temp: 99.9 F (37.7 C) 99.5 F (37.5 C)  98.1 F (36.7 C)  TempSrc: Oral Oral    SpO2: 92% 95%  96%  Weight:   79.6 kg   Height:        Intake/Output Summary (Last 24 hours) at 05/21/2020 1442 Last data filed at 05/21/2020 1400 Gross per 24 hour  Intake 1090.83 ml  Output 2675 ml  Net -1584.17 ml   Filed Weights   05/20/20 0030 05/20/20 0148 05/21/20 0457  Weight: 79.2 kg 79.2 kg 79.6 kg    Examination:  General exam: Awake alert chronically ill male sitting up in bed, oriented to self and partly to place, mild confusion, appears restless CVS: S1-S2, regular rate rhythm  lungs: Decreased breath sounds to bases Abdomen abdomen: Soft, nontender, bowel sounds present Extremities: No edema Neuro: Moves all extremities, no localizing signs Skin: No rashes on exposed skin Psychiatry: Poor insight and judgment  Data Reviewed:   CBC: Recent Labs  Lab 05/19/20 0515 05/19/20 0521 05/19/20 0810 05/20/20 0705 05/21/20 0426  WBC 14.8*  --   --  9.0 6.7  HGB 16.2 17.7* 12.9* 12.7* 13.4  HCT 52.1* 52.0 38.0* 37.9* 40.4  MCV 104.2*  --   --  96.7 97.3  PLT 256  --   --  118* 865*   Basic Metabolic Panel: Recent Labs  Lab 05/19/20 0515 05/19/20 0521 05/19/20 0725 05/19/20 0810 05/20/20 0705 05/21/20 0426  NA  --  140 141 144 140 141  K  --  5.3*  4.2 4.0 3.6 3.7  CL  --  102 106  --  108 106  CO2  --   --  24  --  24 25  GLUCOSE  --  308* 88  --  113* 130*  BUN  --  37* 23*  --  16 14  CREATININE  --  1.60* 1.37*  --  1.04 1.14  CALCIUM  --   --  7.6*  --  8.2* 8.4*  MG 3.0*  --   --   --  2.0 2.1  PHOS  --   --   --   --  3.2  --    GFR: Estimated Creatinine Clearance: 71.2 mL/min (by C-G formula based on SCr of 1.14 mg/dL). Liver Function Tests: Recent Labs  Lab 05/19/20 0725  AST 37  ALT 39  ALKPHOS 60  BILITOT 0.8  PROT 6.2*  ALBUMIN 2.8*   No results for input(s): LIPASE, AMYLASE in the last 168 hours. Recent Labs  Lab 05/19/20 0546 05/20/20 0705  AMMONIA 73* 28   Coagulation Profile: No results for input(s): INR, PROTIME in the last 168 hours. Cardiac Enzymes: Recent Labs  Lab 05/19/20 0725  CKTOTAL 111   BNP (last 3 results) No results for input(s): PROBNP in the last 8760 hours. HbA1C: Recent Labs    05/19/20 0850  HGBA1C 5.0   CBG: Recent Labs  Lab 05/20/20 2029 05/21/20 0004 05/21/20 0450 05/21/20 0703 05/21/20 1123  GLUCAP 166* 86 113* 81 76   Lipid Profile: Recent Labs    05/20/20 0705  TRIG 90   Thyroid Function Tests: Recent Labs    05/19/20 0515 05/19/20 0725  TSH 8.402*  --   FREET4  --  1.03  T3FREE  --  4.0   Anemia Panel: No results for input(s): VITAMINB12, FOLATE, FERRITIN, TIBC, IRON, RETICCTPCT in the last 72 hours. Urine analysis:    Component Value Date/Time   COLORURINE YELLOW 05/19/2020 0932   APPEARANCEUR CLEAR 05/19/2020 0932   LABSPEC 1.015 05/19/2020 0932   PHURINE 5.0 05/19/2020 0932   GLUCOSEU 50 (A) 05/19/2020 0932   HGBUR NEGATIVE 05/19/2020 0932   BILIRUBINUR NEGATIVE 05/19/2020 0932   KETONESUR NEGATIVE 05/19/2020 0932   PROTEINUR NEGATIVE 05/19/2020 0932   NITRITE NEGATIVE 05/19/2020 0932   LEUKOCYTESUR NEGATIVE 05/19/2020 0932   Sepsis Labs: _0 (procalcitonin:4,lacticidven:4)  ) Recent Results (from the past 240 hour(s))   Resp Panel by RT-PCR (Flu A&B, Covid) Nasopharyngeal Swab     Status: None   Collection Time: 05/19/20  5:47 AM   Specimen: Nasopharyngeal Swab; Nasopharyngeal(NP) swabs in vial transport medium  Result Value Ref Range Status   SARS Coronavirus 2 by RT PCR NEGATIVE NEGATIVE Final    Comment: (NOTE) SARS-CoV-2 target nucleic acids are NOT DETECTED.  The SARS-CoV-2 RNA is generally detectable in upper respiratory specimens during the acute phase of infection. The lowest  concentration of SARS-CoV-2 viral copies this assay can detect is 138 copies/mL. A negative result does not preclude SARS-Cov-2 infection and should not be used as the sole basis for treatment or other patient management decisions. A negative result may occur with  improper specimen collection/handling, submission of specimen other than nasopharyngeal swab, presence of viral mutation(s) within the areas targeted by this assay, and inadequate number of viral copies(<138 copies/mL). A negative result must be combined with clinical observations, patient history, and epidemiological information. The expected result is Negative.  Fact Sheet for Patients:  EntrepreneurPulse.com.au  Fact Sheet for Healthcare Providers:  IncredibleEmployment.be  This test is no t yet approved or cleared by the [place] FDA and  has been authorized for detection and/or diagnosis of SARS-CoV-2 by FDA under an Emergency Use Authorization (EUA). This EUA will remain  in effect (meaning this test can be used) for the duration of the COVID-19 declaration under Section 564(b)(1) of the Act, 21 U.S.C.section 360bbb-3(b)(1), unless the authorization is terminated  or revoked sooner.       Influenza A by PCR NEGATIVE NEGATIVE Final   Influenza B by PCR NEGATIVE NEGATIVE Final    Comment: (NOTE) The Xpert Xpress SARS-CoV-2/FLU/RSV plus assay is intended as an aid in the diagnosis of influenza from  Nasopharyngeal swab specimens and should not be used as a sole basis for treatment. Nasal washings and aspirates are unacceptable for Xpert Xpress SARS-CoV-2/FLU/RSV testing.  Fact Sheet for Patients: EntrepreneurPulse.com.au  Fact Sheet for Healthcare Providers: IncredibleEmployment.be  This test is not yet approved or cleared by the [place] FDA and has been authorized for detection and/or diagnosis of SARS-CoV-2 by FDA under an Emergency Use Authorization (EUA). This EUA will remain in effect (meaning this test can be used) for the duration of the COVID-19 declaration under Section 564(b)(1) of the Act, 21 U.S.C. section 360bbb-3(b)(1), unless the authorization is terminated or revoked.  Performed at [facility], [place] [address], [place], [place] [address]   Blood culture (routine x 2)     Status: None (Preliminary result)   Collection Time: 05/19/20  6:09 AM   Specimen: BLOOD  Result Value Ref Range Status   Specimen Description BLOOD RIGHT ANTECUBITAL  Final   Special Requests   Final    BOTTLES DRAWN AEROBIC ONLY Blood Culture adequate volume   Culture   Final    NO GROWTH 2 DAYS Performed at [facility], [place] [address], [place], [place] [address]    Report Status PENDING  Incomplete  Blood culture (routine x 2)     Status: None (Preliminary result)   Collection Time: 05/19/20  6:45 AM   Specimen: BLOOD RIGHT HAND  Result Value Ref Range Status   Specimen Description BLOOD RIGHT HAND  Final   Special Requests   Final    BOTTLES DRAWN AEROBIC AND ANAEROBIC Blood Culture adequate volume   Culture   Final    NO GROWTH 2 DAYS Performed at [facility], [place] [address], [place], [place] [address]    Report Status PENDING  Incomplete  Culture, respiratory (tracheal aspirate)     Status: None   Collection Time: 05/19/20  8:27 AM   Specimen: Tracheal Aspirate; Respiratory  Result Value Ref Range Status    Specimen Description TRACHEAL ASPIRATE  Final   Special Requests NONE  Final   Gram Stain   Final    MODERATE WBC PRESENT,BOTH PMN AND MONONUCLEAR MODERATE GRAM POSITIVE COCCI IN  PAIRS MODERATE GRAM POSITIVE RODS    Culture   Final    RARE Normal respiratory flora-no Staph aureus or Pseudomonas seen Performed at [facility] [address] [place], [place] [address]    Report Status 05/21/2020 FINAL  Final  MRSA PCR Screening     Status: None   Collection Time: 05/19/20 11:46 AM   Specimen: Nasal Mucosa; Nasopharyngeal  Result Value Ref Range Status   MRSA by PCR NEGATIVE NEGATIVE Final    Comment:        The GeneXpert MRSA Assay (FDA approved for NASAL specimens only), is one component of a comprehensive MRSA colonization surveillance program. It is not intended to diagnose MRSA infection nor to guide or monitor treatment for MRSA infections. Performed at [facility], [place] [address], [place], [place] [address]          Radiology Studies: DG Chest Port 1 View  Result Date: 05/20/2020 CLINICAL DATA:  Cephalopathy.  Found down. EXAM: PORTABLE CHEST 1 VIEW COMPARISON:  May 19, 2020. FINDINGS: Endotracheal tube tip projects approximately 3.9 cm above the carina. Gastric tube courses below the diaphragm with the tip projecting at the expected region of the stomach. Similar cardiomediastinal silhouette. No focal consolidation. No visible pleural effusions or pneumothorax. No acute osseous abnormality. IMPRESSION: 1. Endotracheal tube tip projects approximately 3.9 cm above the carina. 2. No evidence of acute cardiopulmonary disease. Electronically Signed   By: Margaretha Sheffield MD   On: 05/20/2020 07:34        Scheduled Meds: . amoxicillin-clavulanate  1 tablet Oral Q12H  . chlorhexidine gluconate (MEDLINE KIT)  15 mL Mouth Rinse BID  . Chlorhexidine Gluconate Cloth  6 each Topical Daily  . docusate sodium  100 mg Oral BID  . folic acid  1 mg Oral  Daily  . heparin  5,000 Units Subcutaneous Q8H  . insulin aspart  0-9 Units Subcutaneous Q4H  . lidocaine  1 patch Transdermal Q24H  . multivitamin with minerals  1 tablet Oral Daily  . thiamine  100 mg Oral Daily   Or  . thiamine  100 mg Intravenous Daily   Continuous Infusions: . sodium chloride Stopped (05/20/20 2136)     LOS: 2 days    Time spent:67mn  PDomenic Polite MD [facility]  05/21/2020, 2:42 PM

## 2020-05-21 NOTE — Evaluation (Signed)
Physical Therapy Evaluation Patient Details Name: Brian Huynh MRN: [id number] DOB: August 17, 1960 Today's Date: 05/21/2020   History of Present Illness  Pt is a 59 y/o male presenting to [facility] ER after being found down, altered at bus stop.  UDS + for cocaine, opiates. ETT 11/27-28. CT head/neck negative for acute abnormalities. Admitted with acute metabolic encephalopathy.  Clinical Impression  Patient presents with decreased mobility due to decreased balance, decreased strength, decreased safety awareness and cognitive changes.  Currently needs mod A for ambulation with HHA (may do better with RW,) however not safe to return to independent with unknown support system.  Feel he may need STSNF prior to d/c if he will agree.  PT to continue to follow acutely.     Follow Up Recommendations SNF;Supervision/Assistance - 24 hour    Equipment Recommendations  Rolling walker with 5" wheels    Recommendations for Other Services       Precautions / Restrictions Precautions Precautions: Fall Restrictions Weight Bearing Restrictions: No      Mobility  Bed Mobility Overal bed mobility: Needs Assistance Bed Mobility: Supine to Sit     Supine to sit: Min assist     General bed mobility comments: min assist for trunk support to EOB     Transfers Overall transfer level: Needs assistance Equipment used: None Transfers: Sit to/from Stand Sit to Stand: Min assist         General transfer comment: pt with multiple attempts to stand to EOB, min assist to fully power up and steady   Ambulation/Gait Ambulation/Gait assistance: Mod assist Gait Distance (Feet): 200 Feet Assistive device: 1 person hand held assist Gait Pattern/deviations: Step-through pattern;Decreased stride length;Wide base of support;Trunk flexed     General Gait Details: anterior bias with mod A for safety/balance/fall prevention; relates history of falls, but cannot quantify  Stairs             Wheelchair Mobility    Modified Rankin (Stroke Patients Only)       Balance Overall balance assessment: Needs assistance Sitting-balance support: No upper extremity supported;Feet supported Sitting balance-Leahy Scale: Fair     Standing balance support: Single extremity supported;During functional activity Standing balance-Leahy Scale: Poor Standing balance comment: relies on external support, reaching out for UE support                              Pertinent Vitals/Pain Pain Assessment: No/denies pain    Home Living Family/patient expects to be discharged to:: Private residence Living Arrangements: Alone               Additional Comments: pt limited historian, agitated with questions--further information needed    Prior Function Level of Independence: Independent         Comments: reports independent     Hand Dominance        Extremity/Trunk Assessment   Upper Extremity Assessment Upper Extremity Assessment: Defer to OT evaluation    Lower Extremity Assessment Lower Extremity Assessment: Generalized weakness;RLE deficits/detail;LLE deficits/detail RLE Sensation: decreased light touch;history of peripheral neuropathy LLE Deficits / Details: noted necrotic area on tip of L great toe and in web space, pt relates painful at times LLE Sensation: decreased light touch;history of peripheral neuropathy       Communication   Communication: No difficulties  Cognition Arousal/Alertness: Awake/alert Behavior During Therapy: Agitated Overall Cognitive Status: Impaired/Different from baseline Area of Impairment: Orientation;Attention;Memory;Following commands;Safety/judgement;Awareness;Problem solving  Orientation Level: Disoriented to;Time;Situation Current Attention Level: Sustained Memory: Decreased short-term memory Following Commands: Follows one step commands consistently;Follows one step commands with increased  time;Follows multi-step commands inconsistently Safety/Judgement: Decreased awareness of safety;Decreased awareness of deficits Awareness: Intellectual Problem Solving: Slow processing;Decreased initiation;Difficulty sequencing;Requires verbal cues General Comments: pt disoriented to situation and time (able to state nov 2021 but not day), oriented to place with cueing but consistently reports being "locked up"; follows 1 step commands with increased time; poor awareness to deficits, situation and very easily agitated       General Comments General comments (skin integrity, edema, etc.): Patient perseverating on finding his wallet near end of session and frustrated he cannot find it, RN aware    Exercises     Assessment/Plan    PT Assessment Patient needs continued PT services  PT Problem List Decreased mobility;Decreased safety awareness;Decreased cognition;Decreased balance;Decreased coordination;Decreased strength       PT Treatment Interventions DME instruction;Therapeutic activities;Gait training;Therapeutic exercise;Patient/family education;Balance training;Functional mobility training    PT Goals (Current goals can be found in the Care Plan section)  Acute Rehab PT Goals Patient Stated Goal: to get out of here PT Goal Formulation: With patient Time For Goal Achievement: 06/04/20 Potential to Achieve Goals: Fair    Frequency Min 3X/week   Barriers to discharge Decreased caregiver support      Co-evaluation PT/OT/SLP Co-Evaluation/Treatment: Yes Reason for Co-Treatment: For patient/therapist safety;Necessary to address cognition/behavior during functional activity PT goals addressed during session: Mobility/safety with mobility;Balance OT goals addressed during session: ADL's and self-care       AM-PAC PT "6 Clicks" Mobility  Outcome Measure Help needed turning from your back to your side while in a flat bed without using bedrails?: A Little Help needed moving from  lying on your back to sitting on the side of a flat bed without using bedrails?: A Little Help needed moving to and from a bed to a chair (including a wheelchair)?: A Little Help needed standing up from a chair using your arms (e.g., wheelchair or bedside chair)?: A Little Help needed to walk in hospital room?: A Lot Help needed climbing 3-5 steps with a railing? : A Lot 6 Click Score: 16    End of Session Equipment Utilized During Treatment: Gait belt Activity Tolerance: Patient tolerated treatment well Patient left: in chair;with call bell/phone within reach;with chair alarm set Nurse Communication: Mobility status;Other (comment) (some agitation) PT Visit Diagnosis: Other abnormalities of gait and mobility (R26.89);History of falling (Z91.81);Muscle weakness (generalized) (M62.81);Other symptoms and signs involving the nervous system (R29.898)    Time: 3762-8315 PT Time Calculation (min) (ACUTE ONLY): 22 min   Charges:   PT Evaluation $PT Eval Moderate Complexity: 1 Mod          Cyndi Brynnan Rodenbaugh, PT Acute Rehabilitation Services VVOHY:[phone] [phone] 05/21/2020   Reginia Naas 05/21/2020, 1:19 PM

## 2020-05-22 ENCOUNTER — Encounter (HOSPITAL_COMMUNITY): Payer: Self-pay | Admitting: Internal Medicine

## 2020-05-22 ENCOUNTER — Other Ambulatory Visit: Payer: Self-pay

## 2020-05-22 LAB — GLUCOSE, CAPILLARY
Glucose-Capillary: 117 mg/dL — ABNORMAL HIGH (ref 70–99)
Glucose-Capillary: 151 mg/dL — ABNORMAL HIGH (ref 70–99)
Glucose-Capillary: 98 mg/dL (ref 70–99)

## 2020-05-22 NOTE — Progress Notes (Signed)
Date: 05/22/2020 Patient: Brian Huynh Admitted: 05/19/2020  4:58 AM Attending Provider: Domenic Polite, MD  Brian Huynh has made the decision to leave against the advice of Domenic Polite, MD.  He  has been informed and understands the inherent risks, including death.  He has decided to accept the responsibility for this decision.   Brian Huynh  has signed the Leaving Against Medical Advice form prior to leaving the unit.  Brian Huynh 05/22/2020

## 2020-05-23 ENCOUNTER — Encounter (HOSPITAL_COMMUNITY): Payer: Self-pay

## 2020-05-24 LAB — CULTURE, BLOOD (ROUTINE X 2)
Culture: NO GROWTH
Culture: NO GROWTH
Special Requests: ADEQUATE
Special Requests: ADEQUATE

## 2021-01-21 DEATH — deceased
